# Patient Record
Sex: Female | Born: 1935
Health system: Southern US, Community
[De-identification: ages and names within clinical notes are randomized; demographics above are authoritative.]

## PROBLEM LIST (undated history)

## (undated) DIAGNOSIS — K5900 Constipation, unspecified: Secondary | ICD-10-CM

## (undated) DIAGNOSIS — Z86010 Personal history of colonic polyps: Secondary | ICD-10-CM

## (undated) DIAGNOSIS — C541 Malignant neoplasm of endometrium: Secondary | ICD-10-CM

## (undated) DIAGNOSIS — L820 Inflamed seborrheic keratosis: Secondary | ICD-10-CM

## (undated) DIAGNOSIS — Z800 Family history of malignant neoplasm of digestive organs: Secondary | ICD-10-CM

## (undated) DIAGNOSIS — Z923 Personal history of irradiation: Secondary | ICD-10-CM

## (undated) DIAGNOSIS — H9190 Unspecified hearing loss, unspecified ear: Secondary | ICD-10-CM

## (undated) DIAGNOSIS — J309 Allergic rhinitis, unspecified: Secondary | ICD-10-CM

## (undated) DIAGNOSIS — N952 Postmenopausal atrophic vaginitis: Secondary | ICD-10-CM

## (undated) DIAGNOSIS — N939 Abnormal uterine and vaginal bleeding, unspecified: Secondary | ICD-10-CM

## (undated) DIAGNOSIS — K5730 Diverticulosis of large intestine without perforation or abscess without bleeding: Secondary | ICD-10-CM

## (undated) HISTORY — DX: Diverticulosis of large intestine without perforation or abscess without bleeding: K57.30

## (undated) HISTORY — DX: Personal history of colonic polyps: Z86.010

## (undated) HISTORY — DX: Allergic rhinitis, unspecified: J30.9

## (undated) HISTORY — DX: Family history of malignant neoplasm of digestive organs: Z80.0

## (undated) HISTORY — DX: Postmenopausal atrophic vaginitis: N95.2

## (undated) HISTORY — DX: Unspecified hearing loss, unspecified ear: H91.90

## (undated) HISTORY — PX: RHINOPLASTY: SHX2354

## (undated) HISTORY — PX: COSMETIC SURGERY: SHX468

## (undated) HISTORY — PX: DILATION AND CURETTAGE OF UTERUS: SHX78

## (undated) HISTORY — PX: EYELID IMPLANT: SHX5294

## (undated) HISTORY — DX: Abnormal uterine and vaginal bleeding, unspecified: N93.9

## (undated) HISTORY — DX: Personal history of irradiation: Z92.3

## (undated) HISTORY — DX: Constipation, unspecified: K59.00

## (undated) HISTORY — PX: BREAST BIOPSY: SHX20

## (undated) HISTORY — PX: TONSILECTOMY/ADENOIDECTOMY WITH MYRINGOTOMY: SHX6125

## (undated) HISTORY — DX: Inflamed seborrheic keratosis: L82.0

---

## 1998-09-13 ENCOUNTER — Other Ambulatory Visit: Admission: RE | Admit: 1998-09-13 | Discharge: 1998-09-13 | Payer: Self-pay | Admitting: Obstetrics and Gynecology

## 1999-09-12 ENCOUNTER — Encounter: Admission: RE | Admit: 1999-09-12 | Discharge: 1999-09-12 | Payer: Self-pay | Admitting: Obstetrics and Gynecology

## 1999-09-12 ENCOUNTER — Encounter: Payer: Self-pay | Admitting: Obstetrics and Gynecology

## 1999-09-16 ENCOUNTER — Other Ambulatory Visit: Admission: RE | Admit: 1999-09-16 | Discharge: 1999-09-16 | Payer: Self-pay | Admitting: Obstetrics and Gynecology

## 2000-08-27 ENCOUNTER — Encounter: Admission: RE | Admit: 2000-08-27 | Discharge: 2000-08-27 | Payer: Self-pay | Admitting: Obstetrics and Gynecology

## 2000-08-27 ENCOUNTER — Encounter: Payer: Self-pay | Admitting: Obstetrics and Gynecology

## 2000-09-24 ENCOUNTER — Other Ambulatory Visit: Admission: RE | Admit: 2000-09-24 | Discharge: 2000-09-24 | Payer: Self-pay | Admitting: Obstetrics and Gynecology

## 2001-05-11 ENCOUNTER — Emergency Department (HOSPITAL_COMMUNITY): Admission: EM | Admit: 2001-05-11 | Discharge: 2001-05-11 | Payer: Self-pay | Admitting: Emergency Medicine

## 2001-05-14 ENCOUNTER — Encounter (HOSPITAL_COMMUNITY): Admission: RE | Admit: 2001-05-14 | Discharge: 2001-08-12 | Payer: Self-pay | Admitting: Emergency Medicine

## 2001-08-30 ENCOUNTER — Encounter: Admission: RE | Admit: 2001-08-30 | Discharge: 2001-08-30 | Payer: Self-pay | Admitting: Family Medicine

## 2001-08-30 ENCOUNTER — Encounter: Payer: Self-pay | Admitting: Family Medicine

## 2001-09-16 ENCOUNTER — Other Ambulatory Visit: Admission: RE | Admit: 2001-09-16 | Discharge: 2001-09-16 | Payer: Self-pay | Admitting: Family Medicine

## 2002-09-12 ENCOUNTER — Encounter: Payer: Self-pay | Admitting: Family Medicine

## 2002-09-12 ENCOUNTER — Encounter: Admission: RE | Admit: 2002-09-12 | Discharge: 2002-09-12 | Payer: Self-pay | Admitting: Family Medicine

## 2002-12-22 ENCOUNTER — Other Ambulatory Visit: Admission: RE | Admit: 2002-12-22 | Discharge: 2002-12-22 | Payer: Self-pay | Admitting: Family Medicine

## 2003-09-21 ENCOUNTER — Encounter: Admission: RE | Admit: 2003-09-21 | Discharge: 2003-09-21 | Payer: Self-pay | Admitting: Family Medicine

## 2004-01-01 ENCOUNTER — Other Ambulatory Visit: Admission: RE | Admit: 2004-01-01 | Discharge: 2004-01-01 | Payer: Self-pay | Admitting: Family Medicine

## 2004-09-25 ENCOUNTER — Ambulatory Visit: Payer: Self-pay | Admitting: Family Medicine

## 2004-11-13 ENCOUNTER — Encounter: Admission: RE | Admit: 2004-11-13 | Discharge: 2004-11-13 | Payer: Self-pay | Admitting: Family Medicine

## 2004-12-26 ENCOUNTER — Ambulatory Visit: Payer: Self-pay | Admitting: Family Medicine

## 2005-01-07 ENCOUNTER — Other Ambulatory Visit: Admission: RE | Admit: 2005-01-07 | Discharge: 2005-01-07 | Payer: Self-pay | Admitting: Family Medicine

## 2005-01-07 ENCOUNTER — Ambulatory Visit: Payer: Self-pay | Admitting: Family Medicine

## 2005-09-03 ENCOUNTER — Ambulatory Visit: Payer: Self-pay | Admitting: Family Medicine

## 2005-09-13 ENCOUNTER — Ambulatory Visit: Payer: Self-pay | Admitting: Family Medicine

## 2005-11-11 ENCOUNTER — Ambulatory Visit: Payer: Self-pay | Admitting: Gastroenterology

## 2005-11-18 ENCOUNTER — Encounter: Admission: RE | Admit: 2005-11-18 | Discharge: 2005-11-18 | Payer: Self-pay | Admitting: Family Medicine

## 2005-11-19 ENCOUNTER — Encounter (INDEPENDENT_AMBULATORY_CARE_PROVIDER_SITE_OTHER): Payer: Self-pay | Admitting: Specialist

## 2005-11-19 ENCOUNTER — Ambulatory Visit: Payer: Self-pay | Admitting: Gastroenterology

## 2005-11-19 DIAGNOSIS — Z860100 Personal history of colon polyps, unspecified: Secondary | ICD-10-CM | POA: Insufficient documentation

## 2005-11-19 HISTORY — DX: Personal history of colon polyps, unspecified: Z86.0100

## 2005-11-19 HISTORY — DX: Personal history of colonic polyps: Z86.010

## 2006-01-08 ENCOUNTER — Other Ambulatory Visit: Admission: RE | Admit: 2006-01-08 | Discharge: 2006-01-08 | Payer: Self-pay | Admitting: Family Medicine

## 2006-01-08 ENCOUNTER — Encounter: Payer: Self-pay | Admitting: Family Medicine

## 2006-01-08 ENCOUNTER — Ambulatory Visit: Payer: Self-pay | Admitting: Family Medicine

## 2006-01-13 ENCOUNTER — Ambulatory Visit: Payer: Self-pay | Admitting: Internal Medicine

## 2006-11-27 ENCOUNTER — Encounter: Admission: RE | Admit: 2006-11-27 | Discharge: 2006-11-27 | Payer: Self-pay | Admitting: Family Medicine

## 2007-01-14 ENCOUNTER — Encounter: Payer: Self-pay | Admitting: Family Medicine

## 2007-01-14 ENCOUNTER — Ambulatory Visit: Payer: Self-pay | Admitting: Family Medicine

## 2007-01-14 ENCOUNTER — Other Ambulatory Visit: Admission: RE | Admit: 2007-01-14 | Discharge: 2007-01-14 | Payer: Self-pay | Admitting: Family Medicine

## 2007-01-14 LAB — CONVERTED CEMR LAB
ALT: 32 units/L (ref 0–40)
Albumin: 3.2 g/dL — ABNORMAL LOW (ref 3.5–5.2)
Alkaline Phosphatase: 64 units/L (ref 39–117)
BUN: 6 mg/dL (ref 6–23)
Calcium: 9.3 mg/dL (ref 8.4–10.5)
Cholesterol: 166 mg/dL (ref 0–200)
HCT: 33.2 % — ABNORMAL LOW (ref 36.0–46.0)
Hgb A1c MFr Bld: 6 % (ref 4.6–6.0)
Lymphocytes Relative: 14.5 % (ref 12.0–46.0)
MCV: 94 fL (ref 78.0–100.0)
Monocytes Absolute: 0.6 10*3/uL (ref 0.2–0.7)
Monocytes Relative: 6.1 % (ref 3.0–11.0)
Neutro Abs: 7.6 10*3/uL (ref 1.4–7.7)
RDW: 12.7 % (ref 11.5–14.6)
Sodium: 140 meq/L (ref 135–145)
Total Bilirubin: 0.7 mg/dL (ref 0.3–1.2)
Total Protein: 7.5 g/dL (ref 6.0–8.3)
Triglycerides: 81 mg/dL (ref 0–149)
VLDL: 16 mg/dL (ref 0–40)

## 2007-03-09 ENCOUNTER — Ambulatory Visit: Payer: Self-pay | Admitting: Family Medicine

## 2007-03-09 LAB — CONVERTED CEMR LAB
Basophils Relative: 1.1 % — ABNORMAL HIGH (ref 0.0–1.0)
Eosinophils Relative: 4.2 % (ref 0.0–5.0)
HCT: 32.8 % — ABNORMAL LOW (ref 36.0–46.0)
MCHC: 35.3 g/dL (ref 30.0–36.0)
MCV: 93.4 fL (ref 78.0–100.0)
Monocytes Absolute: 0.5 10*3/uL (ref 0.2–0.7)
Platelets: 228 10*3/uL (ref 150–400)
RBC: 3.51 M/uL — ABNORMAL LOW (ref 3.87–5.11)

## 2007-03-16 ENCOUNTER — Ambulatory Visit: Payer: Self-pay | Admitting: Family Medicine

## 2007-03-16 LAB — CONVERTED CEMR LAB
Eosinophils Relative: 3.2 % (ref 0.0–5.0)
MCHC: 34.7 g/dL (ref 30.0–36.0)
Monocytes Relative: 7.4 % (ref 3.0–11.0)
Neutrophils Relative %: 64.1 % (ref 43.0–77.0)
RDW: 12.9 % (ref 11.5–14.6)

## 2007-03-23 ENCOUNTER — Ambulatory Visit: Payer: Self-pay | Admitting: Family Medicine

## 2007-07-07 DIAGNOSIS — J309 Allergic rhinitis, unspecified: Secondary | ICD-10-CM

## 2007-07-07 HISTORY — DX: Allergic rhinitis, unspecified: J30.9

## 2007-09-17 ENCOUNTER — Ambulatory Visit: Payer: Self-pay | Admitting: Family Medicine

## 2007-12-22 ENCOUNTER — Encounter: Admission: RE | Admit: 2007-12-22 | Discharge: 2007-12-22 | Payer: Self-pay | Admitting: Family Medicine

## 2008-01-17 ENCOUNTER — Other Ambulatory Visit: Admission: RE | Admit: 2008-01-17 | Discharge: 2008-01-17 | Payer: Self-pay | Admitting: Family Medicine

## 2008-01-17 ENCOUNTER — Encounter: Payer: Self-pay | Admitting: Family Medicine

## 2008-01-17 ENCOUNTER — Ambulatory Visit: Payer: Self-pay | Admitting: Family Medicine

## 2008-01-17 DIAGNOSIS — N952 Postmenopausal atrophic vaginitis: Secondary | ICD-10-CM

## 2008-01-17 HISTORY — DX: Postmenopausal atrophic vaginitis: N95.2

## 2008-01-17 LAB — CONVERTED CEMR LAB
AST: 26 units/L (ref 0–37)
Albumin: 3.8 g/dL (ref 3.5–5.2)
Basophils Relative: 0.8 % (ref 0.0–1.0)
Blood in Urine, dipstick: NEGATIVE
Calcium: 9.6 mg/dL (ref 8.4–10.5)
Eosinophils Absolute: 0.4 10*3/uL (ref 0.0–0.6)
GFR calc Af Amer: 80 mL/min
GFR calc non Af Amer: 66 mL/min
Glucose, Bld: 97 mg/dL (ref 70–99)
HDL: 39.5 mg/dL (ref >39.0)
Hemoglobin: 12.1 g/dL (ref 12.0–15.0)
Ketones, urine, test strip: NEGATIVE
LDL Cholesterol: 127 mg/dL — ABNORMAL HIGH (ref 0–99)
MCHC: 33.6 g/dL (ref 30.0–36.0)
Monocytes Absolute: 0.5 10*3/uL (ref 0.2–0.7)
Monocytes Relative: 8.5 % (ref 3.0–11.0)
Neutro Abs: 3.5 10*3/uL (ref 1.4–7.7)
Neutrophils Relative %: 60.9 % (ref 43.0–77.0)
Nitrite: NEGATIVE
Protein, U semiquant: NEGATIVE
Specific Gravity, Urine: 1.02
VLDL: 23 mg/dL (ref 0–40)
WBC: 5.7 10*3/uL (ref 4.5–10.5)

## 2008-03-20 ENCOUNTER — Ambulatory Visit: Payer: Self-pay | Admitting: Family Medicine

## 2008-03-21 DIAGNOSIS — L820 Inflamed seborrheic keratosis: Secondary | ICD-10-CM | POA: Insufficient documentation

## 2008-03-21 HISTORY — DX: Inflamed seborrheic keratosis: L82.0

## 2008-05-17 ENCOUNTER — Encounter: Payer: Self-pay | Admitting: Family Medicine

## 2008-05-17 ENCOUNTER — Ambulatory Visit: Payer: Self-pay | Admitting: Family Medicine

## 2008-06-08 ENCOUNTER — Telehealth: Payer: Self-pay | Admitting: Family Medicine

## 2008-07-12 DIAGNOSIS — K5730 Diverticulosis of large intestine without perforation or abscess without bleeding: Secondary | ICD-10-CM | POA: Insufficient documentation

## 2008-07-12 HISTORY — DX: Diverticulosis of large intestine without perforation or abscess without bleeding: K57.30

## 2008-07-13 ENCOUNTER — Ambulatory Visit: Payer: Self-pay | Admitting: Gastroenterology

## 2008-07-13 DIAGNOSIS — R1031 Right lower quadrant pain: Secondary | ICD-10-CM | POA: Insufficient documentation

## 2008-07-14 ENCOUNTER — Ambulatory Visit (HOSPITAL_COMMUNITY): Admission: RE | Admit: 2008-07-14 | Discharge: 2008-07-14 | Payer: Self-pay | Admitting: Gastroenterology

## 2008-07-19 ENCOUNTER — Ambulatory Visit: Payer: Self-pay | Admitting: Gastroenterology

## 2008-08-21 ENCOUNTER — Ambulatory Visit: Payer: Self-pay | Admitting: Family Medicine

## 2008-08-21 DIAGNOSIS — Z8679 Personal history of other diseases of the circulatory system: Secondary | ICD-10-CM | POA: Insufficient documentation

## 2008-10-03 ENCOUNTER — Telehealth: Payer: Self-pay | Admitting: Family Medicine

## 2008-12-22 ENCOUNTER — Encounter: Admission: RE | Admit: 2008-12-22 | Discharge: 2008-12-22 | Payer: Self-pay | Admitting: Family Medicine

## 2009-01-25 ENCOUNTER — Encounter: Payer: Self-pay | Admitting: Family Medicine

## 2009-01-25 ENCOUNTER — Other Ambulatory Visit: Admission: RE | Admit: 2009-01-25 | Discharge: 2009-01-25 | Payer: Self-pay | Admitting: Family Medicine

## 2009-01-25 ENCOUNTER — Ambulatory Visit: Payer: Self-pay | Admitting: Family Medicine

## 2009-01-25 LAB — CONVERTED CEMR LAB
Bilirubin Urine: NEGATIVE
Blood in Urine, dipstick: NEGATIVE
Glucose, Urine, Semiquant: NEGATIVE
Ketones, urine, test strip: NEGATIVE
Nitrite: NEGATIVE
Urobilinogen, UA: 0.2

## 2009-01-26 ENCOUNTER — Telehealth: Payer: Self-pay | Admitting: *Deleted

## 2009-02-05 LAB — CONVERTED CEMR LAB
AST: 26 units/L (ref 0–37)
Albumin: 3.8 g/dL (ref 3.5–5.2)
Alkaline Phosphatase: 58 units/L (ref 39–117)
Basophils Absolute: 0.1 10*3/uL (ref 0.0–0.1)
Bilirubin, Direct: 0.1 mg/dL (ref 0.0–0.3)
Calcium: 9.4 mg/dL (ref 8.4–10.5)
Eosinophils Absolute: 0.3 10*3/uL (ref 0.0–0.7)
Eosinophils Relative: 3.4 % (ref 0.0–5.0)
GFR calc non Af Amer: 75.07 mL/min (ref >60)
HCT: 35.5 % — ABNORMAL LOW (ref 36.0–46.0)
HDL: 41.1 mg/dL (ref >39.00)
LDL Cholesterol: 137 mg/dL — ABNORMAL HIGH (ref 0–99)
MCHC: 35.7 g/dL (ref 30.0–36.0)
MCV: 90.5 fL (ref 78.0–100.0)
Monocytes Relative: 6.8 % (ref 3.0–12.0)
RBC: 3.92 M/uL (ref 3.87–5.11)
Sodium: 142 meq/L (ref 135–145)
Total Bilirubin: 0.6 mg/dL (ref 0.3–1.2)
Total CHOL/HDL Ratio: 5
Total Protein: 7.3 g/dL (ref 6.0–8.3)
Triglycerides: 96 mg/dL (ref 0.0–149.0)
VLDL: 19.2 mg/dL (ref 0.0–40.0)
WBC: 7.8 10*3/uL (ref 4.5–10.5)

## 2009-06-18 DIAGNOSIS — K1120 Sialoadenitis, unspecified: Secondary | ICD-10-CM | POA: Insufficient documentation

## 2009-06-19 ENCOUNTER — Ambulatory Visit: Payer: Self-pay | Admitting: Family Medicine

## 2009-08-13 ENCOUNTER — Ambulatory Visit: Payer: Self-pay | Admitting: Family Medicine

## 2009-10-29 ENCOUNTER — Telehealth: Payer: Self-pay | Admitting: Family Medicine

## 2009-12-25 ENCOUNTER — Encounter: Admission: RE | Admit: 2009-12-25 | Discharge: 2009-12-25 | Payer: Self-pay | Admitting: Family Medicine

## 2009-12-31 ENCOUNTER — Ambulatory Visit: Payer: Self-pay | Admitting: Internal Medicine

## 2009-12-31 ENCOUNTER — Telehealth: Payer: Self-pay | Admitting: Gastroenterology

## 2009-12-31 DIAGNOSIS — R1032 Left lower quadrant pain: Secondary | ICD-10-CM | POA: Insufficient documentation

## 2009-12-31 DIAGNOSIS — K5900 Constipation, unspecified: Secondary | ICD-10-CM | POA: Insufficient documentation

## 2009-12-31 HISTORY — DX: Constipation, unspecified: K59.00

## 2010-02-05 ENCOUNTER — Telehealth: Payer: Self-pay | Admitting: *Deleted

## 2010-02-05 ENCOUNTER — Ambulatory Visit: Payer: Self-pay | Admitting: Family Medicine

## 2010-02-05 ENCOUNTER — Other Ambulatory Visit: Admission: RE | Admit: 2010-02-05 | Discharge: 2010-02-05 | Payer: Self-pay | Admitting: Family Medicine

## 2010-02-08 LAB — CONVERTED CEMR LAB
ALT: 23 units/L (ref 0–35)
AST: 24 units/L (ref 0–37)
Albumin: 3.8 g/dL (ref 3.5–5.2)
Alkaline Phosphatase: 48 units/L (ref 39–117)
BUN: 11 mg/dL (ref 6–23)
Basophils Absolute: 0 10*3/uL (ref 0.0–0.1)
CO2: 31 meq/L (ref 19–32)
Calcium: 9.2 mg/dL (ref 8.4–10.5)
Eosinophils Absolute: 0.2 10*3/uL (ref 0.0–0.7)
Glucose, Bld: 94 mg/dL (ref 70–99)
HCT: 34.7 % — ABNORMAL LOW (ref 36.0–46.0)
HDL: 44.3 mg/dL (ref >39.00)
Lymphocytes Relative: 22.4 % (ref 12.0–46.0)
MCV: 95.5 fL (ref 78.0–100.0)
Neutro Abs: 3.8 10*3/uL (ref 1.4–7.7)
Neutrophils Relative %: 64.8 % (ref 43.0–77.0)
Platelets: 201 10*3/uL (ref 150.0–400.0)
Potassium: 4 meq/L (ref 3.5–5.1)
RBC: 3.63 M/uL — ABNORMAL LOW (ref 3.87–5.11)
RDW: 12.1 % (ref 11.5–14.6)
Sodium: 145 meq/L (ref 135–145)
Total Bilirubin: 0.3 mg/dL (ref 0.3–1.2)
Total CHOL/HDL Ratio: 4
VLDL: 16.6 mg/dL (ref 0.0–40.0)

## 2010-03-22 ENCOUNTER — Telehealth: Payer: Self-pay | Admitting: Family Medicine

## 2010-05-17 ENCOUNTER — Ambulatory Visit: Payer: Self-pay | Admitting: Family Medicine

## 2010-08-07 ENCOUNTER — Ambulatory Visit: Payer: Self-pay | Admitting: Family Medicine

## 2010-09-10 ENCOUNTER — Encounter: Payer: Self-pay | Admitting: Family Medicine

## 2010-12-03 ENCOUNTER — Other Ambulatory Visit: Payer: Self-pay | Admitting: Family Medicine

## 2010-12-03 DIAGNOSIS — Z1239 Encounter for other screening for malignant neoplasm of breast: Secondary | ICD-10-CM

## 2010-12-12 ENCOUNTER — Telehealth: Payer: Self-pay | Admitting: *Deleted

## 2010-12-12 NOTE — Miscellaneous (Signed)
Summary: BONE DENSITY  Clinical Lists Changes  Orders: Added new Test order of T-Bone Densitometry (77080) - Signed Added new Test order of T-Lumbar Vertebral Assessment (77082) - Signed 

## 2010-12-12 NOTE — Progress Notes (Signed)
Summary: Shingles Vaccine  Phone Note Call from Patient Call back at [phone]   Caller: Patient Summary of Call: Would like to speak w/ nurse about the shingles vaccine. Initial call taken by: Trixie Dredge,  Mar 22, 2010 10:49 AM  Follow-up for Phone Call        informed patient that shingles vaccines are not avaiable at this time left message on machine  Follow-up by: Kern Reap CMA Duncan Dull),  Mar 22, 2010 3:32 PM

## 2010-12-12 NOTE — Assessment & Plan Note (Signed)
Summary: FLU SHOT/NJR  Nurse Visit  CC: Flu shot./kb   Allergies: 1)  ! Sulfa  Orders Added: 1)  Flu Vaccine 63yrs + MEDICARE PATIENTS [Q2039] 2)  Administration Flu vaccine - MCR [G0008]            Flu Vaccine Consent Questions     Do you have a history of severe allergic reactions to this vaccine? no    Any prior history of allergic reactions to egg and/or gelatin? no    Do you have a sensitivity to the preservative Thimersol? no    Do you have a past history of Guillan-Barre Syndrome? no    Do you currently have an acute febrile illness? no    Have you ever had a severe reaction to latex? no    Vaccine information given and explained to patient? yes    Are you currently pregnant? no    Lot Number: [id number]   Exp Date:05/10/2011   Site Given  Left Deltoid IMu

## 2010-12-12 NOTE — Telephone Encounter (Signed)
Left message to call back with elevated pulse rate, and more details about her reason for appointment request.

## 2010-12-12 NOTE — Progress Notes (Signed)
Summary: Ask a question  Phone Note Call from Patient Call back at cell [phone]   Call For: Dr Jarold Motto Reason for Call: Talk to Nurse Summary of Call: Wants to ask a question to nurse directly. Initial call taken by: Leanor Kail [facility],  December 31, 2009 8:33 AM  Follow-up for Phone Call        Pt C/O LLU pain over the weekend.  Pain was severe at times.  Feels better today but sheis going out of town tomorrow and would like to be checked.  Appt sch with Mike Gip, PA. Follow-up by: Ashok Cordia RN,  December 31, 2009 8:42 AM

## 2010-12-12 NOTE — Telephone Encounter (Signed)
Pulse rate 107-110  On Zyrtec D at one point, but none for 2 days. Should she come in for EKG?  Per Pt.

## 2010-12-12 NOTE — Assessment & Plan Note (Signed)
Summary: LLQ pain/dfs   History of Present Illness Visit Type: Follow-up Visit Primary GI MD: Sheryn Bison MD FACP FAGA Primary Provider:  Governor Specking, MD Chief Complaint: LLQ abd discomfort that started last Friday that increasingly got better throughout the weekend. Pt states she started a high fiber diet and drank more fluids. Pt states she several small BM's over the weekend and then yesterday had a significant BM. Pt states the pain is almost gone away and is feeling somewhat better. Pt denies any fever. History of Present Illness:   76 YO FEMALE KNOWN TO DR. PATTERSON WITH HX OF DIVERTICULOSIS AND ADENOMATOUS COLON POLYPS.LAST COLONOSCOPY WAS IN 1/07-ONE ADENOMATOUS POLYPD REMOVED. SHE COMES IN TODAY WITH C/O LLQ PAIN,ONSET LAST WEEK. SHE SAYS SHE WOKE UP WITH PAIN ON FRIDAY 2/18,AND HAD PERSISTENT PAIN THRU 2/19,THEN FELT BETTER YESTERDAY. SHE HAD BEEN SOMEWHAT CONSTIPATED,STARTED PUSHING FLUIDS AND FIBER AND AFTER HAVING SOME BM'S HAS FELT BETTER. SHE DENIES FEVER,CHILLS ETC. NO NAUSEA,VOMITING.NO MELENA OR HEME. SHE WAS CONCERNED AS SHE IS GOING OUT OF TOWN THIS WEEK. PAIN WAS ACHY,NO DYSURIA.   GI Review of Systems    Reports abdominal pain.     Location of  Abdominal pain: LLQ.    Denies acid reflux, belching, bloating, chest pain, dysphagia with liquids, dysphagia with solids, heartburn, loss of appetite, nausea, vomiting, vomiting blood, weight loss, and  weight gain.      Reports constipation and  diverticulosis.     Denies anal fissure, black tarry stools, change in bowel habit, diarrhea, fecal incontinence, heme positive stool, hemorrhoids, irritable bowel syndrome, jaundice, light color stool, liver problems, rectal bleeding, and  rectal pain.    Current Medications (verified): 1)  Multivitamins   Tabs (Multiple Vitamin) .... Take 1 Tablet By Mouth Once A Day 2)  Calcium Citrate-Vitamin D 1500-200 Mg-Unit Tabs (Calcium Citrate-Vitamin D) .... Take 1 Tablet By Mouth Once A  Day 3)  Nasonex 50 Mcg/act Susp (Mometasone Furoate) .... Insert 1 Puff Into Each Nostril Once Daily 4)  Astelin 137 Mcg/spray Soln (Azelastine Hcl) .... Insert 1 Spray in Each Nostril Once Daily 5)  Osteo Bi-Flex Regular Strength 250-200 Mg Tabs (Glucosamine-Chondroitin) .... Take 1 Tablet By Mouth Once A Day 6)  Premarin 0.625 Mg/gm Crea (Estrogens, Conjugated) .... Apply 0.5 G Twice Weekly  Allergies (verified): 1)  ! Sulfa  Past History:  Past Medical History: Reviewed history from 07/13/2008 and no changes required. Allergic rhinitis bilateral cataracts, and lens implants childbirth x 2 rhinoplasty D&C  Current Problems:  ABDOMINAL PAIN, RIGHT LOWER QUADRANT (ICD-789.03) NEOPLASM, COLON, FAMILY HX (ICD-V16.0) DIVERTICULOSIS, COLON (ICD-562.10) COLONIC POLYPS, ADENOMATOUS, HX OF (ICD-V12.72) SEBORRHEIC KERATOSIS, INFLAMED (ICD-702.11) SENILE VAGINITIS (ICD-627.3) FAMILY HISTORY OF CAD FEMALE 1ST DEGREE RELATIVE <50 (ICD-V17.3) ALLERGIC RHINITIS (ICD-477.9)  Past Surgical History: Reviewed history from 07/13/2008 and no changes required. Childbirth x 2 Rhinoplasty D/C R + L implants-eyes Colonoscopy-11/19/2005  Family History: Reviewed history from 07/13/2008 and no changes required. Family History of CAD Female 1st degree relative <50 Family History Other cancer-Colon: Mother  Social History: Former Smoker-stopped 45 years ago Alcohol use-no Widow and remarried Occupation: Retired Daily Caffeine Use-1/2 cup daily Illicit Drug Use - no Patient gets regular exercise.  Review of Systems  The patient denies allergy/sinus, anemia, anxiety-new, arthritis/joint pain, back pain, blood in urine, breast changes/lumps, change in vision, confusion, cough, coughing up blood, depression-new, fainting, fatigue, fever, headaches-new, hearing problems, heart murmur, heart rhythm changes, itching, menstrual pain, muscle pains/cramps, night sweats, nosebleeds, pregnancy symptoms,  shortness of breath, skin rash, sleeping problems, sore throat, swelling of feet/legs, swollen lymph glands, thirst - excessive , urination - excessive , urination changes/pain, urine leakage, vision changes, and voice change.         ROS OTHERWISE AS IN HPI  Vital Signs:  Patient profile:   76 year old female Height:      68 inches Weight:      157.13 pounds BMI:     23.98 Pulse rate:   100 / minute Pulse rhythm:   regular BP sitting:   128 / 70  (left arm) Cuff size:   regular  Vitals Entered By: Christie Nottingham CMA Duncan Dull) (December 31, 2009 10:19 AM)  Physical Exam  General:  Well developed, well nourished, no acute distress. Head:  Normocephalic and atraumatic. Eyes:  PERRLA, no icterus. Lungs:  Clear throughout to auscultation. Heart:  Regular rate and rhythm; no murmurs, rubs,  or bruits. Abdomen:  SOFT, NONTENDER, NO MASS OR HSM,BS+ Rectal:  NOT DONE Extremities:  No clubbing, cyanosis, edema or deformities noted. Neurologic:  Alert and  oriented x4;  grossly normal neurologically. Psych:  Alert and cooperative. Normal mood and affect.   Impression & Recommendations:  Problem # 1:  ABDOMINAL PAIN-LLQ (ICD-789.04) Assessment New 76 YO FEMALE WITH HX OF DIVERTICULOSIS AND ADENOMATOUS COLON POLYPS,WITH 2 DAY HX OF LLQ PAIN WHICH HAS IMPROVED. SUSPECT SHE DID HAVE SOME LOW GRADE DIVERTICULITIS.  CONTINUE LIBERAL FLUIDS,HIGH FIBER DIET;ADD BENEFIBER DAILY OR FIGS/PRUNES DAILY WILL CALL IN  CIPRO 500 MG TWICE DAILY X 7 DAYS-SHE WILL HAVE IT ON HAND IF SXS RECUR OVER THIS NEXT WEEK WHILE SHE IS OUT OF TOWN FOLLOW UP WITH DR. PATTERSON AS NEEDED.  Problem # 2:  NEOPLASM, COLON, FAMILY HX (ICD-V16.0) Assessment: Comment Only DUE FOR FOLLOW UP COLONOSCOPY1/2012  Problem # 3:  DIVERTICULOSIS, COLON (ICD-562.10) Assessment: Comment Only  Problem # 4:  COLONIC POLYPS, ADENOMATOUS, HX OF (ICD-V12.72) Assessment: Comment Only DUE FOR FOLLOW UP COLONOSCOPY 11/2010  Patient  Instructions: 1)  Pick up your prescription at your pharmacy. 2)  Start Benefiber once daily. 3)  Please schedule a follow-up appointment as needed with Dr. Jarold Motto.  4)  Copy sent to : Kelle Darting, MD 5)  The medication list was reviewed and reconciled.  All changed / newly prescribed medications were explained.  A complete medication list was provided to the patient / caregiver. Prescriptions: CIPRO 500 MG TABS (CIPROFLOXACIN HCL) one tablet by mouth two times a day  #14 x 0   Entered by:   Lowry Ram NCMA   Authorized by:   Sammuel Cooper PA-c   Signed by:   Lowry Ram NCMA on 12/31/2009   Method used:   Electronically to        [facility] [phone]* (retail)       [address] [place], [place]  [address]       Ph: [phone] or [phone]       Fax: [phone]   RxID:   [id number]

## 2010-12-12 NOTE — Assessment & Plan Note (Signed)
Summary: shingles shot with Rachel//ccm  Nurse Visit   Allergies: 1)  ! Sulfa  Immunizations Administered:  Zostavax # 1:    Vaccine Type: Zostavax    Site: right deltoid    Mfr: Merck    Dose: 0.65    Route: Graves    Given by: Kern Reap CMA (AAMA)    Exp. Date: 06/05/2011    Lot #: [id number]    Physician counseled: yes  Orders Added: 1)  Zoster (Shingles) Vaccine Live [90736] 2)  Admin 1st Vaccine [phone]

## 2010-12-12 NOTE — Progress Notes (Signed)
Summary: shingles vaccine  Phone Note Call from Patient   Caller: Patient Call For: Roderick Pee MD Summary of Call: Pt would like [facility] Contras to order the shingles vaccine, please. [phone] Initial call taken by: Lynann Beaver CMA,  February 05, 2010 3:45 PM  Follow-up for Phone Call        vaccine ordered and will call patient when vaccine comes to office. Follow-up by: Kern Reap CMA Duncan Dull),  February 08, 2010 5:17 PM

## 2010-12-12 NOTE — Assessment & Plan Note (Signed)
Summary: EMP/PT COMING IN FASTING/CJR   Vital Signs:  Patient profile:   76 year old female Height:      68 inches Weight:      157 pounds Temp:     98.0 degrees F oral BP sitting:   120 / 80  (left arm) Cuff size:   regular  Vitals Entered By: Kern Reap CMA Duncan Dull) (February 05, 2010 10:47 AM) CC: cpx Is Patient Diabetic? No Pain Assessment Patient in pain? no        Primary Care Provider:   Governor Specking, MD  CC:  cpx.  History of Present Illness:  Sheila Armstrong is a 76 year old, married female, nonsmoker, who comes in today for evaluation of allergic rhinitis postmenopausal vaginal dryness and a general physical exam.  For her allergic rhinitis.  She uses steroid nasal spray and Astelin nasal spray.  Uses Premarin vaginal cream once weekly for vaginal dryness.  She has a history of thoracic scoliosis.  She recently went to see her orthopedist.  They recommend she take an anti-inflammatory, however, she declined, because she's had a previous GI bleed from aspirin.  Advised only to take Tylenol in the future.  Her past medical history, social history, family history reviewed in detail.  No changes.  She continues to remain physically active.  She walks 30 minutes daily.  Her mood is good.  Hearing is normal with her hearing aids.  Risk of fall is minimal home safety reviewed.  Height weight, normal.  Visual acuity normal routine screening for glaucoma annually.  She was counseled concerning diet, exercise, and medication.  Appropriate labs will be ordered.  She gets routine eye care and dental care.  Colonoscopy done in GI.  She does BSE monthly and gets any mammography.  Tetanus 2002, seasonal flu 2010, Pneumovax 2010, contemplating shingles  Allergies: 1)  ! Sulfa  Past History:  Past medical, surgical, family and social histories (including risk factors) reviewed, and no changes noted (except as noted below).  Past Medical History: Reviewed history from 07/13/2008 and no  changes required. Allergic rhinitis bilateral cataracts, and lens implants childbirth x 2 rhinoplasty D&C  Current Problems:  ABDOMINAL PAIN, RIGHT LOWER QUADRANT (ICD-789.03) NEOPLASM, COLON, FAMILY HX (ICD-V16.0) DIVERTICULOSIS, COLON (ICD-562.10) COLONIC POLYPS, ADENOMATOUS, HX OF (ICD-V12.72) SEBORRHEIC KERATOSIS, INFLAMED (ICD-702.11) SENILE VAGINITIS (ICD-627.3) FAMILY HISTORY OF CAD FEMALE 1ST DEGREE RELATIVE <50 (ICD-V17.3) ALLERGIC RHINITIS (ICD-477.9)  Past Surgical History: Reviewed history from 07/13/2008 and no changes required. Childbirth x 2 Rhinoplasty D/C R + L implants-eyes Colonoscopy-11/19/2005  Family History: Reviewed history from 07/13/2008 and no changes required. Family History of CAD Female 1st degree relative <50 Family History Other cancer-Colon: Mother  Social History: Reviewed history from 12/31/2009 and no changes required. Former Smoker-stopped 45 years ago Alcohol use-no Widow and remarried Occupation: Retired Daily Caffeine Use-1/2 cup daily Illicit Drug Use - no Patient gets regular exercise.  Review of Systems      See HPI  Physical Exam  General:  Well-developed,well-nourished,in no acute distress; alert,appropriate and cooperative throughout examination Head:  Normocephalic and atraumatic without obvious abnormalities. No apparent alopecia or balding. Eyes:  No corneal or conjunctival inflammation noted. EOMI. Perrla. Funduscopic exam benign, without hemorrhages, exudates or papilledema. Vision grossly normal. Ears:  External ear exam shows no significant lesions or deformities.  Otoscopic examination reveals clear canals, tympanic membranes are intact bilaterally without bulging, retraction, inflammation or discharge. Hearing is grossly normal bilaterally. Nose:  External nasal examination shows no deformity or inflammation. Nasal mucosa are  pink and moist without lesions or exudates. Mouth:  Oral mucosa and oropharynx without  lesions or exudates.  Teeth in good repair. Neck:  No deformities, masses, or tenderness noted. Chest Wall:  No deformities, masses, or tenderness noted. Breasts:  No mass, nodules, thickening, tenderness, bulging, retraction, inflamation, nipple discharge or skin changes noted.   Lungs:  Normal respiratory effort, chest expands symmetrically. Lungs are clear to auscultation, no crackles or wheezes. Heart:  Normal rate and regular rhythm. S1 and S2 normal without gallop, murmur, click, rub or other extra sounds. Abdomen:  Bowel sounds positive,abdomen soft and non-tender without masses, organomegaly or hernias noted. Rectal:  No external abnormalities noted. Normal sphincter tone. No rectal masses or tenderness. Genitalia:  Pelvic Exam:        External: normal female genitalia without lesions or masses        Vagina: normal without lesions or masses        Cervix: normal without lesions or masses        Adnexa: normal bimanual exam without masses or fullness        Uterus: normal by palpation        Pap smear: performed Msk:  10 to 15 degrees of thoracic scoliosis Pulses:  R and L carotid,radial,femoral,dorsalis pedis and posterior tibial pulses are full and equal bilaterally Extremities:  No clubbing, cyanosis, edema, or deformity noted with normal full range of motion of all joints.   Neurologic:  No cranial nerve deficits noted. Station and gait are normal. Plantar reflexes are down-going bilaterally. DTRs are symmetrical throughout. Sensory, motor and coordinative functions appear intact. Skin:  Intact without suspicious lesions or rashes Cervical Nodes:  No lymphadenopathy noted Axillary Nodes:  No palpable lymphadenopathy Inguinal Nodes:  No significant adenopathy Psych:  Cognition and judgment appear intact. Alert and cooperative with normal attention span and concentration. No apparent delusions, illusions, hallucinations   Impression & Recommendations:  Problem # 1:  SENILE  VAGINITIS (ICD-627.3) Assessment Improved  Her updated medication list for this problem includes:    Premarin 0.625 Mg/gm Crea (Estrogens, conjugated) .Marland Kitchen... Apply 0.5 g twice weekly  Orders: Venipuncture (16109) TLB-Lipid Panel (80061-LIPID) TLB-BMP (Basic Metabolic Panel-BMET) (80048-METABOL) TLB-CBC Platelet - w/Differential (85025-CBCD) TLB-Hepatic/Liver Function Pnl (80076-HEPATIC) TLB-TSH (Thyroid Stimulating Hormone) (60454-UJW) Prescription Created Electronically [phone]) UA Dipstick w/o Micro (automated)  (81003) Subsequent annual wellness visit with prevention plan (N8295)  Problem # 2:  ALLERGIC RHINITIS (ICD-477.9) Assessment: Improved  Her updated medication list for this problem includes:    Nasonex 50 Mcg/act Susp (Mometasone furoate) ..... Insert 1 puff into each nostril once daily    Astelin 137 Mcg/spray Soln (Azelastine hcl) ..... Insert 1 spray in each nostril once daily  Orders: Venipuncture (62130) TLB-Lipid Panel (80061-LIPID) TLB-BMP (Basic Metabolic Panel-BMET) (80048-METABOL) TLB-CBC Platelet - w/Differential (85025-CBCD) TLB-Hepatic/Liver Function Pnl (80076-HEPATIC) TLB-TSH (Thyroid Stimulating Hormone) (86578-ION) Prescription Created Electronically [phone]) UA Dipstick w/o Micro (automated)  (81003) Subsequent annual wellness visit with prevention plan (W4132)  Problem # 3:  Preventive Health Care (ICD-V70.0) Assessment: Unchanged  Orders: Venipuncture (44010) TLB-Lipid Panel (80061-LIPID) TLB-BMP (Basic Metabolic Panel-BMET) (80048-METABOL) TLB-CBC Platelet - w/Differential (85025-CBCD) TLB-Hepatic/Liver Function Pnl (80076-HEPATIC) TLB-TSH (Thyroid Stimulating Hormone) (27253-GUY) Prescription Created Electronically [phone]) UA Dipstick w/o Micro (automated)  (81003) Subsequent annual wellness visit with prevention plan (Q2595) EKG w/ Interpretation (93000)  Complete Medication List: 1)  Multivitamins Tabs (Multiple vitamin) .... Take 1  tablet by mouth once a day 2)  Calcium Citrate-vitamin D 1500-200 Mg-unit Tabs (Calcium citrate-vitamin d) .Marland KitchenMarland KitchenMarland Kitchen  Take 1 tablet by mouth once a day 3)  Nasonex 50 Mcg/act Susp (Mometasone furoate) .... Insert 1 puff into each nostril once daily 4)  Astelin 137 Mcg/spray Soln (Azelastine hcl) .... Insert 1 spray in each nostril once daily 5)  Osteo Bi-flex Regular Strength 250-200 Mg Tabs (Glucosamine-chondroitin) .... Take 1 tablet by mouth once a day 6)  Premarin 0.625 Mg/gm Crea (Estrogens, conjugated) .... Apply 0.5 g twice weekly 7)  Benefiber Pack (Guar gum) .... One once daily  Patient Instructions: 1)  Please schedule a follow-up appointment in 1 year. 2)  It is important that you exercise regularly at least 20 minutes 5 times a week. If you develop chest pain, have severe difficulty breathing, or feel very tired , stop exercising immediately and seek medical attention. 3)  Schedule your mammogram. 4)  Schedule a colonoscopy/sigmoidoscopy to help detect colon cancer. 5)  Take calcium +Vitamin D daily. Prescriptions: PREMARIN 0.625 MG/GM CREA (ESTROGENS, CONJUGATED) apply 0.5 g twice weekly  #3 tubs x 3   Entered and Authorized by:   Roderick Pee MD   Signed by:   Roderick Pee MD on 02/05/2010   Method used:   Electronically to        [facility] [phone]* (retail)       [address] [place], [place]  [address]       Ph: [phone] or [phone]       Fax: [phone]   RxID:   [id number] ASTELIN 137 MCG/SPRAY SOLN (AZELASTINE HCL) Insert 1 spray in each nostril once daily  #2 units x 6   Entered and Authorized by:   Roderick Pee MD   Signed by:   Roderick Pee MD on 02/05/2010   Method used:   Electronically to        [facility] [phone]* (retail)       [address] [place], [place]  [address]       Ph: [phone] or [phone]       Fax: [phone]   RxID:   [id number] NASONEX 50 MCG/ACT SUSP (MOMETASONE FUROATE) Insert 1 puff into  each nostril once daily  #2 units x 6   Entered and Authorized by:   Roderick Pee MD   Signed by:   Roderick Pee MD on 02/05/2010   Method used:   Electronically to        [facility] [phone]* (retail)       [address] [place], [place]  [address]       Ph: [phone] or [phone]       Fax: [phone]   RxID:   [id number]     Appended Document: EMP/PT COMING IN FASTING/CJR  Laboratory Results   Urine Tests    Routine Urinalysis   Color: yellow Appearance: Clear Glucose: negative   (Normal Range: Negative) Bilirubin: negative   (Normal Range: Negative) Ketone: negative   (Normal Range: Negative) Spec. Gravity: 1.010   (Normal Range: 1.003-1.035) Blood: trace-lysed   (Normal Range: Negative) pH: 6.5   (Normal Range: 5.0-8.0) Protein: negative   (Normal Range: Negative) Urobilinogen: 0.2   (Normal Range: 0-1) Nitrite: negative   (Normal Range: Negative) Leukocyte Esterace: negative   (Normal Range: Negative)    Comments: Rita Ohara  February 05, 2010 1:36 PM

## 2010-12-13 ENCOUNTER — Encounter: Payer: Self-pay | Admitting: Family Medicine

## 2010-12-13 ENCOUNTER — Telehealth: Payer: Self-pay | Admitting: Family Medicine

## 2010-12-13 ENCOUNTER — Telehealth: Payer: Self-pay | Admitting: *Deleted

## 2010-12-13 ENCOUNTER — Ambulatory Visit (INDEPENDENT_AMBULATORY_CARE_PROVIDER_SITE_OTHER): Payer: Medicare Other | Admitting: Family Medicine

## 2010-12-13 VITALS — BP 150/90 | HR 120 | Temp 98.0°F | Ht 67.5 in | Wt 159.0 lb

## 2010-12-13 DIAGNOSIS — I498 Other specified cardiac arrhythmias: Secondary | ICD-10-CM

## 2010-12-13 DIAGNOSIS — R000 Tachycardia, unspecified: Secondary | ICD-10-CM

## 2010-12-13 DIAGNOSIS — J069 Acute upper respiratory infection, unspecified: Secondary | ICD-10-CM

## 2010-12-13 DIAGNOSIS — R001 Bradycardia, unspecified: Secondary | ICD-10-CM

## 2010-12-13 MED ORDER — HYDROCODONE-HOMATROPINE 5-1.5 MG/5ML PO SYRP: 5.0000 mL | ORAL_SOLUTION | Freq: Four times a day (QID) | ORAL | Status: AC | PRN

## 2010-12-13 MED ORDER — HYDROCODONE-HOMATROPINE 5-1.5 MG/5ML PO SYRP: 120.0000 mL | ORAL_SOLUTION | Freq: Four times a day (QID) | ORAL | Status: AC | PRN

## 2010-12-13 NOTE — Telephone Encounter (Signed)
Appt made for Monday with Dr. Cleopatra Cedar

## 2010-12-13 NOTE — Telephone Encounter (Signed)
Patient came in for visit today

## 2010-12-13 NOTE — Progress Notes (Signed)
  Subjective:    Patient ID: Sheila Armstrong, female    DOB: [birth year], 76 y.o.   MRN: [id number]  HPI  Sheila Armstrong is a 76 year old female, who comes in today for evaluation of a cough x 5 days, and rapid heart rate.  On Monday of this week.  She developed a viral type syndrome.  No fever, cough, and congestion.  Also noticed her heart rate was rather rapid.  No chest pain, shortness of breath, et Karie Soda.  She's not been taking any over-the-counter pseudoephedrine.  She was actually unaware of the rapid heart rate into she went to the drug store  Review of Systems    Negative Objective:   Physical Exam     She is a well-developed, well-nourished, female, in no acute distress.  Examination of the HEENT were negative.  Neck was supple.  No adenopathy.  Thyroid not enlarged.  Chest is clear to auscultation.  Cardiac exam negative   Assessment & Plan:  Viral syndrome  sinus tachycardia Drink lots of liquids, Tylenol for fever hydromet  one half to 1 teaspoon 3 times a day as needed for cough.  Return p.r.n.

## 2010-12-13 NOTE — Telephone Encounter (Signed)
Error in opening this phone note.

## 2010-12-13 NOTE — Telephone Encounter (Signed)
Called pt and offered appt for today at 3:30pm but pt adv that she had just sat down for another appt (hair appt) and she would not be finished till after 5pm (could not come in today due to same).... Pt did not express urgency about tachycardia and adv she has an appt on Monday with Dr Tawanna Cooler...?  Pt was adv about sat clinic and if anything out of ordinary occurs before Monday to go to ED for evaluation per nurse... Pt acknowledged same.

## 2010-12-13 NOTE — Patient Instructions (Signed)
Drink lots of fluids, Tylenol for general cough and cold symptoms, Hydromet one half to 1 teaspoon t.i.d., p.r.n. Cough.  Return p.r.n.

## 2010-12-16 ENCOUNTER — Ambulatory Visit: Payer: Self-pay | Admitting: Family Medicine

## 2010-12-26 ENCOUNTER — Ambulatory Visit
Admission: RE | Admit: 2010-12-26 | Discharge: 2010-12-26 | Disposition: A | Discharge status: Home / Self Care | Payer: BC Managed Care – PPO | Source: Ambulatory Visit | Attending: Family Medicine | Admitting: Family Medicine

## 2010-12-26 DIAGNOSIS — Z1239 Encounter for other screening for malignant neoplasm of breast: Secondary | ICD-10-CM

## 2011-01-27 ENCOUNTER — Ambulatory Visit (INDEPENDENT_AMBULATORY_CARE_PROVIDER_SITE_OTHER): Payer: Medicare Other | Admitting: Physician Assistant

## 2011-01-27 ENCOUNTER — Telehealth: Payer: Self-pay | Admitting: Gastroenterology

## 2011-01-27 ENCOUNTER — Encounter: Payer: Self-pay | Admitting: Physician Assistant

## 2011-01-27 DIAGNOSIS — K5732 Diverticulitis of large intestine without perforation or abscess without bleeding: Secondary | ICD-10-CM

## 2011-01-27 DIAGNOSIS — R1032 Left lower quadrant pain: Secondary | ICD-10-CM

## 2011-02-06 NOTE — Progress Notes (Signed)
Summary: triage  Phone Note Call from Patient Call back at Home Phone [phone]   Caller: Patient Call For: Dr Jarold Motto Reason for Call: Talk to Nurse Summary of Call: Patient wants to be seen today for lower abd pain. Initial call taken by: Tawni Levy,  January 27, 2011 8:41 AM  Follow-up for Phone Call        Patient c/o left sided lower abd pain since last week.She denies diarrhea otr a fever. She reported an episode of Diverticulitis last year. Patient given an appointment today at 3:30pm. Follow-up by: Graciella Freer RN,  January 27, 2011 11:12 AM  Additional Follow-up for Phone Call Additional follow up Details #1::        Chart ordered. Additional Follow-up by: Graciella Freer RN,  January 27, 2011 11:16 AM

## 2011-02-06 NOTE — Assessment & Plan Note (Signed)
Summary: L side abd pain since last week, no pain, no temp, [place] ...   History of Present Illness Visit Type: Follow-up Visit Primary GI MD: Sheryn Bison MD FACP FAGA Primary Provider:  Governor Specking, MD Requesting Provider: na Chief Complaint: LLQ abd pain, and constipation comes and goes  History of Present Illness:    Very nice 76 year old female known to Dr. Sheryn Bison with history of colon polyps diverticulosis and family history of colon cancer. Her last colonoscopy was in 2009. She had no recurrent polyps at that time.    she was last seen approximately one year ago with a mild episode of diverticulitis. She says she has done well in the interim until about 4 days ago when she began with lower abdominal aching and pressure in the left lower quadrant. Her symptoms have been persistent though not severe. She has not had any documented fever or chills. Her appetite has been fine and she has not noticed any increase in her discomfort with by mouth intake. No melena or hematochezia. She does have intermittent problems with constipation and had been constipated about a week and a half ago which has since resolved. She does stay on a high-fiber diet and uses Benefiber daily. She has no current dysuria ,urgency, or frequency.   GI Review of Systems    Reports abdominal pain.     Location of  Abdominal pain: LLQ.    Denies acid reflux, belching, bloating, chest pain, dysphagia with liquids, dysphagia with solids, heartburn, loss of appetite, nausea, vomiting, vomiting blood, weight loss, and  weight gain.      Reports constipation.     Denies anal fissure, black tarry stools, change in bowel habit, diarrhea, diverticulosis, fecal incontinence, heme positive stool, hemorrhoids, irritable bowel syndrome, jaundice, light color stool, liver problems, rectal bleeding, and  rectal pain.    Current Medications (verified): 1)  Multivitamins   Tabs (Multiple Vitamin) .... Take 1 Tablet By  Mouth Once A Day 2)  Calcium Citrate-Vitamin D 1500-200 Mg-Unit Tabs (Calcium Citrate-Vitamin D) .... Take 1 Tablet By Mouth Once A Day 3)  Nasonex 50 Mcg/act Susp (Mometasone Furoate) .... Insert 1 Puff Into Each Nostril Once Daily 4)  Astelin 137 Mcg/spray Soln (Azelastine Hcl) .... Insert 1 Spray in Each Nostril Once Daily 5)  Osteo Bi-Flex Regular Strength 250-200 Mg Tabs (Glucosamine-Chondroitin) .... Take 1 Tablet By Mouth Once A Day 6)  Premarin 0.625 Mg/gm Crea (Estrogens, Conjugated) .... Apply 0.5 G Twice Weekly 7)  Benefiber  Pack (Guar Gum) .... One Once Daily  Allergies (verified): 1)  ! Sulfa  Past History:  Past Medical History: Allergic rhinitis bilateral cataracts, and lens implants childbirth x 2 rhinoplasty D&C ABDOMINAL PAIN, RIGHT LOWER QUADRANT (ICD-789.03) NEOPLASM, COLON, FAMILY HX (ICD-V16.0) DIVERTICULOSIS, COLON (ICD-562.10) COLONIC POLYPS, ADENOMATOUS, HX OF (ICD-V12.72) SEBORRHEIC KERATOSIS, INFLAMED (ICD-702.11) SENILE VAGINITIS (ICD-627.3) FAMILY HISTORY OF CAD FEMALE 1ST DEGREE RELATIVE <50 (ICD-V17.3) ALLERGIC RHINITIS (ICD-477.9)  Past Surgical History: Childbirth x 2 Rhinoplasty D/C R + L implants-eyes Colonoscopy-11/19/2005,10/09-patterson  Family History: Reviewed history from 07/13/2008 and no changes required. Family History of CAD Female 1st degree relative <50 Family History Other cancer-Colon: Mother  Social History: Reviewed history from 12/31/2009 and no changes required. Former Smoker-stopped 45 years ago Alcohol use-no Widow and remarried Occupation: Retired Daily Caffeine Use-1/2 cup daily Illicit Drug Use - no Patient gets regular exercise.  Review of Systems  The patient denies allergy/sinus, anemia, anxiety-new, arthritis/joint pain, back pain, blood in urine, breast changes/lumps,  change in vision, confusion, cough, coughing up blood, depression-new, fainting, fatigue, fever, headaches-new, hearing problems, heart  murmur, heart rhythm changes, itching, menstrual pain, muscle pains/cramps, night sweats, nosebleeds, pregnancy symptoms, shortness of breath, skin rash, sleeping problems, sore throat, swelling of feet/legs, swollen lymph glands, thirst - excessive , urination - excessive , urination changes/pain, urine leakage, vision changes, and voice change.         see hpi  Vital Signs:  Patient profile:   76 year old female Height:      68 inches Weight:      157 pounds BMI:     23.96 BSA:     1.85 Pulse rate:   96 / minute Pulse rhythm:   regular BP sitting:   134 / 76  (left arm) Cuff size:   regular  Vitals Entered By: Ok Anis CMA (January 27, 2011 3:27 PM)  Physical Exam  General:  Well developed, well nourished, no acute distress. Head:  Normocephalic and atraumatic. Eyes:  PERRLA, no icterus. Lungs:  Clear throughout to auscultation. Heart:  Regular rate and rhythm; no murmurs, rubs,  or bruits. Abdomen:  soft, mildly tender LLq, no guarding, no rebound, no mass or hsm,bs+ Rectal:  not done Neurologic:  Alert and  oriented x4;  grossly normal neurologically. Psych:  Alert and cooperative. Normal mood and affect.   Impression & Recommendations:  Problem # 1:  DIVERTICULITIS-COLON (ZOX-096.04) Assessment New  76 year old white female with 4 day history of mild left lower quadrant.Sxs are consistent with mild diverticulitis.    Start Cipro 500 mg by mouth twice daily x10 days.  Start a line one by mouth daily x14 days , samples given   patient was advised to call in the interim if her symptoms worsen or have not resolved when she finishes her antibiotics.   followup with Dr. Jarold Motto on a when necessary basis  Problem # 2:  NEOPLASM, COLON, FAMILY HX (ICD-V16.0) Assessment: Comment Only  Followup colonoscopy October 2014  Problem # 3:  COLONIC POLYPS, ADENOMATOUS, HX OF (ICD-V12.72) Assessment: Comment Only  no polyps at last colonoscopy- plan followup 2014  Patient  Instructions: 1)  Take Align samples x 14 days. Take 1 daily. 2)  We sent prescription for Cipro 500 mg to [facility].  3)  Copy sent to : Kelle Darting, MD 4)  The medication list was reviewed and reconciled.  All changed / newly prescribed medications were explained.  A complete medication list was provided to the patient / caregiver. Prescriptions: CIPRO 500 MG TABS (CIPROFLOXACIN HCL) Take 1 tab twice daily x 10 days  #20 x 0   Entered by:   Lowry Ram NCMA   Authorized by:   Sammuel Cooper PA-c   Signed by:   Lowry Ram NCMA on 01/27/2011   Method used:   Electronically to        [facility] [phone]* (retail)       [address] [place], [place]  [address]       Ph: [phone] or [phone]       Fax: [phone]   RxID:   [id number]

## 2011-02-12 ENCOUNTER — Encounter: Payer: Self-pay | Admitting: Family Medicine

## 2011-02-13 ENCOUNTER — Other Ambulatory Visit (HOSPITAL_COMMUNITY)
Admission: RE | Admit: 2011-02-13 | Discharge: 2011-02-13 | Disposition: A | Discharge status: Home / Self Care | Payer: Medicare Other | Source: Ambulatory Visit | Attending: Family Medicine | Admitting: Family Medicine

## 2011-02-13 ENCOUNTER — Ambulatory Visit (INDEPENDENT_AMBULATORY_CARE_PROVIDER_SITE_OTHER): Payer: Medicare Other | Admitting: Family Medicine

## 2011-02-13 ENCOUNTER — Encounter: Payer: Self-pay | Admitting: Family Medicine

## 2011-02-13 VITALS — BP 120/76 | Temp 98.4°F | Ht 67.75 in | Wt 156.0 lb

## 2011-02-13 DIAGNOSIS — N952 Postmenopausal atrophic vaginitis: Secondary | ICD-10-CM

## 2011-02-13 DIAGNOSIS — Z136 Encounter for screening for cardiovascular disorders: Secondary | ICD-10-CM

## 2011-02-13 DIAGNOSIS — Z13220 Encounter for screening for lipoid disorders: Secondary | ICD-10-CM

## 2011-02-13 DIAGNOSIS — E049 Nontoxic goiter, unspecified: Secondary | ICD-10-CM

## 2011-02-13 DIAGNOSIS — Z124 Encounter for screening for malignant neoplasm of cervix: Secondary | ICD-10-CM | POA: Insufficient documentation

## 2011-02-13 DIAGNOSIS — E040 Nontoxic diffuse goiter: Secondary | ICD-10-CM

## 2011-02-13 DIAGNOSIS — J301 Allergic rhinitis due to pollen: Secondary | ICD-10-CM

## 2011-02-13 DIAGNOSIS — Z0000 Encounter for general adult medical examination without abnormal findings: Secondary | ICD-10-CM

## 2011-02-13 DIAGNOSIS — Z23 Encounter for immunization: Secondary | ICD-10-CM

## 2011-02-13 LAB — BASIC METABOLIC PANEL
BUN: 11 mg/dL (ref 6–23)
CO2: 30 mEq/L (ref 19–32)
Calcium: 9.8 mg/dL (ref 8.4–10.5)
Creatinine, Ser: 0.8 mg/dL (ref 0.4–1.2)
GFR: 74.64 mL/min (ref >60.00)
Glucose, Bld: 87 mg/dL (ref 70–99)
Sodium: 142 mEq/L (ref 135–145)

## 2011-02-13 LAB — POCT URINALYSIS DIPSTICK
Bilirubin, UA: NEGATIVE
Blood, UA: NEGATIVE
Glucose, UA: NEGATIVE
Nitrite, UA: NEGATIVE
Spec Grav, UA: 1.015
pH, UA: 6.5

## 2011-02-13 LAB — CBC WITH DIFFERENTIAL/PLATELET
Basophils Absolute: 0 10*3/uL (ref 0.0–0.1)
Basophils Relative: 0.5 % (ref 0.0–3.0)
Eosinophils Absolute: 0.1 10*3/uL (ref 0.0–0.7)
Eosinophils Relative: 2.3 % (ref 0.0–5.0)
HCT: 34.4 % — ABNORMAL LOW (ref 36.0–46.0)
Hemoglobin: 12 g/dL (ref 12.0–15.0)
Lymphocytes Relative: 25 % (ref 12.0–46.0)
Lymphs Abs: 1.2 10*3/uL (ref 0.7–4.0)
MCHC: 34.8 g/dL (ref 30.0–36.0)
MCV: 94.3 fl (ref 78.0–100.0)
Monocytes Absolute: 0.4 10*3/uL (ref 0.1–1.0)
Monocytes Relative: 7.4 % (ref 3.0–12.0)
Neutro Abs: 3.2 10*3/uL (ref 1.4–7.7)
Neutrophils Relative %: 64.8 % (ref 43.0–77.0)
Platelets: 296 10*3/uL (ref 150.0–400.0)
RBC: 3.65 Mil/uL — ABNORMAL LOW (ref 3.87–5.11)
RDW: 14.1 % (ref 11.5–14.6)
WBC: 5 10*3/uL (ref 4.5–10.5)

## 2011-02-13 LAB — HEPATIC FUNCTION PANEL
ALT: 20 U/L (ref 0–35)
AST: 24 U/L (ref 0–37)
Albumin: 3.9 g/dL (ref 3.5–5.2)
Alkaline Phosphatase: 54 U/L (ref 39–117)

## 2011-02-13 LAB — LIPID PANEL
Cholesterol: 198 mg/dL (ref 0–200)
HDL: 55.7 mg/dL (ref >39.00)
LDL Cholesterol: 130 mg/dL — ABNORMAL HIGH (ref 0–99)
Total CHOL/HDL Ratio: 4
Triglycerides: 61 mg/dL (ref 0.0–149.0)

## 2011-02-13 MED ORDER — ESTROGENS, CONJUGATED 0.625 MG/GM VA CREA: TOPICAL_CREAM | VAGINAL | Status: DC

## 2011-02-13 MED ORDER — AZELASTINE HCL 0.1 % NA SOLN: 1.0000 | Freq: Two times a day (BID) | NASAL | Status: DC

## 2011-02-13 MED ORDER — MOMETASONE FUROATE 50 MCG/ACT NA SUSP: 2.0000 | Freq: Every day | NASAL | Status: DC

## 2011-02-13 NOTE — Patient Instructions (Signed)
Continue current medications follow-up in one year, sooner if any problems

## 2011-02-13 NOTE — Progress Notes (Signed)
  Subjective:    Patient ID: Sheila Armstrong, female    DOB: 10/26/1935, 76 y.o.   MRN: [id number]  HPI Sheila Armstrong is a delightful, 76 year old, married female, nonsmoker, who comes in today for general physical examination because of a history of allergic rhinitis and senile vaginitis.  She takes a combination of Astelin nasal spray, steroid nasal spray, for her allergic rhinitis.  She also uses Premarin vaginal cream twice weekly because of vaginal dryness.  She gets routine eye care, dental care, BSE monthly, annual mammography, colonoscopy,,,,,,,,, except for some diverticuli,,,,,,,, tetanus 2002, Pneumovax, complete, shingles, complete.  Tetanus booster today   Review of Systems  Constitutional: Negative.   HENT: Negative.   Eyes: Negative.   Respiratory: Negative.   Cardiovascular: Negative.   Gastrointestinal: Negative.   Genitourinary: Negative.   Musculoskeletal: Negative.   Neurological: Negative.   Hematological: Negative.   Psychiatric/Behavioral: Negative.        Objective:   Physical Exam  Constitutional: She appears well-developed and well-nourished.  HENT:  Head: Normocephalic and atraumatic.  Right Ear: External ear normal.  Left Ear: External ear normal.  Nose: Nose normal.  Mouth/Throat: Oropharynx is clear and moist.  Eyes: EOM are normal. Pupils are equal, round, and reactive to light.  Neck: Normal range of motion. Neck supple. No thyromegaly present.  Cardiovascular: Normal rate, regular rhythm, normal heart sounds and intact distal pulses.  Exam reveals no gallop and no friction rub.   No murmur heard. Pulmonary/Chest: Effort normal and breath sounds normal.  Abdominal: Soft. Bowel sounds are normal. She exhibits no distension and no mass. There is no tenderness. There is no rebound.  Genitourinary: Vagina normal and uterus normal. Guaiac negative stool. No vaginal discharge found.       Bilateral breast exam normal  Musculoskeletal: Normal range of  motion.  Lymphadenopathy:    She has no cervical adenopathy.  Neurological: She is alert. She has normal reflexes. No cranial nerve deficit. She exhibits normal muscle tone. Coordination normal.  Skin: Skin is warm and dry.  Psychiatric: She has a normal mood and affect. Her behavior is normal. Judgment and thought content normal.          Assessment & Plan:  Allergic rhinitis,,,,,,,,, continue medication.  Senile vaginitis,,,,,,,,,, continue medication.  Follow-up one year or sooner if any problem

## 2011-06-11 ENCOUNTER — Telehealth: Payer: Self-pay | Admitting: Gastroenterology

## 2011-06-11 NOTE — Telephone Encounter (Signed)
Pt reports she's been taking Benefiber since March, and it's no longer available.she reports she can't get Metamucil to dissolve in water. Informed pt I had many discussions on Benefiber and I think a pt informed us [facility] has a similar product. Pt will check with [facility] and call back if she has problems.

## 2011-08-26 ENCOUNTER — Ambulatory Visit (INDEPENDENT_AMBULATORY_CARE_PROVIDER_SITE_OTHER): Payer: Medicare Other

## 2011-08-26 DIAGNOSIS — Z23 Encounter for immunization: Secondary | ICD-10-CM

## 2011-09-23 ENCOUNTER — Telehealth: Payer: Self-pay | Admitting: *Deleted

## 2011-09-23 NOTE — Telephone Encounter (Signed)
Appt scheduled for rash under eyes.

## 2011-09-24 ENCOUNTER — Ambulatory Visit (INDEPENDENT_AMBULATORY_CARE_PROVIDER_SITE_OTHER): Payer: Medicare Other | Admitting: Family Medicine

## 2011-09-24 ENCOUNTER — Encounter: Payer: Self-pay | Admitting: Family Medicine

## 2011-09-24 VITALS — BP 110/78 | Temp 98.3°F | Wt 158.0 lb

## 2011-09-24 DIAGNOSIS — L259 Unspecified contact dermatitis, unspecified cause: Secondary | ICD-10-CM | POA: Insufficient documentation

## 2011-09-24 MED ORDER — PREDNISONE 20 MG PO TABS: ORAL_TABLET | ORAL | Status: DC

## 2011-09-24 NOTE — Progress Notes (Signed)
  Subjective:    Patient ID: Sheila Armstrong, female    DOB: 28-Nov-1934, 76 y.o.   MRN: [id number]  HPIRebecca is a 76 year old female, who comes in today for evaluation of a rash on her face.  She noticed about 10 days ago.  Some itching and redness on the upper part of her left forehead.  She's been using a combination of antibiotic ointment and steroid cream.  The area is pruritic and not painful.  No vesicles.    Review of Systems    General and dermatologic review of systems otherwise negative Objective:   Physical Exam  Well-developed well-nourished, female, in no acute distress.  Examination today shows an erythematous rash consistent with a contact dermatitis      Assessment & Plan:  Contact dermatitis.  Plan prednisone burst and taper return p.r.n.

## 2011-09-24 NOTE — Patient Instructions (Signed)
Take the prednisone as directed.  Return p.r.n. Stop the ointments

## 2011-09-29 ENCOUNTER — Telehealth: Payer: Self-pay | Admitting: Family Medicine

## 2011-09-29 NOTE — Telephone Encounter (Signed)
Located call in another prescription for prednisone 20 mg, number 30 directions uses previously directed

## 2011-09-29 NOTE — Telephone Encounter (Signed)
Please advise 

## 2011-09-29 NOTE — Telephone Encounter (Signed)
Pt left prednisone pills in [place]. Pt has taken 8 pills already. [facility] [phone]

## 2011-09-30 MED ORDER — PREDNISONE 20 MG PO TABS: 20.0000 mg | ORAL_TABLET | Freq: Every day | ORAL | Status: DC

## 2012-01-09 ENCOUNTER — Other Ambulatory Visit: Payer: Self-pay | Admitting: Family Medicine

## 2012-01-09 DIAGNOSIS — Z1231 Encounter for screening mammogram for malignant neoplasm of breast: Secondary | ICD-10-CM

## 2012-01-30 ENCOUNTER — Ambulatory Visit
Admission: RE | Admit: 2012-01-30 | Discharge: 2012-01-30 | Disposition: A | Discharge status: Home / Self Care | Payer: Medicare Other | Source: Ambulatory Visit | Attending: Family Medicine | Admitting: Family Medicine

## 2012-01-30 DIAGNOSIS — Z1231 Encounter for screening mammogram for malignant neoplasm of breast: Secondary | ICD-10-CM

## 2012-03-09 ENCOUNTER — Encounter: Payer: Self-pay | Admitting: Family Medicine

## 2012-03-09 ENCOUNTER — Other Ambulatory Visit (HOSPITAL_COMMUNITY)
Admission: RE | Admit: 2012-03-09 | Discharge: 2012-03-09 | Disposition: A | Discharge status: Home / Self Care | Payer: Medicare Other | Source: Ambulatory Visit | Attending: Family Medicine | Admitting: Family Medicine

## 2012-03-09 ENCOUNTER — Ambulatory Visit (INDEPENDENT_AMBULATORY_CARE_PROVIDER_SITE_OTHER): Payer: Medicare Other | Admitting: Family Medicine

## 2012-03-09 VITALS — BP 116/80 | Temp 98.0°F | Ht 67.75 in | Wt 156.0 lb

## 2012-03-09 DIAGNOSIS — J309 Allergic rhinitis, unspecified: Secondary | ICD-10-CM

## 2012-03-09 DIAGNOSIS — Z01419 Encounter for gynecological examination (general) (routine) without abnormal findings: Secondary | ICD-10-CM

## 2012-03-09 DIAGNOSIS — Z124 Encounter for screening for malignant neoplasm of cervix: Secondary | ICD-10-CM | POA: Insufficient documentation

## 2012-03-09 DIAGNOSIS — J301 Allergic rhinitis due to pollen: Secondary | ICD-10-CM

## 2012-03-09 DIAGNOSIS — K5730 Diverticulosis of large intestine without perforation or abscess without bleeding: Secondary | ICD-10-CM

## 2012-03-09 DIAGNOSIS — E049 Nontoxic goiter, unspecified: Secondary | ICD-10-CM

## 2012-03-09 DIAGNOSIS — Z0000 Encounter for general adult medical examination without abnormal findings: Secondary | ICD-10-CM

## 2012-03-09 DIAGNOSIS — N952 Postmenopausal atrophic vaginitis: Secondary | ICD-10-CM

## 2012-03-09 DIAGNOSIS — Z8679 Personal history of other diseases of the circulatory system: Secondary | ICD-10-CM

## 2012-03-09 DIAGNOSIS — Z86010 Personal history of colonic polyps: Secondary | ICD-10-CM

## 2012-03-09 LAB — BASIC METABOLIC PANEL
BUN: 15 mg/dL (ref 6–23)
CO2: 28 mEq/L (ref 19–32)
Calcium: 9.6 mg/dL (ref 8.4–10.5)
Chloride: 106 mEq/L (ref 96–112)
Creatinine, Ser: 0.9 mg/dL (ref 0.4–1.2)
GFR: 64.97 mL/min (ref >60.00)
Glucose, Bld: 93 mg/dL (ref 70–99)
Potassium: 5.1 mEq/L (ref 3.5–5.1)
Sodium: 142 mEq/L (ref 135–145)

## 2012-03-09 LAB — CBC WITH DIFFERENTIAL/PLATELET
Basophils Absolute: 0 10*3/uL (ref 0.0–0.1)
Basophils Relative: 0.6 % (ref 0.0–3.0)
Eosinophils Absolute: 0.3 10*3/uL (ref 0.0–0.7)
Eosinophils Relative: 4.1 % (ref 0.0–5.0)
HCT: 36 % (ref 36.0–46.0)
Hemoglobin: 12.2 g/dL (ref 12.0–15.0)
Lymphocytes Relative: 28.3 % (ref 12.0–46.0)
Lymphs Abs: 1.7 10*3/uL (ref 0.7–4.0)
MCHC: 34 g/dL (ref 30.0–36.0)
MCV: 93.8 fl (ref 78.0–100.0)
Monocytes Absolute: 0.4 10*3/uL (ref 0.1–1.0)
Monocytes Relative: 7.2 % (ref 3.0–12.0)
Neutro Abs: 3.7 10*3/uL (ref 1.4–7.7)
Neutrophils Relative %: 59.8 % (ref 43.0–77.0)
Platelets: 224 10*3/uL (ref 150.0–400.0)
RBC: 3.84 Mil/uL — ABNORMAL LOW (ref 3.87–5.11)
RDW: 13.2 % (ref 11.5–14.6)
WBC: 6.2 10*3/uL (ref 4.5–10.5)

## 2012-03-09 LAB — TSH: TSH: 1.85 u[IU]/mL (ref 0.35–5.50)

## 2012-03-09 LAB — POCT URINALYSIS DIPSTICK
Glucose, UA: NEGATIVE
Nitrite, UA: NEGATIVE
Spec Grav, UA: 1.015
Urobilinogen, UA: 0.2

## 2012-03-09 MED ORDER — MOMETASONE FUROATE 50 MCG/ACT NA SUSP: 2.0000 | Freq: Every day | NASAL | Status: DC

## 2012-03-09 MED ORDER — ESTROGENS, CONJUGATED 0.625 MG/GM VA CREA
TOPICAL_CREAM | VAGINAL | Status: DC
Start: 2012-03-09 — End: 2013-03-09

## 2012-03-09 MED ORDER — AZELASTINE HCL 0.1 % NA SOLN: 1.0000 | Freq: Two times a day (BID) | NASAL | Status: DC

## 2012-03-09 NOTE — Progress Notes (Signed)
  Subjective:    Patient ID: Sheila Armstrong, female    DOB: 1935/10/20, 77 y.o.   MRN: [id number]  HPI Quanesha is a  59 old female who comes in today for a Medicare wellness examination  She has allergic rhinitis for which she uses Astelin nasal spray and a steroid nasal spray  She also uses Premarin vaginal cream twice weekly for vaginal dryness calcium vitamin D and walks on a regular basis  She gets routine eye care, bilateral hearing aids, regular dental care, BSE monthly, and you mammography, screening colonoscopy last year normal every 5 years because her mother had colon cancer, tetanus 2012, Pneumovax x2, seasonal flu shot October 2012, shingles 2011  Cognitive function normal she walks on a regular basis, home health safety reviewed no issues identified, no guns in the house, she does have a health care power of attorney and living will   Review of Systems  Constitutional: Negative.   HENT: Negative.   Eyes: Negative.   Respiratory: Negative.   Cardiovascular: Negative.   Gastrointestinal: Negative.   Genitourinary: Negative.   Musculoskeletal: Negative.   Neurological: Negative.   Hematological: Negative.   Psychiatric/Behavioral: Negative.        Objective:   Physical Exam  Constitutional: She appears well-developed and well-nourished.  HENT:  Head: Normocephalic and atraumatic.  Right Ear: External ear normal.  Left Ear: External ear normal.  Nose: Nose normal.  Mouth/Throat: Oropharynx is clear and moist.  Eyes: EOM are normal. Pupils are equal, round, and reactive to light.  Neck: Normal range of motion. Neck supple. No thyromegaly present.  Cardiovascular: Normal rate, regular rhythm, normal heart sounds and intact distal pulses.  Exam reveals no gallop and no friction rub.   No murmur heard. Pulmonary/Chest: Effort normal and breath sounds normal.  Abdominal: Soft. Bowel sounds are normal. She exhibits no distension and no mass. There is no tenderness.  There is no rebound.  Genitourinary: Vagina normal and uterus normal. Guaiac negative stool. No vaginal discharge found.  Musculoskeletal: Normal range of motion.  Lymphadenopathy:    She has no cervical adenopathy.  Neurological: She is alert. She has normal reflexes. No cranial nerve deficit. She exhibits normal muscle tone. Coordination normal.  Skin: Skin is warm and dry.  Psychiatric: She has a normal mood and affect. Her behavior is normal. Judgment and thought content normal.          Assessment & Plan:  Healthy female  Allergic rhinitis continue Astelin and steroid nasal spray  Postmenopausal vaginal dryness continue program vaginal cream twice a week twice weekly calcium vitamin D and walking  Return in one year sooner if any problems

## 2012-03-09 NOTE — Patient Instructions (Signed)
Continue your current medication in good health habits  Return in one year sooner if any problems 

## 2012-03-25 ENCOUNTER — Telehealth: Payer: Self-pay | Admitting: Family Medicine

## 2012-03-25 NOTE — Telephone Encounter (Signed)
Spoke with patient.

## 2012-03-25 NOTE — Telephone Encounter (Signed)
Fleet Contras please call,,,,,,,,,, the common about the circulatory disorder was an area this has been corrected,,,,,,,, okay to take a baby aspirin daily

## 2012-03-25 NOTE — Telephone Encounter (Signed)
Patient called stating that on her ov it states that hx of unspecified circulatory disease and she would like an explanation of this. Also patient would like to know if she can for a trial period, take baby aspirin everyday and then have her hemoglobin checked to see if it will come down. Please advise.

## 2012-05-03 ENCOUNTER — Telehealth: Payer: Self-pay | Admitting: Gastroenterology

## 2012-05-03 NOTE — Telephone Encounter (Signed)
Pt informed Dr Jarold Motto instructed [place] to get a CT scan asap. Pt wonders if she could be seen instead, so she will see Mike Gip, PA tomorrow at 11am. Pt reports she was ok until a week ago and she drank regular milk instead of coconut almond milk and she thinks she ate too many salads. Discussed the FODMAP diet with pt and I will leave her a copy; pt stated understanding.

## 2012-05-03 NOTE — Telephone Encounter (Signed)
Pt with hx of diverticulosis/diverticulitis, adenomatous colon polyps. Pt last seen by Mike Gip, PA 01/27/11 with dx of diverticulitis and was given Cipro and Align. Today, pt reports L side abdominal pain since last Thursday. She has been trying to solve the problem by increasing water and watching her diet. She denies a temp or diarrhea. Please advise. Thanks.

## 2012-05-03 NOTE — Telephone Encounter (Signed)
NEEDS CT SCAN ASAP

## 2012-05-04 ENCOUNTER — Ambulatory Visit (INDEPENDENT_AMBULATORY_CARE_PROVIDER_SITE_OTHER): Payer: Medicare Other | Admitting: Physician Assistant

## 2012-05-04 ENCOUNTER — Encounter: Payer: Self-pay | Admitting: Physician Assistant

## 2012-05-04 VITALS — BP 120/66 | HR 78 | Ht 68.0 in | Wt 157.0 lb

## 2012-05-04 DIAGNOSIS — K5732 Diverticulitis of large intestine without perforation or abscess without bleeding: Secondary | ICD-10-CM

## 2012-05-04 DIAGNOSIS — K5792 Diverticulitis of intestine, part unspecified, without perforation or abscess without bleeding: Secondary | ICD-10-CM

## 2012-05-04 MED ORDER — CIPROFLOXACIN HCL 500 MG PO TABS: ORAL_TABLET | ORAL | Status: DC

## 2012-05-04 MED ORDER — METRONIDAZOLE 500 MG PO TABS: ORAL_TABLET | ORAL | Status: DC

## 2012-05-04 MED ORDER — ALIGN 4 MG PO CAPS: 1.0000 | ORAL_CAPSULE | Freq: Every day | ORAL | Status: AC

## 2012-05-04 NOTE — Patient Instructions (Addendum)
Your prescription(s) has(have) been sent to your pharmacy for you to pick up. Flagyl, Cipro Take Align OTC (probiotic) while on the antibiotics and if that helps you may continue the medication. You have been given samples of Align today. Please give [place] a call back after you have finished the medication if you are not better. You are due for a follow-up Colonoscopy next year.

## 2012-05-04 NOTE — Progress Notes (Signed)
I agree with assessment and plan.

## 2012-05-04 NOTE — Progress Notes (Signed)
Subjective:    Patient ID: Sheila Armstrong, female    DOB: 1934-12-04, 77 y.o.   MRN: [id number]  HPI Sheila Armstrong is very nice 77 year old female known to Dr. Jarold Motto with history of diverticular disease, previous adenomatous colon polyps and family history of colon cancer in her mother. She also has IBS. Last colonoscopy was done in September of 2009, which showed diverticulosis descending  to sigmoid colon, no polyps were found.  At this time she presents with complaints of persistent left lower quadrant pain over the past 5 days. She says it is not severe but is similar to prior episodes of diverticulitis. She has not had any nausea or vomiting and no change in her pain with by mouth intake. No fever or chills. Her bowel movements have been normal for her without melena or hematochezia. She does feel some pressure on her bladder at times She has altered her diet over the past few days eating less fiber and softer foods and does not feel that the pain has  progressed.    Review of Systems  Constitutional: Negative.   HENT: Negative.   Eyes: Negative.   Respiratory: Negative.   Cardiovascular: Negative.   Gastrointestinal: Positive for abdominal pain and abdominal distention.  Genitourinary: Negative.   Musculoskeletal: Negative.   Neurological: Negative.   Hematological: Negative.   Psychiatric/Behavioral: Negative.    Outpatient Prescriptions Prior to Visit  Medication Sig Dispense Refill  . azelastine (ASTELIN) 137 MCG/SPRAY nasal spray Place 1 spray into the nose 2 (two) times daily. Use in each nostril as directed  30 mL  11  . Calcium Citrate-Vitamin D 1500-200 MG-UNIT TABS Take by mouth daily.        Marland Kitchen conjugated estrogens (PREMARIN) vaginal cream Small amounts twice weekly  45 g  11  . mometasone (NASONEX) 50 MCG/ACT nasal spray Place 2 sprays into the nose daily.  17 g  11  . Multiple Vitamin (MULTIVITAMIN) tablet Take 1 tablet by mouth daily.             Allergies    Allergen Reactions  . Sulfonamide Derivatives     REACTION: hives   Patient Active Problem List  Diagnosis  . ALLERGIC RHINITIS  . DIVERTICULOSIS, COLON  . CONSTIPATION  . SENILE VAGINITIS  . SEBORRHEIC KERATOSIS, INFLAMED  . ABDOMINAL AORTIC ANEURYSM, HX OF  . COLONIC POLYPS, ADENOMATOUS, HX OF  . Contact dermatitis    Objective:   Physical Exam well-developed older white female in no acute distress, pleasant blood pressure 120/66 pulse 78 height 5 foot 8 weight 157. HEENT; nontraumatic normocephalic EOMI PERRLA sclera anicteric, Neck; supple no JVD, Cardiovascular; regular rate and rhythm with S1-S2 no murmur or gallop, Pulmonary; clear bilaterally, Abdomen; soft she is mildly tender in the left lower quadrant left mid quadrant there is no guarding no rebound no palpable mass or hepatosplenomegaly bowel sounds are active, Rectal; exam not done, Extremities no clubbing cyanosis or edema skin warm and dry, Psych; mood and affect normal and appropriate.        Assessment & Plan:  #43 77 year old female with a five-day history of left lower quadrant pain consistent with sigmoid diverticulitis. #2 history of adenomatous colon polyps, negative colonoscopy September 2009 #3 positive family history of colon cancer #4 IBS  Plan; start Cipro 500 mg by mouth twice daily x10 days Start Flagyl 500 mg by mouth twice daily x10 days Start a line one by mouth daily over the next 2 weeks. Patient also asks  about probiotic use for her IBS among we discussed this. I suggested if she is to use a probiotic that she may want to alternate products, to get benefit of different microorganisms. I also gave her samples of Resyst Patient is aware that she should call if her pain worsens at any point and also to call if her pain has not completely resolved when she finishes the antibiotics. Gave patient a copy of a [place] diet Plan followup colonoscopy in one year with Dr. Jarold Motto.

## 2012-06-28 ENCOUNTER — Other Ambulatory Visit (INDEPENDENT_AMBULATORY_CARE_PROVIDER_SITE_OTHER): Payer: Medicare Other

## 2012-06-28 DIAGNOSIS — E119 Type 2 diabetes mellitus without complications: Secondary | ICD-10-CM

## 2012-07-07 ENCOUNTER — Telehealth: Payer: Self-pay | Admitting: *Deleted

## 2012-07-07 DIAGNOSIS — R142 Eructation: Secondary | ICD-10-CM

## 2012-07-07 DIAGNOSIS — R197 Diarrhea, unspecified: Secondary | ICD-10-CM

## 2012-07-07 DIAGNOSIS — R140 Abdominal distension (gaseous): Secondary | ICD-10-CM

## 2012-07-07 NOTE — Telephone Encounter (Signed)
Stool c.diff

## 2012-07-07 NOTE — Telephone Encounter (Signed)
Pt with hx of IBS, Diverticulitis, Adenomatous Polyps, FH of Colon Ca; last OV 05/04/12 with Amy Esterwood, PA for diverticulitis tx with Cipro and Flagyl. Pt reports for about a week she's had increased gas, bloating and burping; this am she has had diarrhea like loose stools x 3. She denies a temp and she has seen no blood in her stool. Please advise. Thanks.

## 2012-07-07 NOTE — Telephone Encounter (Signed)
Informed pt she needs to get the containers for stool tests; pt stated understanding.

## 2012-07-09 ENCOUNTER — Other Ambulatory Visit: Payer: Medicare Other

## 2012-07-09 DIAGNOSIS — R142 Eructation: Secondary | ICD-10-CM

## 2012-07-09 DIAGNOSIS — R197 Diarrhea, unspecified: Secondary | ICD-10-CM

## 2012-07-09 DIAGNOSIS — R140 Abdominal distension (gaseous): Secondary | ICD-10-CM

## 2012-07-13 LAB — CLOSTRIDIUM DIFFICILE BY PCR: Toxigenic C. Difficile by PCR: NOT DETECTED

## 2012-08-26 ENCOUNTER — Ambulatory Visit (INDEPENDENT_AMBULATORY_CARE_PROVIDER_SITE_OTHER): Payer: Medicare Other

## 2012-08-26 DIAGNOSIS — Z23 Encounter for immunization: Secondary | ICD-10-CM

## 2012-11-11 ENCOUNTER — Ambulatory Visit: Payer: Medicare Other | Admitting: Family Medicine

## 2013-01-17 ENCOUNTER — Other Ambulatory Visit: Payer: Self-pay

## 2013-01-17 DIAGNOSIS — Z1231 Encounter for screening mammogram for malignant neoplasm of breast: Secondary | ICD-10-CM

## 2013-02-15 ENCOUNTER — Ambulatory Visit
Admission: RE | Admit: 2013-02-15 | Discharge: 2013-02-15 | Disposition: A | Discharge status: Home / Self Care | Payer: Medicare Other | Source: Ambulatory Visit

## 2013-02-15 DIAGNOSIS — Z1231 Encounter for screening mammogram for malignant neoplasm of breast: Secondary | ICD-10-CM

## 2013-03-09 ENCOUNTER — Other Ambulatory Visit (HOSPITAL_COMMUNITY)
Admission: RE | Admit: 2013-03-09 | Discharge: 2013-03-09 | Disposition: A | Discharge status: Home / Self Care | Payer: Medicare Other | Source: Ambulatory Visit | Attending: Family Medicine | Admitting: Family Medicine

## 2013-03-09 ENCOUNTER — Encounter: Payer: Self-pay | Admitting: Family Medicine

## 2013-03-09 ENCOUNTER — Ambulatory Visit (INDEPENDENT_AMBULATORY_CARE_PROVIDER_SITE_OTHER): Payer: Medicare Other | Admitting: Family Medicine

## 2013-03-09 VITALS — BP 110/74 | Temp 98.3°F | Ht 69.0 in | Wt 160.0 lb

## 2013-03-09 DIAGNOSIS — H9190 Unspecified hearing loss, unspecified ear: Secondary | ICD-10-CM

## 2013-03-09 DIAGNOSIS — Z86010 Personal history of colonic polyps: Secondary | ICD-10-CM

## 2013-03-09 DIAGNOSIS — Z01419 Encounter for gynecological examination (general) (routine) without abnormal findings: Secondary | ICD-10-CM

## 2013-03-09 DIAGNOSIS — J309 Allergic rhinitis, unspecified: Secondary | ICD-10-CM

## 2013-03-09 DIAGNOSIS — H9193 Unspecified hearing loss, bilateral: Secondary | ICD-10-CM | POA: Insufficient documentation

## 2013-03-09 DIAGNOSIS — J301 Allergic rhinitis due to pollen: Secondary | ICD-10-CM

## 2013-03-09 DIAGNOSIS — Z124 Encounter for screening for malignant neoplasm of cervix: Secondary | ICD-10-CM | POA: Insufficient documentation

## 2013-03-09 DIAGNOSIS — N952 Postmenopausal atrophic vaginitis: Secondary | ICD-10-CM

## 2013-03-09 DIAGNOSIS — K5730 Diverticulosis of large intestine without perforation or abscess without bleeding: Secondary | ICD-10-CM

## 2013-03-09 DIAGNOSIS — Z0000 Encounter for general adult medical examination without abnormal findings: Secondary | ICD-10-CM

## 2013-03-09 DIAGNOSIS — Z8679 Personal history of other diseases of the circulatory system: Secondary | ICD-10-CM

## 2013-03-09 LAB — CBC WITH DIFFERENTIAL/PLATELET
Basophils Absolute: 0 10*3/uL (ref 0.0–0.1)
Eosinophils Relative: 3 % (ref 0.0–5.0)
HCT: 37.5 % (ref 36.0–46.0)
Lymphocytes Relative: 26.4 % (ref 12.0–46.0)
Lymphs Abs: 1.9 10*3/uL (ref 0.7–4.0)
Monocytes Relative: 7.3 % (ref 3.0–12.0)
Neutrophils Relative %: 63 % (ref 43.0–77.0)
Platelets: 244 10*3/uL (ref 150.0–400.0)
WBC: 7.2 10*3/uL (ref 4.5–10.5)

## 2013-03-09 LAB — BASIC METABOLIC PANEL
Chloride: 102 mEq/L (ref 96–112)
GFR: 66.49 mL/min (ref >60.00)
Potassium: 4.9 mEq/L (ref 3.5–5.1)

## 2013-03-09 LAB — POCT URINALYSIS DIPSTICK
Bilirubin, UA: NEGATIVE
Glucose, UA: NEGATIVE
Ketones, UA: NEGATIVE
Nitrite, UA: NEGATIVE

## 2013-03-09 LAB — TSH: TSH: 1.86 u[IU]/mL (ref 0.35–5.50)

## 2013-03-09 MED ORDER — PREDNISONE 20 MG PO TABS: ORAL_TABLET | ORAL | Status: DC

## 2013-03-09 MED ORDER — AZELASTINE HCL 0.1 % NA SOLN: 1.0000 | Freq: Two times a day (BID) | NASAL | Status: DC

## 2013-03-09 MED ORDER — ESTROGENS, CONJUGATED 0.625 MG/GM VA CREA: TOPICAL_CREAM | VAGINAL | Status: DC

## 2013-03-09 NOTE — Patient Instructions (Signed)
Continue the Astelin nasal spray twice daily  Plain Zyrtec at bedtime is a good antihistamine  One shot of the steroid nasal spray at bedtime,,,,,,,,,,,,, this is now over-the-counter you can get without a prescription  Prednisone 20 mg,,,,,,,,,, use as directed  Small amounts of vaginal cream twice weekly  Continue daily walking program  Followup in 1 year sooner if any problems

## 2013-03-09 NOTE — Progress Notes (Signed)
  Subjective:    Patient ID: Sheila Armstrong, female    DOB: 04/04/1935, 78 y.o.   MRN: [id number]  HPI Sheila Armstrong is a 78 year old married female nonsmoker who comes in today for a Medicare wellness examination because of a history of underlying allergic rhinitis, bilateral hearing loss, post menopausal vaginal dryness  She takes a steroid nasal spray and Astelin nasal spray for allergic rhinitis. She recently went to her allergist because of head congestion. She had no fever etc. However was given an antibiotic. Advised her to stop the antibiotic.  She uses the Premarin vaginal cream twice weekly for postmenopausal vaginal dryness  She has bilateral hearing loss and wears bilateral hearing aids.  She gets routine eye care, dental care, BSE monthly, and you mammography, colonoscopy over 5 years because of a positive family history of colon cancer.  Cognitive function normal she walks on a regular basis home health safety reviewed no issues identified, no guns in the house, she does have a health care power of attorney and living well   Review of Systems  Constitutional: Negative.   HENT: Negative.   Eyes: Negative.   Respiratory: Negative.   Cardiovascular: Negative.   Gastrointestinal: Negative.   Genitourinary: Negative.   Musculoskeletal: Negative.   Neurological: Negative.   Psychiatric/Behavioral: Negative.        Objective:   Physical Exam  Constitutional: She appears well-developed and well-nourished. No distress.  HENT:  Head: Normocephalic and atraumatic.  Right Ear: External ear normal.  Left Ear: External ear normal.  Nose: Nose normal.  Mouth/Throat: Oropharynx is clear and moist.  Bilateral hearing aids  Eyes: EOM are normal. Pupils are equal, round, and reactive to light.  Neck: Normal range of motion. Neck supple. No thyromegaly present.  Cardiovascular: Normal rate, regular rhythm, normal heart sounds and intact distal pulses.  Exam reveals no gallop and no  friction rub.   No murmur heard. Pulmonary/Chest: Effort normal and breath sounds normal.  Abdominal: Soft. Bowel sounds are normal. She exhibits no distension and no mass. There is no tenderness. There is no rebound.  Genitourinary: Vagina normal and uterus normal. Guaiac negative stool. No vaginal discharge found.  Bilateral breast exam normal  Musculoskeletal: Normal range of motion. She exhibits no edema and no tenderness.  Lymphadenopathy:    She has no cervical adenopathy.  Neurological: She is alert. She has normal reflexes. No cranial nerve deficit. She exhibits normal muscle tone. Coordination normal.  Skin: Skin is warm. She is not diaphoretic.  Psychiatric: She has a normal mood and affect. Her behavior is normal. Judgment and thought content normal.          Assessment & Plan:  Healthy female  Allergic rhinitis continue the Astelin nasal spray along with a steroid nasal spray oral prednisone when necessary once or twice yearly  Postmenopausal vaginal dryness continue Premarin vaginal cream twice weekly  Bilateral hearing loss continue to wear her hearing aids

## 2013-07-05 ENCOUNTER — Telehealth: Payer: Self-pay | Admitting: Family Medicine

## 2013-07-05 ENCOUNTER — Telehealth: Payer: Self-pay | Admitting: Gastroenterology

## 2013-07-05 NOTE — Telephone Encounter (Signed)
Pt would like to have a colonoscopy. Dr Jarold Motto is refusing to give pt an appt for one, stating she is too old.  Pt's mother has colon cancer ageat 40. Pt has had one every 5 years. Pls advise.

## 2013-07-05 NOTE — Telephone Encounter (Signed)
Pt's last recall review stated no repeat COLON is needed, but pt states her mom died of Colon Cancer at age 76. Advised her to come in and discuss the situation with Dr Jarold Motto; pt will come on 07/19/13.

## 2013-07-06 NOTE — Telephone Encounter (Signed)
Spoke with patient and she will keep her appointment 07/19/13 with Dr Jarold Motto to discuss having a colonoscopy

## 2013-07-08 ENCOUNTER — Encounter: Payer: Self-pay | Admitting: *Deleted

## 2013-07-19 ENCOUNTER — Encounter: Payer: Self-pay | Admitting: Gastroenterology

## 2013-07-19 ENCOUNTER — Ambulatory Visit (INDEPENDENT_AMBULATORY_CARE_PROVIDER_SITE_OTHER): Payer: Medicare Other | Admitting: Gastroenterology

## 2013-07-19 VITALS — BP 120/80 | HR 96 | Ht 68.0 in | Wt 162.2 lb

## 2013-07-19 DIAGNOSIS — Z86010 Personal history of colonic polyps: Secondary | ICD-10-CM

## 2013-07-19 DIAGNOSIS — R140 Abdominal distension (gaseous): Secondary | ICD-10-CM

## 2013-07-19 DIAGNOSIS — R141 Gas pain: Secondary | ICD-10-CM

## 2013-07-19 DIAGNOSIS — Z800 Family history of malignant neoplasm of digestive organs: Secondary | ICD-10-CM

## 2013-07-19 MED ORDER — NA SULFATE-K SULFATE-MG SULF 17.5-3.13-1.6 GM/177ML PO SOLN: ORAL | Status: DC

## 2013-07-19 NOTE — Patient Instructions (Addendum)
You have been scheduled for a colonoscopy with propofol. Please follow written instructions given to you at your visit today.  Please pick up your prep kit at the pharmacy within the next 1-3 days. If you use inhalers (even only as needed), please bring them with you on the day of your procedure. Your physician has requested that you go to www.startemmi.com and enter the access code given to you at your visit today. This web site gives a general overview about your procedure. However, you should still follow specific instructions given to you by our office regarding your preparation for the procedure.  Please purchase Benefiber over the counter and take one tablespoon twice daily.

## 2013-07-19 NOTE — Progress Notes (Signed)
History of Present Illness:  This is a 78 year old Caucasian female with known diverticulosis coli.  Her mother died at age 7 from colon cancer.  Patient has abdominal gas, bloating, but denies bowel or regularity, melena hematochezia, upper GI or hepatobiliary complaints.  She is in excellent medical shape without systemic complaints.  She denies a specific food intolerances.  Last colonoscopy 5 years ago was unremarkable.  The patient has had chronic intermittent right lower quadrant pain of unexplained etiology for many years.  I have reviewed this patient's present history, medical and surgical past history, allergies and medications.     ROS:   All systems were reviewed and are negative unless otherwise stated in the HPI.    Physical Exam: Blood pressure 120/80, pulse 96 and regular weight 162. General well developed well nourished patient in no acute distress, appearing their stated age Eyes PERRLA, no icterus, fundoscopic exam per opthamologist Skin no lesions noted Neck supple, no adenopathy, no thyroid enlargement, no tenderness Chest clear to percussion and auscultation Heart no significant murmurs, gallops or rubs noted Abdomen no hepatosplenomegaly masses or tenderness, BS normal.  Extremities no acute joint lesions, edema, phlebitis or evidence of cellulitis. Neurologic patient oriented x 3, cranial nerves intact, no focal neurologic deficits noted. Psychological mental status normal and normal affect.  Assessment and plan: Diverticulosis coli in a patient with a family history of colon cancer.  I've scheduled her colonoscopy at her convenience.  She is to follow a high-fiber diet with Benefiber 1 tablespoon twice a day in her food.  Review of her chart does show a prominent polyp removed in 2004 and 2007.  Stool exam 2013 was guaiac-negative.  Review of labs shows normal CBC a metabolic profile.

## 2013-07-21 ENCOUNTER — Encounter: Payer: Self-pay | Admitting: Gastroenterology

## 2013-07-22 ENCOUNTER — Ambulatory Visit (AMBULATORY_SURGERY_CENTER): Payer: Medicare Other | Admitting: Gastroenterology

## 2013-07-22 ENCOUNTER — Encounter: Payer: Self-pay | Admitting: Gastroenterology

## 2013-07-22 VITALS — BP 132/88 | HR 78 | Temp 98.1°F | Resp 15 | Ht 68.0 in | Wt 162.0 lb

## 2013-07-22 DIAGNOSIS — K5730 Diverticulosis of large intestine without perforation or abscess without bleeding: Secondary | ICD-10-CM

## 2013-07-22 DIAGNOSIS — Z86010 Personal history of colonic polyps: Secondary | ICD-10-CM

## 2013-07-22 DIAGNOSIS — Z800 Family history of malignant neoplasm of digestive organs: Secondary | ICD-10-CM

## 2013-07-22 MED ORDER — SODIUM CHLORIDE 0.9 % IV SOLN: 500.0000 mL | INTRAVENOUS | Status: DC

## 2013-07-22 NOTE — Progress Notes (Addendum)
Patient did not have preoperative order for IV antibiotic SSI prophylaxis. (G8918)  Patient did not experience any of the following events: a burn prior to discharge; a fall within the facility; wrong site/side/patient/procedure/implant event; or a hospital transfer or hospital admission upon discharge from the facility. (G8907)  

## 2013-07-22 NOTE — Progress Notes (Signed)
A/ox3 pleased with MAC, report to Suzanne RN 

## 2013-07-22 NOTE — Patient Instructions (Addendum)
YOU HAD AN ENDOSCOPIC PROCEDURE TODAY AT THE [facility]: Refer to the procedure report that was given to you for any specific questions about what was found during the examination.  If the procedure report does not answer your questions, please call your gastroenterologist to clarify.  If you requested that your care partner not be given the details of your procedure findings, then the procedure report has been included in a sealed envelope for you to review at your convenience later.  YOU SHOULD EXPECT: Some feelings of bloating in the abdomen. Passage of more gas than usual.  Walking can help get rid of the air that was put into your GI tract during the procedure and reduce the bloating. If you had a lower endoscopy (such as a colonoscopy or flexible sigmoidoscopy) you may notice spotting of blood in your stool or on the toilet paper. If you underwent a bowel prep for your procedure, then you may not have a normal bowel movement for a few days.  DIET: Your first meal following the procedure should be a light meal and then it is ok to progress to your normal diet.  A half-sandwich or bowl of soup is an example of a good first meal.  Heavy or fried foods are harder to digest and may make you feel nauseous or bloated.  Likewise meals heavy in dairy and vegetables can cause extra gas to form and this can also increase the bloating.  Drink plenty of fluids but you should avoid alcoholic beverages for 24 hours.  Try to increase the amount of fiber in your diet due to your Diverticulosis  ACTIVITY: Your care partner should take you home directly after the procedure.  You should plan to take it easy, moving slowly for the rest of the day.  You can resume normal activity the day after the procedure however you should NOT DRIVE or use heavy machinery for 24 hours (because of the sedation medicines used during the test).    SYMPTOMS TO REPORT IMMEDIATELY: A gastroenterologist can be reached at any  hour.  During normal business hours, 8:30 AM to 5:00 PM Monday through Friday, call [phone].  After hours and on weekends, please call the GI answering service at [phone] who will take a message and have the physician on call contact you.   Following lower endoscopy (colonoscopy or flexible sigmoidoscopy):  Excessive amounts of blood in the stool  Significant tenderness or worsening of abdominal pains  Swelling of the abdomen that is new, acute  Fever of 100F or higher  FOLLOW UP: If any biopsies were taken you will be contacted by phone or by letter within the next 1-3 weeks.  Call your gastroenterologist if you have not heard about the biopsies in 3 weeks.  Our staff will call the home number listed on your records the next business day following your procedure to check on you and address any questions or concerns that you may have at that time regarding the information given to you following your procedure. This is a courtesy call and so if there is no answer at the home number and we have not heard from you through the emergency physician on call, we will assume that you have returned to your regular daily activities without incident.  SIGNATURES/CONFIDENTIALITY: You and/or your care partner have signed paperwork which will be entered into your electronic medical record.  These signatures attest to the fact that that the information above on your After Visit  Summary has been reviewed and is understood.  Full responsibility of the confidentiality of this discharge information lies with you and/or your care-partner. 

## 2013-07-22 NOTE — Op Note (Signed)
[facility] [address] [place], [address]   COLONOSCOPY PROCEDURE REPORT  PATIENT: Sheila Armstrong, Sheila Armstrong  MR#: [id number] BIRTHDATE: 1935/03/03 , 77  yrs. old GENDER: Female ENDOSCOPIST: Mardella Layman, MD, [facility] REFERRED BY: PROCEDURE DATE:  07/22/2013 PROCEDURE:   Colonoscopy, surveillance First Screening Colonoscopy - Avg.  risk and is 50 yrs.  old or older - No.      History of Adenoma - Now for follow-up colonoscopy & has been > or = to 3 yrs.  Yes hx of adenoma.  Has been 3 or more years since last colonoscopy. ASA CLASS:   Class II INDICATIONS:Patient's personal history of adenomatous colon polyps.  MEDICATIONS: propofol (Diprivan) 200mg  IV  DESCRIPTION OF PROCEDURE:   After the risks benefits and alternatives of the procedure were thoroughly explained, informed consent was obtained.  A digital rectal exam revealed no abnormalities of the rectum.   The LB [id number]  endoscope was introduced through the anus and advanced to the cecum, which was identified by both the appendix and ileocecal valve. No adverse events experienced.   The quality of the prep was excellent, using MoviPrep  The instrument was then slowly withdrawn as the colon was fully examined.      COLON FINDINGS: There was moderate diverticulosis noted in the descending colon and sigmoid colon with associated muscular hypertrophy and colonic spasm.   The colon was otherwise normal. There was no diverticulosis, inflammation, polyps or cancers unless previously stated.  Retroflexed views revealed no abnormalities. The time to cecum=3 minutes 54 seconds.  Withdrawal time=6 minutes 27 seconds.  The scope was withdrawn and the procedure completed. COMPLICATIONS: There were no complications.  ENDOSCOPIC IMPRESSION: 1.   There was moderate diverticulosis noted in the descending colon and sigmoid colon 2.   The colon was otherwise normal ...no polyps  noted...  RECOMMENDATIONS: 1.  Continue current medications 2.  Given your significant family history of colon cancer, you should have a repeat colonoscopy in 5 years   eSigned:  Mardella Layman, MD, [facility] 07/22/2013 3:11 PM   cc:   PATIENT NAME:  Sheila Armstrong, Sheila Armstrong MR#: [id number]

## 2013-07-25 ENCOUNTER — Telehealth: Payer: Self-pay

## 2013-07-25 NOTE — Telephone Encounter (Signed)
  Follow up Call-  Call back number 07/22/2013  Post procedure Call Back phone  # [phone]  Permission to leave phone message Yes     Patient questions:  Do you have a fever, pain , or abdominal swelling? no Pain Score  0 *  Have you tolerated food without any problems? yes  Have you been able to return to your normal activities? yes  Do you have any questions about your discharge instructions: Diet   no Medications  no Follow up visit  no  Do you have questions or concerns about your Care? no  Actions: * If pain score is 4 or above: No action needed, pain <4.   Per the pt, "everything was beatiful and I feel great". Maw

## 2014-01-09 ENCOUNTER — Other Ambulatory Visit: Payer: Self-pay

## 2014-01-09 DIAGNOSIS — Z1231 Encounter for screening mammogram for malignant neoplasm of breast: Secondary | ICD-10-CM

## 2014-02-16 ENCOUNTER — Ambulatory Visit
Admission: RE | Admit: 2014-02-16 | Discharge: 2014-02-16 | Disposition: A | Discharge status: Home / Self Care | Payer: Medicare Other | Source: Ambulatory Visit

## 2014-02-16 DIAGNOSIS — Z1231 Encounter for screening mammogram for malignant neoplasm of breast: Secondary | ICD-10-CM

## 2014-03-07 ENCOUNTER — Other Ambulatory Visit (INDEPENDENT_AMBULATORY_CARE_PROVIDER_SITE_OTHER): Payer: Medicare Other

## 2014-03-07 DIAGNOSIS — K5730 Diverticulosis of large intestine without perforation or abscess without bleeding: Secondary | ICD-10-CM

## 2014-03-07 DIAGNOSIS — Z0000 Encounter for general adult medical examination without abnormal findings: Secondary | ICD-10-CM

## 2014-03-07 DIAGNOSIS — Z136 Encounter for screening for cardiovascular disorders: Secondary | ICD-10-CM

## 2014-03-07 LAB — POCT URINALYSIS DIPSTICK
BILIRUBIN UA: NEGATIVE
Blood, UA: NEGATIVE
Glucose, UA: NEGATIVE
KETONES UA: NEGATIVE
NITRITE UA: NEGATIVE
PH UA: 5.5
Protein, UA: NEGATIVE
Spec Grav, UA: 1.015
Urobilinogen, UA: 0.2

## 2014-03-07 LAB — BASIC METABOLIC PANEL WITH GFR
BUN: 15 mg/dL (ref 6–23)
CO2: 27 meq/L (ref 19–32)
Calcium: 9.6 mg/dL (ref 8.4–10.5)
Chloride: 106 meq/L (ref 96–112)
Creatinine, Ser: 0.8 mg/dL (ref 0.4–1.2)
GFR: 70.95 mL/min
Glucose, Bld: 86 mg/dL (ref 70–99)
Potassium: 4.7 meq/L (ref 3.5–5.1)
Sodium: 140 meq/L (ref 135–145)

## 2014-03-07 LAB — HEPATIC FUNCTION PANEL
ALT: 18 U/L (ref 0–35)
AST: 23 U/L (ref 0–37)
Albumin: 3.9 g/dL (ref 3.5–5.2)
Alkaline Phosphatase: 48 U/L (ref 39–117)
Bilirubin, Direct: 0 mg/dL (ref 0.0–0.3)
Total Bilirubin: 0.4 mg/dL (ref 0.3–1.2)
Total Protein: 7.3 g/dL (ref 6.0–8.3)

## 2014-03-07 LAB — CBC WITH DIFFERENTIAL/PLATELET
BASOS ABS: 0 10*3/uL (ref 0.0–0.1)
Basophils Relative: 0.7 % (ref 0.0–3.0)
EOS ABS: 0.2 10*3/uL (ref 0.0–0.7)
Eosinophils Relative: 4.5 % (ref 0.0–5.0)
HCT: 35.4 % — ABNORMAL LOW (ref 36.0–46.0)
Hemoglobin: 12 g/dL (ref 12.0–15.0)
LYMPHS PCT: 28.2 % (ref 12.0–46.0)
Lymphs Abs: 1.5 10*3/uL (ref 0.7–4.0)
MCHC: 33.8 g/dL (ref 30.0–36.0)
MCV: 93.5 fl (ref 78.0–100.0)
MONO ABS: 0.5 10*3/uL (ref 0.1–1.0)
Monocytes Relative: 10 % (ref 3.0–12.0)
NEUTROS PCT: 56.6 % (ref 43.0–77.0)
Neutro Abs: 3.1 10*3/uL (ref 1.4–7.7)
PLATELETS: 235 10*3/uL (ref 150.0–400.0)
RBC: 3.79 Mil/uL — ABNORMAL LOW (ref 3.87–5.11)
RDW: 13.5 % (ref 11.5–14.6)
WBC: 5.4 10*3/uL (ref 4.5–10.5)

## 2014-03-07 LAB — LIPID PANEL
Cholesterol: 173 mg/dL (ref 0–200)
HDL: 44.6 mg/dL (ref >39.00)
LDL Cholesterol: 111 mg/dL — ABNORMAL HIGH (ref 0–99)
TRIGLYCERIDES: 87 mg/dL (ref 0.0–149.0)
Total CHOL/HDL Ratio: 4
VLDL: 17.4 mg/dL (ref 0.0–40.0)

## 2014-03-07 LAB — TSH: TSH: 1.99 u[IU]/mL (ref 0.35–5.50)

## 2014-03-14 ENCOUNTER — Ambulatory Visit (INDEPENDENT_AMBULATORY_CARE_PROVIDER_SITE_OTHER): Payer: Medicare Other | Admitting: Family Medicine

## 2014-03-14 ENCOUNTER — Encounter: Payer: Self-pay | Admitting: Family Medicine

## 2014-03-14 VITALS — BP 120/84 | Temp 98.1°F | Ht 67.5 in | Wt 160.0 lb

## 2014-03-14 DIAGNOSIS — Z860100 Personal history of colon polyps, unspecified: Secondary | ICD-10-CM

## 2014-03-14 DIAGNOSIS — Z8679 Personal history of other diseases of the circulatory system: Secondary | ICD-10-CM

## 2014-03-14 DIAGNOSIS — H9193 Unspecified hearing loss, bilateral: Secondary | ICD-10-CM

## 2014-03-14 DIAGNOSIS — J301 Allergic rhinitis due to pollen: Secondary | ICD-10-CM

## 2014-03-14 DIAGNOSIS — Z23 Encounter for immunization: Secondary | ICD-10-CM

## 2014-03-14 DIAGNOSIS — N952 Postmenopausal atrophic vaginitis: Secondary | ICD-10-CM

## 2014-03-14 DIAGNOSIS — K5730 Diverticulosis of large intestine without perforation or abscess without bleeding: Secondary | ICD-10-CM

## 2014-03-14 DIAGNOSIS — H9190 Unspecified hearing loss, unspecified ear: Secondary | ICD-10-CM

## 2014-03-14 DIAGNOSIS — J309 Allergic rhinitis, unspecified: Secondary | ICD-10-CM

## 2014-03-14 DIAGNOSIS — Z01419 Encounter for gynecological examination (general) (routine) without abnormal findings: Secondary | ICD-10-CM

## 2014-03-14 MED ORDER — ESTROGENS, CONJUGATED 0.625 MG/GM VA CREA: TOPICAL_CREAM | VAGINAL | Status: DC

## 2014-03-14 MED ORDER — AZELASTINE HCL 0.1 % NA SOLN: 1.0000 | Freq: Two times a day (BID) | NASAL | Status: DC

## 2014-03-14 NOTE — Addendum Note (Signed)
Addended by: Westley Hummer B on: 03/14/2014 04:27 PM   Modules accepted: Orders

## 2014-03-14 NOTE — Progress Notes (Signed)
Pre visit review using our clinic review tool, if applicable. No additional management support is needed unless otherwise documented below in the visit note. 

## 2014-03-14 NOTE — Patient Instructions (Signed)
Continued use small amounts of the hormonal cream twice weekly  Astelin when necessary for your allergy symptoms  Followup colonoscopies as outlined by GI

## 2014-03-14 NOTE — Progress Notes (Signed)
   Subjective:    Patient ID: Sheila Armstrong, female    DOB: 08-Mar-1935, 79 y.o.   MRN: [id number]  HPI Sheila Armstrong is a 79 year old female who comes in today for a Medicare wellness examination  She's always been in excellent health he said no chronic health problems except for some allergic rhinitis and post menopausal vaginal dryness. She is hormonal cream once or twice weekly. Never had trouble with her Paps or pelvics last was 2014 normal therefore recommended be 3 years  She gets routine eye care, dental care, BSE monthly, and you mammography, recent colonoscopy 2014 because of a history of diverticulosis and colon polyps. Study was normal.  Cognitive function normal she walks on a regular basis home health safety reviewed no issues identified, no guns in the house, she does have a health care power of attorney and living well  Vaccinations updated by Apolonio Schneiders   Review of Systems  Constitutional: Negative.   HENT: Negative.   Eyes: Negative.   Respiratory: Negative.   Cardiovascular: Negative.   Gastrointestinal: Negative.   Genitourinary: Negative.   Musculoskeletal: Negative.   Neurological: Negative.   Psychiatric/Behavioral: Negative.        Objective:   Physical Exam  Constitutional: She appears well-developed and well-nourished.  HENT:  Head: Normocephalic and atraumatic.  Right Ear: External ear normal.  Left Ear: External ear normal.  Nose: Nose normal.  Mouth/Throat: Oropharynx is clear and moist.  Eyes: EOM are normal. Pupils are equal, round, and reactive to light.  Neck: Normal range of motion. Neck supple. No thyromegaly present.  Cardiovascular: Normal rate, regular rhythm, normal heart sounds and intact distal pulses.  Exam reveals no gallop and no friction rub.   No murmur heard. Pulmonary/Chest: Effort normal and breath sounds normal.  Abdominal: Soft. Bowel sounds are normal. She exhibits no distension and no mass. There is no tenderness. There is no  rebound.  Genitourinary:  Bilateral breast exam normal  Musculoskeletal: Normal range of motion.  Lymphadenopathy:    She has no cervical adenopathy.  Neurological: She is alert. She has normal reflexes. No cranial nerve deficit. She exhibits normal muscle tone. Coordination normal.  Skin: Skin is warm and dry.  Total body skin exam normal  Psychiatric: She has a normal mood and affect. Her behavior is normal. Judgment and thought content normal.          Assessment & Plan:  Healthy female  Postmenopausal vaginal dryness continue small amounts of hormonal cream once or twice weekly allergic rhinitis continue nasal spray  History of diverticulitis diverticulosis and colon polyps followup in GI as outlined

## 2014-07-05 ENCOUNTER — Encounter: Payer: Self-pay | Admitting: Gastroenterology

## 2014-11-14 ENCOUNTER — Ambulatory Visit (INDEPENDENT_AMBULATORY_CARE_PROVIDER_SITE_OTHER): Payer: Medicare Other | Admitting: Family Medicine

## 2014-11-14 ENCOUNTER — Encounter: Payer: Self-pay | Admitting: Family Medicine

## 2014-11-14 VITALS — BP 124/80 | HR 86 | Temp 97.6°F | Wt 159.0 lb

## 2014-11-14 DIAGNOSIS — J069 Acute upper respiratory infection, unspecified: Secondary | ICD-10-CM

## 2014-11-14 DIAGNOSIS — B9789 Other viral agents as the cause of diseases classified elsewhere: Principal | ICD-10-CM

## 2014-11-14 MED ORDER — HYDROCODONE-HOMATROPINE 5-1.5 MG/5ML PO SYRP: ORAL_SOLUTION | ORAL | Status: DC

## 2014-11-14 NOTE — Patient Instructions (Signed)
Drink lots of liquids  Tussionex........... one 12:45 half teaspoon at bedtime when necessary for nighttime cough or you may use the Hydromet

## 2014-11-14 NOTE — Progress Notes (Signed)
Pre visit review using our clinic review tool, if applicable. No additional management support is needed unless otherwise documented below in the visit note. 

## 2014-11-14 NOTE — Progress Notes (Signed)
   Subjective:    Patient ID: SAMARIA ANES, female    DOB: Sep 06, 1935, 80 y.o.   MRN: [id number]  HPI  Crysta is a 80 year old married female nonsmoker who comes in today following a visit to a clinic in [facility]  She went there on December 27 with a congestion runny nose and cough. Chest x-ray was normal for reasons I don't understand she was given Augmentin. She'll he took a couple doses and stopped it because she was afraid of the potential C. difficile colitis. She's been taken the cough syrup and is improving but she still has residual cough  Review of Systems    review of systems otherwise negative Objective:   Physical Exam  Well-developed well-nourished female no acute distress vital signs stable she's afebrile HEENT were negative neck was supple no adenopathy lungs are clear      Assessment & Plan:  Viral syndrome plan treat symptomatically............. antibiotics not indicated

## 2015-01-30 ENCOUNTER — Other Ambulatory Visit: Payer: Self-pay

## 2015-01-30 DIAGNOSIS — Z1239 Encounter for other screening for malignant neoplasm of breast: Secondary | ICD-10-CM

## 2015-02-20 ENCOUNTER — Ambulatory Visit
Admission: RE | Admit: 2015-02-20 | Discharge: 2015-02-20 | Disposition: A | Discharge status: Home / Self Care | Payer: Medicare Other | Source: Ambulatory Visit

## 2015-02-20 DIAGNOSIS — Z1239 Encounter for other screening for malignant neoplasm of breast: Secondary | ICD-10-CM

## 2015-03-27 ENCOUNTER — Encounter: Payer: Self-pay | Admitting: Family Medicine

## 2015-03-27 ENCOUNTER — Ambulatory Visit (INDEPENDENT_AMBULATORY_CARE_PROVIDER_SITE_OTHER): Payer: Medicare Other | Admitting: Family Medicine

## 2015-03-27 VITALS — BP 110/80 | Temp 98.3°F | Ht 68.75 in | Wt 155.5 lb

## 2015-03-27 DIAGNOSIS — N952 Postmenopausal atrophic vaginitis: Secondary | ICD-10-CM

## 2015-03-27 DIAGNOSIS — J301 Allergic rhinitis due to pollen: Secondary | ICD-10-CM

## 2015-03-27 DIAGNOSIS — H9193 Unspecified hearing loss, bilateral: Secondary | ICD-10-CM | POA: Diagnosis not present

## 2015-03-27 DIAGNOSIS — Z0000 Encounter for general adult medical examination without abnormal findings: Secondary | ICD-10-CM

## 2015-03-27 LAB — CBC WITH DIFFERENTIAL/PLATELET
BASOS PCT: 0.6 % (ref 0.0–3.0)
Basophils Absolute: 0 10*3/uL (ref 0.0–0.1)
EOS ABS: 0.2 10*3/uL (ref 0.0–0.7)
Eosinophils Relative: 2.6 % (ref 0.0–5.0)
HCT: 36.2 % (ref 36.0–46.0)
HEMOGLOBIN: 12.1 g/dL (ref 12.0–15.0)
Lymphocytes Relative: 21.4 % (ref 12.0–46.0)
Lymphs Abs: 1.6 10*3/uL (ref 0.7–4.0)
MCHC: 33.5 g/dL (ref 30.0–36.0)
MCV: 91.6 fl (ref 78.0–100.0)
MONOS PCT: 10.6 % (ref 3.0–12.0)
Monocytes Absolute: 0.8 10*3/uL (ref 0.1–1.0)
Neutro Abs: 4.9 10*3/uL (ref 1.4–7.7)
Neutrophils Relative %: 64.8 % (ref 43.0–77.0)
PLATELETS: 237 10*3/uL (ref 150.0–400.0)
RBC: 3.96 Mil/uL (ref 3.87–5.11)
RDW: 13.4 % (ref 11.5–15.5)
WBC: 7.5 10*3/uL (ref 4.0–10.5)

## 2015-03-27 LAB — BASIC METABOLIC PANEL
BUN: 14 mg/dL (ref 6–23)
CO2: 27 mEq/L (ref 19–32)
Calcium: 9.6 mg/dL (ref 8.4–10.5)
Chloride: 105 mEq/L (ref 96–112)
Creatinine, Ser: 0.85 mg/dL (ref 0.40–1.20)
GFR: 68.83 mL/min (ref >60.00)
Glucose, Bld: 98 mg/dL (ref 70–99)
Potassium: 4.3 mEq/L (ref 3.5–5.1)
SODIUM: 138 meq/L (ref 135–145)

## 2015-03-27 LAB — POCT URINALYSIS DIPSTICK
Bilirubin, UA: NEGATIVE
GLUCOSE UA: NEGATIVE
Ketones, UA: NEGATIVE
Nitrite, UA: NEGATIVE
PH UA: 5.5
PROTEIN UA: NEGATIVE
SPEC GRAV UA: 1.02
Urobilinogen, UA: 0.2

## 2015-03-27 LAB — TSH: TSH: 1.67 u[IU]/mL (ref 0.35–4.50)

## 2015-03-27 MED ORDER — ESTROGENS, CONJUGATED 0.625 MG/GM VA CREA: TOPICAL_CREAM | VAGINAL | Status: DC

## 2015-03-27 MED ORDER — AZELASTINE HCL 0.1 % NA SOLN: 1.0000 | Freq: Two times a day (BID) | NASAL | Status: DC

## 2015-03-27 NOTE — Progress Notes (Signed)
Pre visit review using our clinic review tool, if applicable. No additional management support is needed unless otherwise documented below in the visit note. 

## 2015-03-27 NOTE — Progress Notes (Signed)
   Subjective:    Patient ID: Sheila Armstrong, female    DOB: 1935-01-01, 80 y.o.   MRN: [id number]  HPI Sheila Armstrong is a 80 year old married female nonsmoker extremely healthy who comes in today for routine physical examination  She has a history of postmenopausal vaginal dryness and uses Premarin cream 3-4 times weekly. She uses Astelin nasal spray for allergic rhinitis  She gets routine eye care, dental care, BSE monthly, annual mammography, colonoscopy 2014 normal  Vaccinations up-to-date  Cognitive function normal she walks daily home health safety reviewed no issues identified, no guns in the house, she does have a healthcare power of attorney and living well   Review of Systems  Constitutional: Negative.   HENT: Negative.   Eyes: Negative.   Respiratory: Negative.   Cardiovascular: Negative.   Gastrointestinal: Negative.   Endocrine: Negative.   Genitourinary: Negative.   Musculoskeletal: Negative.   Skin: Negative.   Allergic/Immunologic: Negative.   Neurological: Negative.   Hematological: Negative.   Psychiatric/Behavioral: Negative.        Objective:   Physical Exam  Constitutional: She appears well-developed and well-nourished.  HENT:  Head: Normocephalic and atraumatic.  Right Ear: External ear normal.  Left Ear: External ear normal.  Nose: Nose normal.  Mouth/Throat: Oropharynx is clear and moist.  Eyes: EOM are normal. Pupils are equal, round, and reactive to light.  Neck: Normal range of motion. Neck supple. No JVD present. No tracheal deviation present. No thyromegaly present.  Cardiovascular: Normal rate, regular rhythm, normal heart sounds and intact distal pulses.  Exam reveals no gallop and no friction rub.   No murmur heard. Pulmonary/Chest: Effort normal and breath sounds normal. No stridor. No respiratory distress. She has no wheezes. She has no rales. She exhibits no tenderness.  Abdominal: Soft. Bowel sounds are normal. She exhibits no distension  and no mass. There is no tenderness. There is no rebound and no guarding.  Genitourinary:  Bilateral breast exam normal  Pelvic and Pap do next year.......Marland Kitchen every 3 years since she is asymptomatic and has no history of any cervical problems  Musculoskeletal: Normal range of motion.  Lymphadenopathy:    She has no cervical adenopathy.  Neurological: She is alert. She has normal reflexes. No cranial nerve deficit. She exhibits normal muscle tone. Coordination normal.  Skin: Skin is warm and dry. No rash noted. No erythema. No pallor.  Psychiatric: She has a normal mood and affect. Her behavior is normal. Judgment and thought content normal.          Assessment & Plan:  Healthy female  Bilateral hearing loss.... Just got some new hearing aids  Postmenopausal vaginal dryness continue hormonal cream

## 2015-03-27 NOTE — Patient Instructions (Signed)
Continue good diet and exercise  Follow-up in one year sooner if any problems  Diplomatic Services operational officer...........Marland Kitchen our new adult nurse practitioner from [facility]

## 2015-09-03 ENCOUNTER — Ambulatory Visit (INDEPENDENT_AMBULATORY_CARE_PROVIDER_SITE_OTHER): Payer: Medicare Other | Admitting: *Deleted

## 2015-09-03 DIAGNOSIS — Z23 Encounter for immunization: Secondary | ICD-10-CM | POA: Diagnosis not present

## 2015-10-31 ENCOUNTER — Telehealth: Payer: Self-pay | Admitting: Family Medicine

## 2015-10-31 NOTE — Telephone Encounter (Signed)
Sheila Armstrong called saying she's been experiencing urinary leakage for six months. She's wondering if she needs to schedule an appointment or if there's something she can do on her own right now. She'd like a phone call regarding this.  Pt's ph# [phone] Thank you.

## 2015-11-01 NOTE — Telephone Encounter (Signed)
Left a message for the pt to call back and schedule an appt. Thank you.

## 2015-11-01 NOTE — Telephone Encounter (Signed)
Please call and schedule an appointment for the patient.  If she has a GYN she could also/or make an appointment with them.

## 2015-11-22 ENCOUNTER — Telehealth: Payer: Self-pay | Admitting: Family Medicine

## 2015-11-22 NOTE — Telephone Encounter (Signed)
Noted  

## 2015-11-22 NOTE — Telephone Encounter (Signed)
Used SDA FOR 12/04/15

## 2015-12-04 ENCOUNTER — Encounter: Payer: Self-pay | Admitting: Family Medicine

## 2015-12-04 ENCOUNTER — Ambulatory Visit (INDEPENDENT_AMBULATORY_CARE_PROVIDER_SITE_OTHER): Payer: Medicare Other | Admitting: Family Medicine

## 2015-12-04 VITALS — BP 120/78 | Temp 98.0°F | Wt 157.0 lb

## 2015-12-04 DIAGNOSIS — R32 Unspecified urinary incontinence: Secondary | ICD-10-CM | POA: Insufficient documentation

## 2015-12-04 DIAGNOSIS — N39498 Other specified urinary incontinence: Secondary | ICD-10-CM | POA: Diagnosis not present

## 2015-12-04 LAB — BASIC METABOLIC PANEL
BUN: 16 mg/dL (ref 6–23)
CHLORIDE: 98 meq/L (ref 96–112)
CO2: 27 meq/L (ref 19–32)
Calcium: 9.1 mg/dL (ref 8.4–10.5)
Creatinine, Ser: 0.89 mg/dL (ref 0.40–1.20)
GFR: 65.16 mL/min (ref >60.00)
GLUCOSE: 92 mg/dL (ref 70–99)
POTASSIUM: 4.2 meq/L (ref 3.5–5.1)
Sodium: 134 mEq/L — ABNORMAL LOW (ref 135–145)

## 2015-12-04 LAB — POCT URINALYSIS DIPSTICK
BILIRUBIN UA: NEGATIVE
GLUCOSE UA: NEGATIVE
Ketones, UA: NEGATIVE
NITRITE UA: NEGATIVE
Protein, UA: NEGATIVE
Spec Grav, UA: <=1.005
Urobilinogen, UA: 0.2
pH, UA: 6

## 2015-12-04 LAB — CBC WITH DIFFERENTIAL/PLATELET
BASOS PCT: 0.4 % (ref 0.0–3.0)
Basophils Absolute: 0 10*3/uL (ref 0.0–0.1)
EOS PCT: 2.3 % (ref 0.0–5.0)
Eosinophils Absolute: 0.2 10*3/uL (ref 0.0–0.7)
HCT: 36.1 % (ref 36.0–46.0)
HEMOGLOBIN: 11.9 g/dL — AB (ref 12.0–15.0)
LYMPHS ABS: 1.6 10*3/uL (ref 0.7–4.0)
Lymphocytes Relative: 19.7 % (ref 12.0–46.0)
MCHC: 33 g/dL (ref 30.0–36.0)
MCV: 92.7 fl (ref 78.0–100.0)
MONO ABS: 0.7 10*3/uL (ref 0.1–1.0)
Monocytes Relative: 8.2 % (ref 3.0–12.0)
NEUTROS ABS: 5.6 10*3/uL (ref 1.4–7.7)
NEUTROS PCT: 69.4 % (ref 43.0–77.0)
Platelets: 248 10*3/uL (ref 150.0–400.0)
RBC: 3.89 Mil/uL (ref 3.87–5.11)
RDW: 13.9 % (ref 11.5–15.5)
WBC: 8.1 10*3/uL (ref 4.0–10.5)

## 2015-12-04 NOTE — Progress Notes (Signed)
Pre visit review using our clinic review tool, if applicable. No additional management support is needed unless otherwise documented below in the visit note. 

## 2015-12-04 NOTE — Patient Instructions (Signed)
Labs today.............. I will call you the report tomorrow and will go from there.

## 2015-12-04 NOTE — Progress Notes (Signed)
   Subjective:    Patient ID: Sheila Armstrong, female    DOB: [birth year], 81 y.o.   MRN: [id number]  HPI Sheila Armstrong is a 81 year old female nonsmoker who comes in today for evaluation of urinary leakage  She states her symptoms started about 4 months ago. She says she notices when she urinates there is debris in the toilet. She has no fever chills back pain nausea vomiting or diarrhea. LMP was at age 40. She's never had trouble with uterus or ovaries. Last Pap a year ago was normal.  2 weeks ago she try to get in here but we were booked. She went to see her allergist. She was given a shot of steroids and some antibiotics for sinusitis. That treatment did not alter her urinary tract symptoms. She says she can go 4-5 hours and not urinate and only has nocturia maybe once at night. It's not really a leakage problem it's a debris problem that she notices.   Review of Systems Review of systems otherwise negative    Objective:   Physical Exam  Well-developed well-nourished female no acute distress vital signs stable she's afebrile examination the abdomen and is flat bowel sounds are normal liver screening kidneys not enlarged I can appreciate no masses or tenderness.  Pelvic examination external genitalia appropriately for age. Speculum exam shows normal vaginal but dry vault. Cervix was visualized bimanual exam shows no tenderness or masses. Urethra appears normal      Assessment & Plan:  Symptoms of urinary leakage but more of debris after she urinates etiology unknown..........Marland Kitchen begin workup

## 2016-06-18 ENCOUNTER — Other Ambulatory Visit: Payer: Self-pay | Admitting: Family Medicine

## 2016-06-18 DIAGNOSIS — Z1231 Encounter for screening mammogram for malignant neoplasm of breast: Secondary | ICD-10-CM

## 2016-06-30 ENCOUNTER — Ambulatory Visit
Admission: RE | Admit: 2016-06-30 | Discharge: 2016-06-30 | Disposition: A | Discharge status: Home / Self Care | Payer: Medicare Other | Source: Ambulatory Visit | Attending: Family Medicine | Admitting: Family Medicine

## 2016-06-30 DIAGNOSIS — Z1231 Encounter for screening mammogram for malignant neoplasm of breast: Secondary | ICD-10-CM

## 2016-07-22 ENCOUNTER — Ambulatory Visit (INDEPENDENT_AMBULATORY_CARE_PROVIDER_SITE_OTHER): Payer: Medicare Other | Admitting: Family Medicine

## 2016-07-22 ENCOUNTER — Encounter: Payer: Self-pay | Admitting: Family Medicine

## 2016-07-22 VITALS — BP 112/70 | HR 107 | Temp 97.8°F | Wt 159.5 lb

## 2016-07-22 DIAGNOSIS — N952 Postmenopausal atrophic vaginitis: Secondary | ICD-10-CM | POA: Diagnosis not present

## 2016-07-22 DIAGNOSIS — J3089 Other allergic rhinitis: Secondary | ICD-10-CM

## 2016-07-22 DIAGNOSIS — Z23 Encounter for immunization: Secondary | ICD-10-CM

## 2016-07-22 LAB — POCT URINALYSIS DIPSTICK
BILIRUBIN UA: NEGATIVE
GLUCOSE UA: NEGATIVE
KETONES UA: NEGATIVE
Nitrite, UA: NEGATIVE
Protein, UA: NEGATIVE
Spec Grav, UA: 1.01
Urobilinogen, UA: 0.2
pH, UA: 5.5

## 2016-07-22 LAB — CBC WITH DIFFERENTIAL/PLATELET
BASOS ABS: 0 10*3/uL (ref 0.0–0.1)
Basophils Relative: 0.4 % (ref 0.0–3.0)
Eosinophils Absolute: 0.2 10*3/uL (ref 0.0–0.7)
Eosinophils Relative: 3.8 % (ref 0.0–5.0)
HEMATOCRIT: 36.4 % (ref 36.0–46.0)
HEMOGLOBIN: 12.4 g/dL (ref 12.0–15.0)
LYMPHS PCT: 25 % (ref 12.0–46.0)
Lymphs Abs: 1.6 10*3/uL (ref 0.7–4.0)
MCHC: 34.2 g/dL (ref 30.0–36.0)
MCV: 90.9 fl (ref 78.0–100.0)
MONOS PCT: 6.6 % (ref 3.0–12.0)
Monocytes Absolute: 0.4 10*3/uL (ref 0.1–1.0)
NEUTROS ABS: 4.1 10*3/uL (ref 1.4–7.7)
Neutrophils Relative %: 64.2 % (ref 43.0–77.0)
PLATELETS: 251 10*3/uL (ref 150.0–400.0)
RBC: 4 Mil/uL (ref 3.87–5.11)
RDW: 13.9 % (ref 11.5–15.5)
WBC: 6.4 10*3/uL (ref 4.0–10.5)

## 2016-07-22 LAB — BASIC METABOLIC PANEL
BUN: 12 mg/dL (ref 6–23)
CHLORIDE: 103 meq/L (ref 96–112)
CO2: 29 meq/L (ref 19–32)
CREATININE: 0.83 mg/dL (ref 0.40–1.20)
Calcium: 9.4 mg/dL (ref 8.4–10.5)
GFR: 70.51 mL/min (ref >60.00)
GLUCOSE: 96 mg/dL (ref 70–99)
Potassium: 4.1 mEq/L (ref 3.5–5.1)
Sodium: 137 mEq/L (ref 135–145)

## 2016-07-22 LAB — TSH: TSH: 2.09 u[IU]/mL (ref 0.35–4.50)

## 2016-07-22 MED ORDER — ESTROGENS, CONJUGATED 0.625 MG/GM VA CREA: TOPICAL_CREAM | VAGINAL | 11 refills | Status: DC

## 2016-07-22 NOTE — Progress Notes (Signed)
Pre visit review using our clinic review tool, if applicable. No additional management support is needed unless otherwise documented below in the visit note. 

## 2016-07-22 NOTE — Patient Instructions (Signed)
Continue current medications  Set up a physical examination with Sheila Armstrong because you do fear exam and he can provide long-term care  Labs today...Marland KitchenMarland KitchenMarland Kitchen I will call you if there is anything abnormal

## 2016-07-22 NOTE — Progress Notes (Signed)
Sheila Armstrong is a 81 year old female nonsmoker who comes in today for follow-up of multiple issues  She has difficulty hearing today because her left hearing aid is broken. Screen sent back for repair  She had a parotid duct stone that was problematic until the orifice was lasered by her dentist. The stone popped out. That problem has resolved  She is permanent cream small amounts 2-3 times weekly because of vaginal dryness  She has going to PT recently because of bursitis left and right hip with Dr. Archie Endo been treating that.  She stopped he has some nasal spray for allergic rhinitis and uses Flonase.  She's due for a annual physical examination  Flu shot given today  Vital signs stable she's afebrile  Impression #1 allergic rhinitis........... continue Flonase discontinue Astelin  #2 postmenopausal vaginal dryness.......Marland Kitchen refill Premarin cream  #3 hearing loss......... continued follow-up by [facility]  Osteoarthritis right and left hip........... continue follow-up

## 2016-07-24 ENCOUNTER — Ambulatory Visit (INDEPENDENT_AMBULATORY_CARE_PROVIDER_SITE_OTHER): Payer: Medicare Other

## 2016-07-24 DIAGNOSIS — Z23 Encounter for immunization: Secondary | ICD-10-CM

## 2016-08-07 ENCOUNTER — Telehealth: Payer: Self-pay | Admitting: Family Medicine

## 2016-08-07 NOTE — Telephone Encounter (Signed)
Will advise Dr.Todd of pt's request upon his return Monday morning.

## 2016-08-07 NOTE — Telephone Encounter (Signed)
Pt would like a dexa scan. Pt has an appt with Cory on 10/17 and wants it done before then. Pt just saw Dr Sherren Mocha 9/12.  Will he be willing to put the order in?

## 2016-08-11 ENCOUNTER — Other Ambulatory Visit: Payer: Self-pay | Admitting: Emergency Medicine

## 2016-08-11 DIAGNOSIS — M8580 Other specified disorders of bone density and structure, unspecified site: Secondary | ICD-10-CM

## 2016-08-11 NOTE — Telephone Encounter (Signed)
Per Dr.Todd okay to place order. Order placed called and spoke with pt informing her that someone would contact her to schedule an appointment. Pt verbalized understanding.

## 2016-08-26 ENCOUNTER — Ambulatory Visit (INDEPENDENT_AMBULATORY_CARE_PROVIDER_SITE_OTHER): Payer: Medicare Other | Admitting: Adult Health

## 2016-08-26 ENCOUNTER — Encounter: Payer: Self-pay | Admitting: Adult Health

## 2016-08-26 VITALS — BP 124/64 | Temp 98.6°F | Ht 68.75 in | Wt 157.6 lb

## 2016-08-26 DIAGNOSIS — J209 Acute bronchitis, unspecified: Secondary | ICD-10-CM

## 2016-08-26 DIAGNOSIS — Z7689 Persons encountering health services in other specified circumstances: Secondary | ICD-10-CM | POA: Diagnosis not present

## 2016-08-26 MED ORDER — PREDNISONE 10 MG PO TABS: ORAL_TABLET | ORAL | 0 refills | Status: DC

## 2016-08-26 MED ORDER — HYDROCODONE-HOMATROPINE 5-1.5 MG/5ML PO SYRP: 5.0000 mL | ORAL_SOLUTION | Freq: Three times a day (TID) | ORAL | 0 refills | Status: DC | PRN

## 2016-08-26 NOTE — Patient Instructions (Addendum)
It was great meeting you today!  Please follow up with me in September for your next physical exam   Your exam is consistent with bronchitis. I have sent in a prescription for Prednisone, use as directed. You can also use the cough syrup at night.   Mucinex is the best thing for daytime.   Please let me know if you are not feeling any better afterwards   Acute Bronchitis Bronchitis is inflammation of the airways that extend from the windpipe into the lungs (bronchi). The inflammation often causes mucus to develop. This leads to a cough, which is the most common symptom of bronchitis.  In acute bronchitis, the condition usually develops suddenly and goes away over time, usually in a couple weeks. Smoking, allergies, and asthma can make bronchitis worse. Repeated episodes of bronchitis may cause further lung problems.  CAUSES Acute bronchitis is most often caused by the same virus that causes a cold. The virus can spread from person to person (contagious) through coughing, sneezing, and touching contaminated objects. SIGNS AND SYMPTOMS   Cough.   Fever.   Coughing up mucus.   Body aches.   Chest congestion.   Chills.   Shortness of breath.   Sore throat.  DIAGNOSIS  Acute bronchitis is usually diagnosed through a physical exam. Your health care provider will also ask you questions about your medical history. Tests, such as chest X-rays, are sometimes done to rule out other conditions.  TREATMENT  Acute bronchitis usually goes away in a couple weeks. Oftentimes, no medical treatment is necessary. Medicines are sometimes given for relief of fever or cough. Antibiotic medicines are usually not needed but may be prescribed in certain situations. In some cases, an inhaler may be recommended to help reduce shortness of breath and control the cough. A cool mist vaporizer may also be used to help thin bronchial secretions and make it easier to clear the chest.  HOME CARE  INSTRUCTIONS  Get plenty of rest.   Drink enough fluids to keep your urine clear or pale yellow (unless you have a medical condition that requires fluid restriction). Increasing fluids may help thin your respiratory secretions (sputum) and reduce chest congestion, and it will prevent dehydration.   Take medicines only as directed by your health care provider.  If you were prescribed an antibiotic medicine, finish it all even if you start to feel better.  Avoid smoking and secondhand smoke. Exposure to cigarette smoke or irritating chemicals will make bronchitis worse. If you are a smoker, consider using nicotine gum or skin patches to help control withdrawal symptoms. Quitting smoking will help your lungs heal faster.   Reduce the chances of another bout of acute bronchitis by washing your hands frequently, avoiding people with cold symptoms, and trying not to touch your hands to your mouth, nose, or eyes.   Keep all follow-up visits as directed by your health care provider.  SEEK MEDICAL CARE IF: Your symptoms do not improve after 1 week of treatment.  SEEK IMMEDIATE MEDICAL CARE IF:  You develop an increased fever or chills.   You have chest pain.   You have severe shortness of breath.  You have bloody sputum.   You develop dehydration.  You faint or repeatedly feel like you are going to pass out.  You develop repeated vomiting.  You develop a severe headache. MAKE SURE YOU:   Understand these instructions.  Will watch your condition.  Will get help right away if you are not doing  well or get worse.   This information is not intended to replace advice given to you by your health care provider. Make sure you discuss any questions you have with your health care provider.   Document Released: 12/04/2004 Document Revised: 11/17/2014 Document Reviewed: 04/19/2013 Elsevier Interactive Patient Education [employer].

## 2016-08-26 NOTE — Progress Notes (Signed)
Patient presents to clinic today to establish care. She is a pleasant 81 year old female who  has a past medical history of ALLERGIC RHINITIS (07/07/2007); COLONIC POLYPS, ADENOMATOUS, HX OF (11/19/2005); CONSTIPATION (12/31/2009); DIVERTICULOSIS, COLON (07/12/2008); Family history of colon cancer; Hearing loss; SEBORRHEIC KERATOSIS, INFLAMED (03/21/2008); and SENILE VAGINITIS (01/17/2008).  She had her last physical in September 2017 with MD Sherren Mocha  Acute Concerns: She is complaining of a dry cough since Thursday. She denies any wheezing. This cough is keeping her up at night. She does feel like she has PND. Denies fevers. She has been using Robitussin, which does not seem to be working.   Chronic Issues: - Seasonal Allergies - She reports that she is seen by the allergy clinic   Health Maintenance: Dental --Routine  Vision -- Routine  Immunizations -- UTD  Colonoscopy -- 2014 - No longer needs  Mammogram -- 06/2016  PAP -- No longer needs  Bone Density --  Has one next week  Diet: Eats a heart healthy diet Exercise: Regular exercise throughout the week   She is followed by: - Allergy and Asthma  - Dr. Romie Levee    Past Medical History:  Diagnosis Date  . ALLERGIC RHINITIS 07/07/2007  . COLONIC POLYPS, ADENOMATOUS, HX OF 11/19/2005  . CONSTIPATION 12/31/2009  . DIVERTICULOSIS, COLON 07/12/2008  . Family history of colon cancer   . SEBORRHEIC KERATOSIS, INFLAMED 03/21/2008  . SENILE VAGINITIS 01/17/2008    Past Surgical History:  Procedure Laterality Date  . DILATION AND CURETTAGE OF UTERUS    . EYELID IMPLANT     right and left  . RHINOPLASTY      Current Outpatient Prescriptions on File Prior to Visit  Medication Sig Dispense Refill  . conjugated estrogens (PREMARIN) vaginal cream Small amounts twice weekly 45 g 11  . Probiotic Product (ALIGN) 4 MG CAPS Take 1 tablet by mouth daily.     No current facility-administered medications on file prior to visit.     Allergies    Allergen Reactions  . Sulfonamide Derivatives     REACTION: hives    Family History  Problem Relation Age of Onset  . Anuerysm Sister   . Colon cancer Mother 79    Social History   Social History  . Marital status: Married    Spouse name: N/A  . Number of children: N/A  . Years of education: N/A   Occupational History  . Not on file.   Social History Main Topics  . Smoking status: Never Smoker  . Smokeless tobacco: Never Used  . Alcohol use No  . Drug use: No  . Sexual activity: Not on file   Other Topics Concern  . Not on file   Social History Narrative  . No narrative on file    Review of Systems  Constitutional: Negative.   HENT: Negative.   Eyes: Negative.   Respiratory: Positive for cough. Negative for sputum production, shortness of breath and wheezing.   Cardiovascular: Negative.   Gastrointestinal: Negative.   Genitourinary: Negative.   Musculoskeletal: Negative.   Skin: Negative.   Neurological: Negative.   Psychiatric/Behavioral: Negative.   All other systems reviewed and are negative.   BP 124/64   Temp 98.6 F (37 C) (Oral)   Ht 5' 8.75" (1.746 m)   Wt 157 lb 9.6 oz (71.5 kg)   BMI 23.44 kg/m   Physical Exam  Constitutional: She is oriented to person, place, and time and well-developed, well-nourished, and in  no distress. No distress.  HENT:  Head: Normocephalic and atraumatic.  Right Ear: External ear normal.  Left Ear: External ear normal.  Nose: Rhinorrhea present. No mucosal edema. Right sinus exhibits no maxillary sinus tenderness and no frontal sinus tenderness. Left sinus exhibits no maxillary sinus tenderness and no frontal sinus tenderness.  Mouth/Throat: Oropharynx is clear and moist. No oropharyngeal exudate.  Cardiovascular: Normal rate, regular rhythm and normal heart sounds.  Exam reveals no gallop and no friction rub.   No murmur heard. Pulmonary/Chest: Effort normal. No respiratory distress. She has wheezes (at bases  ). She has no rales. She exhibits no tenderness.  Abdominal: Soft. Bowel sounds are normal. She exhibits no distension and no mass. There is no tenderness. There is no rebound and no guarding.  Neurological: She is alert and oriented to person, place, and time. Gait normal. GCS score is 15.  Skin: Skin is warm and dry. No rash noted. She is not diaphoretic. No erythema. No pallor.  Psychiatric: Mood, memory, affect and judgment normal.  Nursing note and vitals reviewed.   Recent Results (from the past 2160 hour(s))  CBC with Differential/Platelet     Status: None   Collection Time: 07/22/16 10:18 AM  Result Value Ref Range   WBC 6.4 4.0 - 10.5 K/uL   RBC 4.00 3.87 - 5.11 Mil/uL   Hemoglobin 12.4 12.0 - 15.0 g/dL   HCT 36.4 36.0 - 46.0 %   MCV 90.9 78.0 - 100.0 fl   MCHC 34.2 30.0 - 36.0 g/dL   RDW 13.9 11.5 - 15.5 %   Platelets 251.0 150.0 - 400.0 K/uL   Neutrophils Relative % 64.2 43.0 - 77.0 %   Lymphocytes Relative 25.0 12.0 - 46.0 %   Monocytes Relative 6.6 3.0 - 12.0 %   Eosinophils Relative 3.8 0.0 - 5.0 %   Basophils Relative 0.4 0.0 - 3.0 %   Neutro Abs 4.1 1.4 - 7.7 K/uL   Lymphs Abs 1.6 0.7 - 4.0 K/uL   Monocytes Absolute 0.4 0.1 - 1.0 K/uL   Eosinophils Absolute 0.2 0.0 - 0.7 K/uL   Basophils Absolute 0.0 0.0 - 0.1 K/uL  Basic metabolic panel     Status: None   Collection Time: 07/22/16 10:18 AM  Result Value Ref Range   Sodium 137 135 - 145 mEq/L   Potassium 4.1 3.5 - 5.1 mEq/L   Chloride 103 96 - 112 mEq/L   CO2 29 19 - 32 mEq/L   Glucose, Bld 96 70 - 99 mg/dL   BUN 12 6 - 23 mg/dL   Creatinine, Ser 0.83 0.40 - 1.20 mg/dL   Calcium 9.4 8.4 - 10.5 mg/dL   GFR 70.51 >60.00 mL/min  TSH     Status: None   Collection Time: 07/22/16 10:18 AM  Result Value Ref Range   TSH 2.09 0.35 - 4.50 uIU/mL  POCT urinalysis dipstick     Status: Abnormal   Collection Time: 07/22/16  1:12 PM  Result Value Ref Range   Color, UA yellow    Clarity, UA clear    Glucose, UA n     Bilirubin, UA n    Ketones, UA n    Spec Grav, UA 1.010    Blood, UA trace-lysed    pH, UA 5.5    Protein, UA n    Urobilinogen, UA 0.2    Nitrite, UA n    Leukocytes, UA moderate (2+) (A) Negative    Assessment/Plan: 1. Encounter to establish care -  Follow up in one year for CPE - Continue to eat healthy and exercise - Follow up for any acute issues as needed  2. Acute bronchitis, unspecified organism - HYDROcodone-homatropine (HYCODAN) 5-1.5 MG/5ML syrup; Take 5 mLs by mouth every 8 (eight) hours as needed for cough.  Dispense: 120 mL; Refill: 0 - predniSONE (DELTASONE) 10 MG tablet; 40mg  x 3 days, 20 mg x 3 days, 10 mg x 3 days  Dispense: 21 tablet; Refill: 0 - Mucinex OTC during the day - Follow up if no improvement  Sheila Peng, NP

## 2016-09-01 ENCOUNTER — Ambulatory Visit (INDEPENDENT_AMBULATORY_CARE_PROVIDER_SITE_OTHER)
Admission: RE | Admit: 2016-09-01 | Discharge: 2016-09-01 | Disposition: A | Discharge status: Home / Self Care | Payer: Medicare Other | Source: Ambulatory Visit | Attending: Adult Health | Admitting: Adult Health

## 2016-09-01 DIAGNOSIS — M8580 Other specified disorders of bone density and structure, unspecified site: Secondary | ICD-10-CM | POA: Diagnosis not present

## 2016-09-09 ENCOUNTER — Telehealth: Payer: Self-pay | Admitting: *Deleted

## 2016-09-09 NOTE — Telephone Encounter (Signed)
[place] pt would like for you to call her on her cell phone due to it being paired with her hearing aids.  Cell phone # [phone].

## 2016-09-09 NOTE — Telephone Encounter (Signed)
-----   Message from Dorena Cookey, MD sent at 09/08/2016  9:01 AM EDT ----- Please call her bone density still some minor bone loss. Recommendations her daily walking 1500 mg of calcium and 800 units of vitamin D daily follow-up bone density in 2 years

## 2016-09-09 NOTE — Telephone Encounter (Signed)
Called and spoke with pt informing her of Dr. Sherren Mocha results and recommendations. Pt verbalized understanding nothing further needed at this time.

## 2016-09-09 NOTE — Telephone Encounter (Signed)
Pt following up on results.  Please remember to call on cell phone due to hearing aids.

## 2016-09-09 NOTE — Telephone Encounter (Signed)
Called and spoke with pt. Pt states she is having a horrible time with chest congestion and will call us back is not feeling like talking. I tried to inform pt that I was calling with her lab results but pt hung up.

## 2016-09-24 ENCOUNTER — Telehealth: Payer: Self-pay | Admitting: Adult Health

## 2016-09-24 NOTE — Telephone Encounter (Signed)
Noted  

## 2016-09-24 NOTE — Telephone Encounter (Signed)
Patient scheduled with Dr. Sherren Mocha 11/20

## 2016-09-24 NOTE — Telephone Encounter (Signed)
[facility] - Client [place] [facility]  Patient Name: Sheila Armstrong  DOB: 02-Sep-1935    Initial Comment Caller states has bladder debris, feels unclean, no pain   Nurse Assessment  Nurse: Wayne Sever, RN, Tillie Rung Date/Time (Eastern Time): 09/24/2016 10:36:08 AM  Confirm and document reason for call. If symptomatic, describe symptoms. You must click the next button to save text entered. ---Caller states she is having cloudy urine with debris. Denies any pain. Denies any fever.  Has the patient traveled out of the country within the last 30 days? ---Not Applicable  Does the patient have any new or worsening symptoms? ---Yes  Will a triage be completed? ---Yes  Related visit to physician within the last 2 weeks? ---No  Does the PT have any chronic conditions? (i.e. diabetes, asthma, etc.) ---No  Is this a behavioral health or substance abuse call? ---No     Guidelines    Guideline Title Affirmed Question Affirmed Notes  Urinary Symptoms All other urine symptoms    Final Disposition User   See PCP within [place], RN, Tillie Rung    Comments  Scheduled with Dr Sherren Mocha on 11/20 at 1045   Referrals  REFERRED TO PCP OFFICE   Disagree/Comply: Comply

## 2016-09-29 ENCOUNTER — Ambulatory Visit (INDEPENDENT_AMBULATORY_CARE_PROVIDER_SITE_OTHER): Payer: Medicare Other | Admitting: Family Medicine

## 2016-09-29 ENCOUNTER — Encounter: Payer: Self-pay | Admitting: Family Medicine

## 2016-09-29 VITALS — BP 130/70 | HR 122 | Temp 98.9°F | Ht 68.75 in | Wt 151.9 lb

## 2016-09-29 DIAGNOSIS — R8290 Unspecified abnormal findings in urine: Secondary | ICD-10-CM

## 2016-09-29 DIAGNOSIS — N3001 Acute cystitis with hematuria: Secondary | ICD-10-CM | POA: Diagnosis not present

## 2016-09-29 LAB — POC URINALSYSI DIPSTICK (AUTOMATED)
Glucose, UA: NEGATIVE
NITRITE UA: NEGATIVE
PH UA: 5.5
Spec Grav, UA: 1.015
UROBILINOGEN UA: 0.2

## 2016-09-29 MED ORDER — CEPHALEXIN 500 MG PO CAPS: ORAL_CAPSULE | ORAL | 1 refills | Status: DC

## 2016-09-29 NOTE — Progress Notes (Signed)
Sheila Armstrong is a 81 year old female who comes in today for evaluation of cloudy urine  She said this started about 2 or 3 months ago. She said no fever chills back pain. She has nocturia 1. Last urine tract infection was about 40 years ago. Review of systems otherwise negative  Vital signs stable she's afebrile examination the abdomen was negative. Urinalysis shows 2+ blood 2+ protein negative nitrate positive white cells. Culture pending  Medication history she has had hives with sulfa drugs  Impression urine tract infection.......Marland Kitchen Keflex 1 g twice a day........ culture pending

## 2016-09-29 NOTE — Patient Instructions (Signed)
Keflex 500 mg......... 2 tabs twice daily  Drink lots of water  We will call you the report of urine culture if we need to change medication

## 2016-09-30 LAB — URINE CULTURE: ORGANISM ID, BACTERIA: NO GROWTH

## 2016-11-04 ENCOUNTER — Telehealth: Payer: Self-pay | Admitting: Adult Health

## 2016-11-04 DIAGNOSIS — N390 Urinary tract infection, site not specified: Secondary | ICD-10-CM

## 2016-11-04 NOTE — Telephone Encounter (Signed)
Referral placed.

## 2016-11-04 NOTE — Telephone Encounter (Signed)
referral ok

## 2016-11-04 NOTE — Telephone Encounter (Signed)
Pt would like to have a referral to a urologist for the continuous UTI that she is having.

## 2016-11-04 NOTE — Telephone Encounter (Signed)
Referral okay? 

## 2016-11-25 ENCOUNTER — Encounter: Payer: Self-pay | Admitting: Adult Health

## 2016-11-25 ENCOUNTER — Ambulatory Visit (INDEPENDENT_AMBULATORY_CARE_PROVIDER_SITE_OTHER): Payer: Medicare Other | Admitting: Adult Health

## 2016-11-25 ENCOUNTER — Ambulatory Visit: Payer: Medicare Other | Admitting: Adult Health

## 2016-11-25 VITALS — BP 124/62 | HR 101 | Temp 98.0°F | Ht 68.75 in | Wt 158.0 lb

## 2016-11-25 DIAGNOSIS — J069 Acute upper respiratory infection, unspecified: Secondary | ICD-10-CM

## 2016-11-25 MED ORDER — MAGIC MOUTHWASH W/LIDOCAINE: 5.0000 mL | Freq: Three times a day (TID) | ORAL | 0 refills | Status: DC | PRN

## 2016-11-25 NOTE — Progress Notes (Signed)
Subjective:    Patient ID: Sheila Armstrong, female    DOB: [birth year], 82 y.o.   MRN: [id number]  URI   This is a new problem. The current episode started in the past 7 days (3 days ). There has been no fever. Associated symptoms include congestion, coughing (productive ), headaches, rhinorrhea, sinus pain and a sore throat. Pertinent negatives include no nausea. Treatments tried: Flonase       Review of Systems  HENT: Positive for congestion, postnasal drip, rhinorrhea, sinus pain, sinus pressure and sore throat.   Respiratory: Positive for cough (productive ).   Cardiovascular: Negative.   Gastrointestinal: Negative.  Negative for nausea.  Neurological: Positive for headaches.   Past Medical History:  Diagnosis Date  . ALLERGIC RHINITIS 07/07/2007  . COLONIC POLYPS, ADENOMATOUS, HX OF 11/19/2005  . CONSTIPATION 12/31/2009  . DIVERTICULOSIS, COLON 07/12/2008  . Family history of colon cancer   . Hearing loss   . SEBORRHEIC KERATOSIS, INFLAMED 03/21/2008  . SENILE VAGINITIS 01/17/2008    Social History   Social History  . Marital status: Married    Spouse name: N/A  . Number of children: N/A  . Years of education: N/A   Occupational History  . Not on file.   Social History Main Topics  . Smoking status: Never Smoker  . Smokeless tobacco: Never Used  . Alcohol use No  . Drug use: No  . Sexual activity: Not on file   Other Topics Concern  . 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 DILATION AND CURETTAGE OF UTERUS    . EYELID IMPLANT     right and left  . RHINOPLASTY      Family History  Problem Relation Age of Onset  . Colon cancer Mother 81  . Anuerysm Sister     Allergies  Allergen Reactions  . Sulfonamide Derivatives     REACTION: hives    Current Outpatient  Prescriptions on File Prior to Visit  Medication Sig Dispense Refill  . conjugated estrogens (PREMARIN) vaginal cream Small amounts twice weekly 45 g 11  . Probiotic Product (ALIGN) 4 MG CAPS Take 1 tablet by mouth daily.     No current facility-administered medications on file prior to visit.     BP 124/62   Pulse (!) 101   Temp 98 F (36.7 C) (Oral)   Ht 5' 8.75" (1.746 m)   Wt 158 lb (71.7 kg)   SpO2 97%   BMI 23.50 kg/m       Objective:   Physical Exam  Constitutional: She is oriented to person, place, and time. She appears well-developed and well-nourished. No distress.  HENT:  Head: Normocephalic and atraumatic.  Right Ear: Hearing, tympanic membrane, external ear and ear canal normal.  Left Ear: Hearing, tympanic membrane, external ear and ear canal normal.  Nose: Mucosal edema and rhinorrhea present. Right sinus exhibits no maxillary sinus tenderness and no frontal sinus tenderness. Left sinus exhibits no maxillary sinus tenderness and no frontal sinus tenderness.  Mouth/Throat: Uvula is midline. Oropharyngeal exudate present.  Eyes: Conjunctivae and EOM are normal. Pupils are equal, round, and reactive to light. Right eye exhibits no discharge. Left eye exhibits no discharge. No scleral icterus.  Neck: Normal range of motion. Neck supple. No thyromegaly present.  Cardiovascular: Normal rate, regular rhythm, normal heart sounds and intact distal pulses.  Exam reveals no gallop and no friction rub.   No murmur heard. Pulmonary/Chest: Breath sounds normal. No respiratory distress. She has no wheezes. She has no rales. She exhibits no tenderness.  Neurological: She is oriented to person, place, and time.  Skin: Skin is warm and dry. No rash noted. She is not diaphoretic. No erythema. No pallor.  Psychiatric: She has a normal mood and affect. Her behavior is normal. Thought content normal.  Nursing note and vitals reviewed.     Assessment & Plan:  1. Acute upper  respiratory infection - Appears viral in nature. Continue with current therapy. Can add Mucinex to help with cough  - magic mouthwash w/lidocaine SOLN; Take 5 mLs by mouth 3 (three) times daily as needed.  Dispense: 180 mL; Refill: 0 - Follow up if no improvement in the next 2-3 days   Dorothyann Peng, NP

## 2017-01-15 ENCOUNTER — Ambulatory Visit (INDEPENDENT_AMBULATORY_CARE_PROVIDER_SITE_OTHER): Payer: Medicare Other | Admitting: Family Medicine

## 2017-01-15 ENCOUNTER — Encounter: Payer: Self-pay | Admitting: Family Medicine

## 2017-01-15 VITALS — BP 118/84 | HR 92 | Temp 98.0°F | Wt 153.8 lb

## 2017-01-15 DIAGNOSIS — N950 Postmenopausal bleeding: Secondary | ICD-10-CM

## 2017-01-15 NOTE — Progress Notes (Signed)
Pre visit review using our clinic review tool, if applicable. No additional management support is needed unless otherwise documented below in the visit note. 

## 2017-01-15 NOTE — Patient Instructions (Addendum)
It was a pleasure seeing you today!  A referral has been placed for an evaluation from gynecology. As you have had lab work yesterday, no lab work will be collected today. Please contact this office if you have not heard from the referral coordinator in 7 to 10 days.  Also, please follow up with urology as directed and if bleeding increases, please follow up for further evaluation.   Postmenopausal Bleeding Postmenopausal bleeding is any bleeding after menopause. Menopause is when a woman's period stops. Any type of bleeding after menopause is concerning. It should be checked by your doctor. Any treatment will depend on the cause. Follow these instructions at home: Watch your condition for any changes.  Avoid the use of tampons and douches as told by your doctor.  Change your pads often.  Get regular pelvic exams and Pap tests.  Keep all appointments for tests as told by your doctor. Contact a doctor if:  Your bleeding lasts for more than 1 week.  You have belly (abdominal) pain.  You have bleeding after sex (intercourse). Get help right away if:  You have a fever, chills, a headache, dizziness, muscle aches, and bleeding.  You have strong pain with bleeding.  You have clumps of blood (blood clots) coming from your vagina.  You have bleeding and need more than 1 pad an hour.  You feel like you are going to pass out (faint). This information is not intended to replace advice given to you by your health care provider. Make sure you discuss any questions you have with your health care provider. Document Released: 08/05/2008 Document Revised: 04/03/2016 Document Reviewed: 05/26/2013 Elsevier Interactive Patient Education  2017 [employer] NOW OFFER   [facility] FAST TRACK!!!  SAME DAY Appointments for ACUTE CARE  Such as: Sprains, Injuries, cuts, abrasions, rashes, muscle pain, joint pain, back pain Colds, flu, sore throats, headache, allergies, cough,  fever  Ear pain, sinus and eye infections Abdominal pain, nausea, vomiting, diarrhea, upset stomach Animal/insect bites  3 Easy Ways to Schedule: Walk-In Scheduling Call in scheduling Mychart Sign-up: [email or web address]

## 2017-01-15 NOTE — Progress Notes (Signed)
Subjective:    Patient ID: Sheila Armstrong, female    DOB: 11-23-1934, 82 y.o.   MRN: [id number]  HPI  Ms. Gotcher is a 82 year old female who presents with postmenopausal vaginal bleeding that started one week ago.    She reports a history of suspected vaginal bleeding in the fall 2017 with a UTI however this cleared with antibiotics. She was seen yesterday at [facility] where she was evaluated and advised to have a CT scan to evaluate source of blood in urine. She also had blood work however she is not aware of the test or results at this time.  Since her visit yesterday, she was instructed to try a tampon to evaluate if source of bleeding is from vagina. She states today that she has noted approximately 1 tsp of blood with tampon use. She did state that she also noted this same amount of blood on a pad. Bleeding has not worsened per patient. She uses premarin cream due to atrophy and notes that she has discomfort with intercourse. She denies that bleeding started after intercourse this episode but has noted discomfort previously. She has no history of abnormal Paps. She is a nonsmoker. She denies fever, chills, sweats, chest pain, palpitations, SOB, weakness, tingling, fatigue, numbness, HA, or nosebleeds. She denies bleeding from other sources. No additional treatments at home.   Review of Systems  Constitutional: Negative for chills, fatigue and fever.  Respiratory: Negative for cough, shortness of breath and wheezing.   Cardiovascular: Negative for chest pain and palpitations.  Gastrointestinal: Negative for abdominal pain, diarrhea, nausea and vomiting.  Genitourinary: Positive for vaginal bleeding. Negative for dysuria, hematuria, vaginal discharge and vaginal pain.  Musculoskeletal: Negative for myalgias.  Neurological: Negative for dizziness, weakness, light-headedness and headaches.  Hematological: Does not bruise/bleed easily.   Past Medical History:  Diagnosis Date  .  ALLERGIC RHINITIS 07/07/2007  . COLONIC POLYPS, ADENOMATOUS, HX OF 11/19/2005  . CONSTIPATION 12/31/2009  . DIVERTICULOSIS, COLON 07/12/2008  . Family history of colon cancer   . Hearing loss   . SEBORRHEIC KERATOSIS, INFLAMED 03/21/2008  . SENILE VAGINITIS 01/17/2008     Social History   Social History  . Marital status: Married    Spouse name: N/A  . Number of children: N/A  . Years of education: N/A   Occupational History  . Not on file.   Social History Main Topics  . Smoking status: Never Smoker  . Smokeless tobacco: Never Used  . Alcohol use No  . Drug use: No  . Sexual activity: Not on file   Other Topics Concern  . 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 DILATION AND CURETTAGE OF UTERUS    . EYELID IMPLANT     right and left  . RHINOPLASTY      Family History  Problem Relation Age of Onset  . Colon cancer Mother 41  . Anuerysm Sister     Allergies  Allergen Reactions  . Sulfonamide Derivatives     REACTION: hives    Current Outpatient Prescriptions on File Prior to Visit  Medication Sig Dispense Refill  . conjugated estrogens (PREMARIN) vaginal cream Small amounts twice weekly 45 g 11  . Probiotic  Product (ALIGN) 4 MG CAPS Take 1 tablet by mouth daily.     No current facility-administered medications on file prior to visit.     BP 118/84 (BP Location: Left Arm, Patient Position: Sitting, Cuff Size: Normal)   Pulse 92   Temp 98 F (36.7 C) (Oral)   Wt 153 lb 12.8 oz (69.8 kg)   SpO2 98%   BMI 22.88 kg/m        Objective:   Physical Exam  Constitutional: She is oriented to person, place, and time. She appears well-developed and well-nourished.  Eyes: Pupils are equal, round, and reactive to light. No scleral icterus.  Neck: Neck supple.  Cardiovascular: Normal  rate and regular rhythm.   Pulmonary/Chest: Effort normal and breath sounds normal. She has no wheezes. She has no rales.  Abdominal: Soft. Bowel sounds are normal. There is no tenderness.  Genitourinary:  Genitourinary Comments: Vaginal wall smooth,shiny, and pale with minimal bleeding from patchy erythema.  Patient reported discomfort with exam; unable to visualize cervix.  Lymphadenopathy:    She has no cervical adenopathy.  Neurological: She is alert and oriented to person, place, and time. Coordination normal.  Skin: Skin is warm and dry. No rash noted.       Assessment & Plan:  1. Postmenopausal vaginal bleeding Exam is reassuring and supportive of vaginal atrophy as source; history vaginal atrophy; use of premarin; report of pain with intercourse; suspect that source of bleeding is due to atrophy; will refer to gynecology for further evaluation due to age and postmenopausal status. Advised avoidance of tampons now as she has established source of bleeding, advised to change to pads and provided written instructions for follow up:  See AVS. - Ambulatory referral to Gynecology  She will also follow up with urology as recommended; She is scheduled for a CT scan to evaluate kidneys.  Delano Metz, FNP-C

## 2017-01-21 ENCOUNTER — Encounter: Payer: Self-pay | Admitting: Obstetrics and Gynecology

## 2017-01-21 ENCOUNTER — Ambulatory Visit: Payer: Medicare Other | Admitting: Obstetrics and Gynecology

## 2017-01-21 ENCOUNTER — Telehealth: Payer: Self-pay | Admitting: Adult Health

## 2017-01-21 VITALS — BP 140/90 | HR 100 | Resp 16 | Wt 155.0 lb

## 2017-01-21 DIAGNOSIS — N882 Stricture and stenosis of cervix uteri: Secondary | ICD-10-CM | POA: Diagnosis not present

## 2017-01-21 DIAGNOSIS — Z124 Encounter for screening for malignant neoplasm of cervix: Secondary | ICD-10-CM | POA: Diagnosis not present

## 2017-01-21 DIAGNOSIS — N950 Postmenopausal bleeding: Secondary | ICD-10-CM

## 2017-01-21 MED ORDER — MISOPROSTOL 200 MCG PO TABS: ORAL_TABLET | ORAL | 0 refills | Status: DC

## 2017-01-21 NOTE — Progress Notes (Addendum)
82 y.o. [id number] MarriedCaucasianF here for a consultation from Delano Metz, FNP-C for PMP bleeding. The patient c/o intermittent bleeding since last September.  Initially it was thought that the blood was from her bladder, recently figured out it was from her vagina by wearing a tampon. She has been bleeding off and on, this last episode of bleeding started a week ago. She can saturate a pad in 4 hours, has bleed through her clothes. No pain. She does use premarin cream 0.5 gm 2 x a week, recently not using it. No systemic estrogen. Sexually active, intermittent dyspareunia, helped with lubricant. Primary saw some irritation in her vagina. The bleeding didn't start after sexual activity.     No LMP recorded. Patient is postmenopausal.          Sexually active: No.  The current method of family planning is post menopausal status.    Exercising: Yes.    walking, exercise, PT Smoker:  no  Health Maintenance: Pap:  03-09-13 WNL  History of abnormal Pap:  no MMG:  06-30-16 WNL Colonoscopy:  07-22-13 WNL per patient BMD:   09-03-16 Low bone mass  TDaP:  02-13-11 Gardasil: N/A   reports that she has never smoked. She has never used smokeless tobacco. She reports that she does not drink alcohol or use drugs.  Past Medical History:  Diagnosis Date  . ALLERGIC RHINITIS 07/07/2007  . COLONIC POLYPS, ADENOMATOUS, HX OF 11/19/2005  . CONSTIPATION 12/31/2009  . DIVERTICULOSIS, COLON 07/12/2008  . Family history of colon cancer   . Hearing loss   . SEBORRHEIC KERATOSIS, INFLAMED 03/21/2008  . SENILE VAGINITIS 01/17/2008    Past Surgical History:  Procedure Laterality Date  . DILATION AND CURETTAGE OF UTERUS    . EYELID IMPLANT     right and left  . RHINOPLASTY      Current Outpatient Prescriptions  Medication Sig Dispense Refill  . cetirizine (ZYRTEC) 10 MG tablet Take 10 mg by mouth daily.    Marland Kitchen conjugated estrogens (PREMARIN) vaginal cream Small amounts twice weekly 45 g 11  . EPINEPHrine 0.3  mg/0.3 mL IJ SOAJ injection INJ UTD PRF SYSTEMIC REACTIONS  0  . fluticasone (FLONASE) 50 MCG/ACT nasal spray 2 sprays by Each Nare route daily for 30 days.    . Multiple Vitamin (MULTIVITAMIN) capsule Take 1 capsule by mouth daily.    . NON FORMULARY Allergy Injections- weekly    . Probiotic Product (ALIGN) 4 MG CAPS Take 1 tablet by mouth daily.     No current facility-administered medications for this visit.     Family History  Problem Relation Age of Onset  . Colon cancer Mother 53  . Anuerysm Sister     Review of Systems  Constitutional: Negative.   HENT: Negative.   Eyes: Negative.   Respiratory: Negative.   Cardiovascular: Negative.   Gastrointestinal: Negative.   Endocrine: Negative.   Genitourinary: Negative.        PMB  Musculoskeletal: Negative.   Skin: Negative.   Allergic/Immunologic: Negative.   Neurological: Negative.   Psychiatric/Behavioral: Negative.     Exam:   BP 140/90 (BP Location: Right Arm, Patient Position: Sitting, Cuff Size: Normal)   Pulse 100   Resp 16   Wt 155 lb (70.3 kg)   BMI 23.06 kg/m   Weight change: @WEIGHTCHANGE @ Height:      Ht Readings from Last 3 Encounters:  11/25/16 5' 8.75" (1.746 m)  09/29/16 5' 8.75" (1.746 m)  08/26/16 5' 8.75" (1.746 m)  General appearance: alert, cooperative and appears stated age Head: Normocephalic, without obvious abnormality, atraumatic Neck: no adenopathy, supple, symmetrical, trachea midline and thyroid normal to inspection and palpation Lungs: clear to auscultation bilaterally Cardiovascular: regular rate and rhythm Heart: tachycardic rate just over 100, regular rhythm (very nervous) Abdomen: soft, non-tender; bowel sounds normal; no masses,  no organomegaly Extremities: extremities normal, atraumatic, no cyanosis or edema Skin: Skin color, texture, turgor normal. No rashes or lesions Lymph nodes: Cervical, supraclavicular, and axillary nodes normal. No abnormal inguinal nodes  palpated Neurologic: Grossly normal   Pelvic: External genitalia:  no lesions              Urethra:  normal appearing urethra with no masses, no caruncle, no tenderness or lesions              Bartholins and Skenes: normal                 Vagina: atrophic appearing vagina, no lesions, no areas of erosion, no blood or discharge seen              Cervix: stenotic and adhesed posteriorly to the vaginal apex.                Bimanual Exam:  Uterus:  normal size, contour, position, consistency, mobility, non-tender              Adnexa: no mass, fullness, tenderness               Rectovaginal: Confirms               Anus:  normal sphincter tone, no lesions  Chaperone was present for exam.  A:  Postmenopausal bleeding  Stenotic cervix  P:   Pap with reflex hpv  Return for an ultrasound, possible sonohysterogram, possible endometrial biopsy (under ultrasound guidance)  Will pre-treat with cytotec  CC: Delano Metz, FNP-C  Addendum: requesting records from Urology

## 2017-01-21 NOTE — Telephone Encounter (Signed)
I spoke with patient.  I advised her that I had reviewed her chart - it looks like patient's last Pap Smear was in 2014 - but she has had pelvic exams since that period.  Patient was very adamant and stern with me, stating that I was wrong and that she knows she has had multiple Pap Smears since then. She states that she wants to speak with Dr. Sherren Mocha only because she knows he will be correct on this issue.  I advised patient that Dr. Sherren Mocha was out of the office this week, and patient verbalized understanding.

## 2017-01-21 NOTE — Telephone Encounter (Signed)
Pt has called again and she is aware that it could be tomorrow due to [facility] seeing patients.

## 2017-01-21 NOTE — Telephone Encounter (Signed)
Pt would like to know when her last pap was done she had goen to the gynecologist and they stated that her last one was done in 2014 and she state that was incorrect she has had several done with Dr. Sherren Mocha since that time frame.  And would appreciate a call back.

## 2017-01-21 NOTE — Patient Instructions (Signed)
Sonohysterogram A sonohysterogram is a procedure to examine the inside of the uterus. This exam uses sound waves that are sent to a computer to make images of the lining of the uterus (endometrium). To get the best images, a germ-free, salt-water solution (sterile saline) is put into the uterus through the vagina. You may have this procedure if you have certain reproductive problems, such as abnormal bleeding, infertility, or miscarriage. This procedure can show what may be causing these problems. Possible causes include scarring or abnormal growths such as fibroids inside your uterus. It can also show if your uterus is an abnormal shape or if the lining of the uterus is too thin. Tell a health care provider about:  All medicines you are taking, including vitamins, herbs, eye drops, creams, and over-the-counter medicines.  Any allergies you have.  Any blood disorders you have.  Any surgeries you have had.  Any medical conditions you have.  Whether you are pregnant or may be pregnant.  The date of the first day of your last period.  Any signs of infection, such as fever, pain in your lower abdomen, or abnormal discharge from your vagina. What are the risks? Generally, this is a safe procedure. However, problems may occur, including:  Abdominal pain or cramping.  Light bleeding (spotting).  Increased vaginal discharge.  Infection. What happens before the procedure?  Your health care provider may have you take an over-the-counter pain medicine.  You may be given medicine to stop any abnormal bleeding.  You may be given antibiotic medicine to help prevent infection.  You may be asked to take a pregnancy test. This is usually in the form of a urine test.  You may have a pelvic exam.  You will be asked to empty your bladder. What happens during the procedure?  You will lie down on the exam table with your feet in stirrups or with your knees bent and your feet flat on the  table.  A slender, handheld device (transducer) will be lubricated and placed into your vagina.  The transducer will be positioned to send sound waves to your uterus. The sound waves are sent to a computer and are turned into images, which your health care provider sees during the procedure.  The transducer will be removed from your vagina.  An instrument will be inserted to widen the opening of your vagina (speculum).  A swab with germ-killing solution (antiseptic) will be used to clean the opening to your uterus (cervix).  A long, thin tube (catheter) will be placed through your cervix into your uterus.  The speculum will be removed.  The transducer will be placed back into your vagina to take more images.  Your uterus will be filled with a germ-free, salt-water solution (sterile saline) through the catheter. You may feel some cramping.  A fluid that contains air bubbles may be sent through the catheter to make it easier to see the fallopian tubes.  The transducer and catheter will be removed. The procedure may vary among health care providers and hospitals. What happens after the procedure?  It is up to you to get the results of your procedure. Ask your health care provider, or the department that is doing the procedure, when your results will be ready. Summary  A sonohysterogram is a procedure that creates images of the inside of the uterus.  The risks of this procedure are very low. Most women experience cramping and spotting after the procedure.  You may need to have a   pelvic exam and take a pregnancy test before this procedure. This procedure will not be done if you are pregnant or have an infection. This information is not intended to replace advice given to you by your health care provider. Make sure you discuss any questions you have with your health care provider. Document Released: 03/13/2014 Document Revised: 09/22/2016 Document Reviewed: 09/22/2016 Elsevier  Interactive Patient Education  2017 Elsevier Inc.  

## 2017-01-22 ENCOUNTER — Other Ambulatory Visit: Payer: Self-pay | Admitting: Obstetrics and Gynecology

## 2017-01-22 ENCOUNTER — Encounter: Payer: Self-pay | Admitting: Obstetrics and Gynecology

## 2017-01-22 ENCOUNTER — Ambulatory Visit (INDEPENDENT_AMBULATORY_CARE_PROVIDER_SITE_OTHER): Payer: Medicare Other | Admitting: Obstetrics and Gynecology

## 2017-01-22 ENCOUNTER — Ambulatory Visit (INDEPENDENT_AMBULATORY_CARE_PROVIDER_SITE_OTHER): Payer: Medicare Other

## 2017-01-22 VITALS — BP 130/78 | HR 76 | Temp 97.9°F | Resp 16 | Wt 155.0 lb

## 2017-01-22 DIAGNOSIS — N882 Stricture and stenosis of cervix uteri: Secondary | ICD-10-CM | POA: Diagnosis not present

## 2017-01-22 DIAGNOSIS — N950 Postmenopausal bleeding: Secondary | ICD-10-CM | POA: Diagnosis not present

## 2017-01-22 DIAGNOSIS — N719 Inflammatory disease of uterus, unspecified: Secondary | ICD-10-CM | POA: Diagnosis not present

## 2017-01-22 NOTE — Telephone Encounter (Signed)
Left message for patient to return call to me at office to further discuss concerns.

## 2017-01-22 NOTE — Progress Notes (Signed)
GYNECOLOGY  VISIT   HPI: 82 y.o.   Married  Caucasian  female   [phone] with No LMP recorded. Patient is postmenopausal.   here for follow up PMB/Abdominal U/S. She has been having intermittent vaginal bleeding since September, 2017. She denies pain, no fevers.   GYNECOLOGIC HISTORY: No LMP recorded. Patient is postmenopausal. Contraception:postmenopause  Menopausal hormone therapy: none         OB History    Gravida Para Term Preterm AB Living   3 2 2   1 2    SAB TAB Ectopic Multiple Live Births   1       2         Patient Active Problem List   Diagnosis Date Noted  . Urinary leakage 12/04/2015  . Routine general medical examination at a health care facility 03/27/2015  . Viral URI with cough 11/14/2014  . Contact dermatitis 09/24/2011  . DIVERTICULOSIS, COLON 07/12/2008  . SEBORRHEIC KERATOSIS, INFLAMED 03/21/2008  . SENILE VAGINITIS 01/17/2008  . Allergic rhinitis 07/07/2007  . COLONIC POLYPS, ADENOMATOUS, HX OF 11/19/2005    Past Medical History:  Diagnosis Date  . Abnormal uterine bleeding   . ALLERGIC RHINITIS 07/07/2007  . COLONIC POLYPS, ADENOMATOUS, HX OF 11/19/2005  . CONSTIPATION 12/31/2009  . DIVERTICULOSIS, COLON 07/12/2008  . Family history of colon cancer   . Hearing loss   . SEBORRHEIC KERATOSIS, INFLAMED 03/21/2008  . SENILE VAGINITIS 01/17/2008    Past Surgical History:  Procedure Laterality Date  . COSMETIC SURGERY    . DILATION AND CURETTAGE OF UTERUS    . EYELID IMPLANT     right and left  . RHINOPLASTY      Current Outpatient Prescriptions  Medication Sig Dispense Refill  . cetirizine (ZYRTEC) 10 MG tablet Take 10 mg by mouth daily.    Marland Kitchen conjugated estrogens (PREMARIN) vaginal cream Small amounts twice weekly 45 g 11  . EPINEPHrine 0.3 mg/0.3 mL IJ SOAJ injection INJ UTD PRF SYSTEMIC REACTIONS  0  . fluticasone (FLONASE) 50 MCG/ACT nasal spray 2 sprays by Each Nare route daily for 30 days.    . misoprostol (CYTOTEC) 200 MCG tablet Place 2  tablets vaginally tonight 2 tablet 0  . Multiple Vitamin (MULTIVITAMIN) capsule Take 1 capsule by mouth daily.    . NON FORMULARY Allergy Injections- weekly    . Probiotic Product (ALIGN) 4 MG CAPS Take 1 tablet by mouth daily.     No current facility-administered medications for this visit.      ALLERGIES: Sulfonamide derivatives  Family History  Problem Relation Age of Onset  . Colon cancer Mother 89  . Heart disease Father   . Anuerysm Sister     Social History   Social History  . Marital status: Married    Spouse name: N/A  . Number of children: N/A  . Years of education: N/A   Occupational History  . Not on file.   Social History Main Topics  . Smoking status: Never Smoker  . Smokeless tobacco: Never Used  . Alcohol use No  . Drug use: No  . Sexual activity: No   Other Topics Concern  . Not on file   Social History Narrative   Married for 9 years    Two children ( both live locally)    Retired - [employer]       She likes to travel     Review of Systems  Constitutional: Negative.   HENT: Negative.  Eyes: Negative.   Respiratory: Negative.   Cardiovascular: Negative.   Gastrointestinal: Negative.   Genitourinary: Negative.   Musculoskeletal: Negative.   Skin: Negative.   Neurological: Negative.   Endo/Heme/Allergies: Negative.   Psychiatric/Behavioral: Negative.     PHYSICAL EXAMINATION:    BP 130/78 (BP Location: Right Arm, Patient Position: Sitting, Cuff Size: Normal)   Pulse 76   Resp 16   Wt 155 lb (70.3 kg)   BMI 23.06 kg/m     General appearance: alert, cooperative and appears stated age  Pelvic: External genitalia:  no lesions              Urethra:  normal appearing urethra with no masses, tenderness or lesions              Bartholins and Skenes: normal                 Vagina: normal appearing vagina with normal color and discharge, no lesions              Cervix: Stenotic  The risks of endometrial biopsy were  reviewed and a consent was obtained.  A speculum was placed in the vagina and the cervix was cleansed with hibacleans. A  tenaculum was placed on the cervix and the cervix was dilated with the mini-dilators. Thick brown/cream colored fluid started to fill the vagina. The uterine evacuator was placed into the endometrial cavity. The uterus sounded to 8 cm. The syringe instantly filled with a blood/pus appearing mixture (no odor). The cavity was emptied with several passes with the uterine evacuator. The cavity only partially felt gritty by the end of the procedure, unable to get the cavity to feel gritty throughout. The uterine evacuator was removed. The tenaculum and speculum were removed. There were no complications.   Chaperone was present for exam.  Ultrasound images reviewed with the patient  ASSESSMENT Postmenopausal bleeding Uterine cavity filled with blood and suspect sterile abscess    PLAN Endometrial curettings and gram stain/culture of uterine contents sent to the lab The patient is to call with fevers, chills, abdominal pain or any other concerns   An After Visit Summary was printed and given to the patient.  20 minutes face to face time of which over 50% was spent in counseling.

## 2017-01-23 LAB — IPS PAP TEST WITH REFLEX TO HPV

## 2017-01-27 ENCOUNTER — Telehealth: Payer: Self-pay | Admitting: Obstetrics and Gynecology

## 2017-01-27 ENCOUNTER — Ambulatory Visit (INDEPENDENT_AMBULATORY_CARE_PROVIDER_SITE_OTHER): Payer: Medicare Other | Admitting: Obstetrics and Gynecology

## 2017-01-27 ENCOUNTER — Telehealth: Payer: Self-pay

## 2017-01-27 ENCOUNTER — Telehealth: Payer: Self-pay | Admitting: *Deleted

## 2017-01-27 ENCOUNTER — Encounter: Payer: Self-pay | Admitting: Obstetrics and Gynecology

## 2017-01-27 VITALS — BP 132/80 | HR 100 | Resp 16 | Wt 155.0 lb

## 2017-01-27 DIAGNOSIS — C541 Malignant neoplasm of endometrium: Secondary | ICD-10-CM | POA: Diagnosis not present

## 2017-01-27 DIAGNOSIS — N950 Postmenopausal bleeding: Secondary | ICD-10-CM

## 2017-01-27 LAB — BODY FLUID CULTURE
Gram Stain: NONE SEEN
ORGANISM ID, BACTERIA: NO GROWTH

## 2017-01-27 NOTE — Progress Notes (Signed)
GYNECOLOGY  VISIT   HPI: 82 y.o.   Married  Caucasian  female   [phone] with No LMP recorded. Patient is postmenopausal.   here for EMB results. The patient underwent endometrial curettage last week in the office for PMP bleeding, she had an endometrial cavity filled with blood and a sterile abscess. Pathology returned as: Endometrium, curettage - ENDOMETRIOID ADENOCARCINOMA (FIGO GRADE 1) WITH BACKGROUND COMPLEX ATYPICAL HYPERPLASIA AND ABUNDANT NECROTIC TISSUE.  Culture of the fluid: Gram Stain Abundant   Gram Stain WBC present-both PMN and Mononuclear   Gram Stain No Organisms Seen   Organism ID, Bacteria NO GROWTH       GYNECOLOGIC HISTORY: No LMP recorded. Patient is postmenopausal. Contraception:postmenopause  Menopausal hormone therapy: none         OB History    Gravida Para Term Preterm AB Living   3 2 2   1 2    SAB TAB Ectopic Multiple Live Births   1       2         Patient Active Problem List   Diagnosis Date Noted  . Urinary leakage 12/04/2015  . Routine general medical examination at a health care facility 03/27/2015  . Viral URI with cough 11/14/2014  . Contact dermatitis 09/24/2011  . DIVERTICULOSIS, COLON 07/12/2008  . SEBORRHEIC KERATOSIS, INFLAMED 03/21/2008  . SENILE VAGINITIS 01/17/2008  . Allergic rhinitis 07/07/2007  . COLONIC POLYPS, ADENOMATOUS, HX OF 11/19/2005    Past Medical History:  Diagnosis Date  . Abnormal uterine bleeding   . ALLERGIC RHINITIS 07/07/2007  . COLONIC POLYPS, ADENOMATOUS, HX OF 11/19/2005  . CONSTIPATION 12/31/2009  . DIVERTICULOSIS, COLON 07/12/2008  . Family history of colon cancer   . Hearing loss   . SEBORRHEIC KERATOSIS, INFLAMED 03/21/2008  . SENILE VAGINITIS 01/17/2008    Past Surgical History:  Procedure Laterality Date  . COSMETIC SURGERY    . DILATION AND CURETTAGE OF UTERUS    . EYELID IMPLANT     right and left  . RHINOPLASTY      Current Outpatient Prescriptions  Medication Sig Dispense Refill   . cetirizine (ZYRTEC) 10 MG tablet Take 10 mg by mouth daily.    Marland Kitchen conjugated estrogens (PREMARIN) vaginal cream Small amounts twice weekly 45 g 11  . EPINEPHrine 0.3 mg/0.3 mL IJ SOAJ injection INJ UTD PRF SYSTEMIC REACTIONS  0  . fluticasone (FLONASE) 50 MCG/ACT nasal spray 2 sprays by Each Nare route daily for 30 days.    . Multiple Vitamin (MULTIVITAMIN) capsule Take 1 capsule by mouth daily.    . NON FORMULARY Allergy Injections- weekly    . Probiotic Product (ALIGN) 4 MG CAPS Take 1 tablet by mouth daily.     No current facility-administered medications for this visit.      ALLERGIES: Sulfonamide derivatives  Family History  Problem Relation Age of Onset  . Colon cancer Mother 7  . Heart disease Father   . Anuerysm Sister     Social History   Social History  . Marital status: Married    Spouse name: N/A  . Number of children: N/A  . Years of education: N/A   Occupational History  . Not on file.   Social History Main Topics  . Smoking status: Never Smoker  . Smokeless tobacco: Never Used  . Alcohol use No  . Drug use: No  . Sexual activity: No   Other Topics Concern  . Not on file   Social History Narrative  Married for 9 years    Two children ( both live locally)    Retired - [employer]       She likes to travel     Review of Systems  Constitutional: Negative.   HENT: Negative.   Eyes: Negative.   Respiratory: Negative.   Cardiovascular: Negative.   Gastrointestinal: Negative.   Genitourinary: Negative.   Musculoskeletal: Negative.   Skin: Negative.   Neurological: Negative.   Endo/Heme/Allergies: Negative.   Psychiatric/Behavioral: The patient is nervous/anxious.     PHYSICAL EXAMINATION:    BP 132/80 (BP Location: Right Arm, Patient Position: Sitting, Cuff Size: Normal)   Pulse 100   Resp 16   Wt 155 lb (70.3 kg)   BMI 23.06 kg/m     General appearance: alert, cooperative and appears stated  age  ASSESSMENT Postmenopausal bleeding Endometrioid Adenocarcinoma, FIGO grade 1    PLAN Reviewed diagnosis with the patient Referred to GYN Oncology Discussed surgery, staging and possible further treatment Will have her stop her premarin cream   An After Visit Summary was printed and given to the patient.  10 minutes face to face time of which over 50% was spent in counseling.   CC: Delano Metz, FNP-C

## 2017-01-27 NOTE — Telephone Encounter (Signed)
Spoke with patient and advised per Dr. Talbert Nan to stop taking the Premarin cream. Patient voiced understanding -eh

## 2017-01-27 NOTE — Telephone Encounter (Signed)
Spoke with patient. Consult appointment scheduled for today with Dr.Jertson at 2:45 pm. Patient is agreeable to date and time.  Call to [facility] patient is scheduled for an appointment with Dr.Rossi for first available GYN ONC appointment on 02/16/2017 at 9:45 am.  Routing to provider for final review. Patient agreeable to disposition. Will close encounter.

## 2017-01-27 NOTE — Telephone Encounter (Signed)
Call to [facility]. Left message for return call to office for scheduling new patient appointment.  Spoke with patient. Aware RN has contacted [place] and left a message for return call regarding scheduling. Advised will follow up tomorrow to get an earlier appointment for the patient at [facility]. Patient is agreeable.

## 2017-01-27 NOTE — Telephone Encounter (Signed)
-----   Message from Sheila Dom, MD sent at 01/26/2017  5:25 PM EDT ----- Please set this patient up to see GYN oncology. Pathology with endometrial cancer. Please set her up with me to review her pathology on 01/27/17. I don't want to give her this information over the phone. CC: Lamont Snowball (in case Verline Lema isn't here on Tuesday)

## 2017-01-27 NOTE — Telephone Encounter (Signed)
Left message to call Stillman Buenger at [phone]. 

## 2017-01-27 NOTE — Telephone Encounter (Signed)
Patient returned call and left a message to call her back at lunch.

## 2017-01-27 NOTE — Telephone Encounter (Signed)
Patient said that Dr.Jertson suggest that she make an appointment with a Doctor in [place] or [place]. Patient is not able to get an appointment in [facility] until April 9th. Patient is willing to see the "other Doctor" in [place] if she could be seen before April 9th.

## 2017-01-28 NOTE — Telephone Encounter (Signed)
Spoke with patient and advised Dr. Sherren Mocha is out of the office and would follow up with the provider once he returns and call patient back to discuss. Patient voiced understanding.

## 2017-01-28 NOTE — Telephone Encounter (Signed)
Spoke with [facility] Department of GYN Oncology. Appointment scheduled for April 2nd at 11:15 am with Dr.John Clarene Essex. This is first available. This will be at the [address] [place], [place] [address] location. OV notes, demographics, insurance card, and pathology results faxed to [facility] [phone] with cover sheet and confirmation. Call to [facility] laboratories. Spoke with Missy. Slides from EMB to be sent to Dr.John [facility].  Spoke with patient. Notified of appointment date and time as listed above. Patient is agreeable to date and time of appointment in [facility] with Dr.Soper. Address and information provided below to the patient.  [facility])  [place]  [address] [employer] [place], [place] [address]

## 2017-01-29 ENCOUNTER — Encounter: Payer: Self-pay | Admitting: Gynecology

## 2017-01-29 ENCOUNTER — Telehealth: Payer: Self-pay | Admitting: Gynecology

## 2017-01-29 NOTE — Telephone Encounter (Signed)
Routing to Dr.Jertson for review and advise. 

## 2017-01-29 NOTE — Telephone Encounter (Signed)
Spoke with Katrina at [facility] who states she spoke with the patient this morning as she had an earlier appointment open up. Patient is now scheduled to see Dr.Linda Thurston Pounds on 02/03/2017 and is agreeable to this date, time, and new provider.

## 2017-01-29 NOTE — Telephone Encounter (Signed)
Patient has an appointment with Dr.Soper April 2nd and is asking for Dr.Jertson's opinion of Dr.Soper before she goes to this appointment. Patient is wondering if Dr.Jertson thinks she should keep the appointment in [place] instead of seeing Dr.Soper?

## 2017-01-29 NOTE — Telephone Encounter (Signed)
Received a call from [place] at [facility] to schedule an appt for MS. Sheila Armstrong. Appt has been scheduled for the pt to see Dr. Fermin Schwab on 3/30 at 145pm. Gay Filler will notify the pt.

## 2017-02-06 ENCOUNTER — Ambulatory Visit: Payer: Medicare Other | Attending: Gynecology | Admitting: Gynecology

## 2017-02-06 ENCOUNTER — Encounter: Payer: Self-pay | Admitting: Gynecology

## 2017-02-06 VITALS — BP 137/72 | HR 102 | Temp 98.4°F | Resp 22 | Ht 68.0 in | Wt 153.8 lb

## 2017-02-06 DIAGNOSIS — C541 Malignant neoplasm of endometrium: Secondary | ICD-10-CM | POA: Insufficient documentation

## 2017-02-06 DIAGNOSIS — Z800 Family history of malignant neoplasm of digestive organs: Secondary | ICD-10-CM | POA: Diagnosis not present

## 2017-02-06 DIAGNOSIS — N952 Postmenopausal atrophic vaginitis: Secondary | ICD-10-CM | POA: Insufficient documentation

## 2017-02-06 DIAGNOSIS — Z9889 Other specified postprocedural states: Secondary | ICD-10-CM | POA: Diagnosis not present

## 2017-02-06 DIAGNOSIS — Z882 Allergy status to sulfonamides status: Secondary | ICD-10-CM | POA: Diagnosis not present

## 2017-02-06 DIAGNOSIS — Z8249 Family history of ischemic heart disease and other diseases of the circulatory system: Secondary | ICD-10-CM | POA: Diagnosis not present

## 2017-02-06 DIAGNOSIS — Z86010 Personal history of colonic polyps: Secondary | ICD-10-CM | POA: Insufficient documentation

## 2017-02-06 DIAGNOSIS — H9190 Unspecified hearing loss, unspecified ear: Secondary | ICD-10-CM | POA: Diagnosis not present

## 2017-02-06 NOTE — Progress Notes (Signed)
Consult Note: Gyn-Onc   Sheila Armstrong 82 y.o. female  No chief complaint on file.   Assessment : Grade 1 endometrial adenocarcinoma.  Plan: I discussed the natural history of endometrial cancer with the patient had her husband. The cornerstone of therapy is to perform a hysterectomy and bilateral salpingo-oophorectomy. They understand that based on final pathology additional therapy such as radiation therapy or chemotherapy may be necessary. I discussed the pros and cons of open versus robotic surgery and the patient wishes to proceed with robotic-assisted left scopic bilateral salpingo-oophorectomy hysterectomy and sentinel node biopsies. The risks of surgery including hemorrhage, infection, injury to adjacent viscera, and anesthetic risks were reviewed. All questions are answered.   HPI: The patient has had intermittent vaginal bleeding (postmenopausal) since the summer of 2017. This was recently evaluated with an endometrial biopsy revealing a grade 1 endometrial carcinoma. The patient has no other symptoms. She specifically denies any pelvic pain or pressure. Her only prior pelvic surgery is that of a tubal ligation.  She has no family history of gynecologic or breast cancers. She has no other significant health morbidities.  Review of Systems:10 point review of systems is negative except as noted in interval history.   Vitals: Blood pressure 137/72, pulse (!) 102, temperature 98.4 F (36.9 C), temperature source Oral, resp. rate (!) 22, height 5\' 8"  (1.727 m), weight 153 lb 12.8 oz (69.8 kg).  Physical Exam: General : The patient is a healthy woman in no acute distress.  HEENT: normocephalic, extraoccular movements normal; neck is supple without thyromegally  Lynphnodes: Supraclavicular and inguinal nodes not enlarged  Abdomen: Soft, non-tender, no ascites, no organomegally, no masses, no hernias  Pelvic:  EGBUS: Normal female  Vagina: Normal, no lesions  Urethra and Bladder:  Normal, non-tender  Cervix: Flush with the vaginal vault Uterus: Anterior normal shape size and consistency Bi-manual examination: Non-tender; no adenxal masses or nodularity  Rectal: normal sphincter tone, no masses, no blood  Lower extremities: No edema or varicosities. Normal range of motion      Allergies  Allergen Reactions  . Sulfonamide Derivatives     REACTION: hives    Past Medical History:  Diagnosis Date  . Abnormal uterine bleeding   . ALLERGIC RHINITIS 07/07/2007  . COLONIC POLYPS, ADENOMATOUS, HX OF 11/19/2005  . CONSTIPATION 12/31/2009  . DIVERTICULOSIS, COLON 07/12/2008  . Family history of colon cancer   . Hearing loss   . SEBORRHEIC KERATOSIS, INFLAMED 03/21/2008  . SENILE VAGINITIS 01/17/2008    Past Surgical History:  Procedure Laterality Date  . COSMETIC SURGERY    . DILATION AND CURETTAGE OF UTERUS    . EYELID IMPLANT     right and left  . RHINOPLASTY      Current Outpatient Prescriptions  Medication Sig Dispense Refill  . cetirizine (ZYRTEC) 10 MG tablet Take 10 mg by mouth daily.    Marland Kitchen conjugated estrogens (PREMARIN) vaginal cream Small amounts twice weekly 45 g 11  . EPINEPHrine 0.3 mg/0.3 mL IJ SOAJ injection INJ UTD PRF SYSTEMIC REACTIONS  0  . fluticasone (FLONASE) 50 MCG/ACT nasal spray 2 sprays by Each Nare route daily for 30 days.    . Multiple Vitamin (MULTIVITAMIN) capsule Take 1 capsule by mouth daily.    . NON FORMULARY Allergy Injections- weekly    . Probiotic Product (ALIGN) 4 MG CAPS Take 1 tablet by mouth daily.     No current facility-administered medications for this visit.     Social History  Social History  . Marital status: Married    Spouse name: N/A  . Number of children: N/A  . Years of education: N/A   Occupational History  . Not on file.   Social History Main Topics  . Smoking status: Never Smoker  . Smokeless tobacco: Never Used  . Alcohol use No  . Drug use: No  . Sexual activity: No   Other Topics Concern   . Not on file   Social History Narrative   Married for 9 years    Two children ( both live locally)    Retired - [employer]       She likes to travel     Family History  Problem Relation Age of Onset  . Colon cancer Mother 77  . Heart disease Father   . Anuerysm Sister       Marti Sleigh, MD 02/06/2017, 1:06 PM      Consult Note: Gyn-Onc   Sheila Armstrong 82 y.o. female  No chief complaint on file.   Assessment :  Plan:  Interval History:   HPI:  Review of Systems:10 point review of systems is negative except as noted in interval history.   Vitals: Blood pressure 137/72, pulse (!) 102, temperature 98.4 F (36.9 C), temperature source Oral, resp. rate (!) 22, height 5\' 8"  (1.727 m), weight 153 lb 12.8 oz (69.8 kg).  Physical Exam: General : The patient is a healthy woman in no acute distress.  HEENT: normocephalic, extraoccular movements normal; neck is supple without thyromegally  Lynphnodes: Supraclavicular and inguinal nodes not enlarged  Abdomen: Soft, non-tender, no ascites, no organomegally, no masses, no hernias  Pelvic:  EGBUS: Normal female  Vagina: Normal, no lesions  Urethra and Bladder: Normal, non-tender  Cervix: Surgically absent  Uterus: Surgically absent  Bi-manual examination: Non-tender; no adenxal masses or nodularity  Rectal: normal sphincter tone, no masses, no blood  Lower extremities: No edema or varicosities. Normal range of motion      Allergies  Allergen Reactions  . Sulfonamide Derivatives     REACTION: hives    Past Medical History:  Diagnosis Date  . Abnormal uterine bleeding   . ALLERGIC RHINITIS 07/07/2007  . COLONIC POLYPS, ADENOMATOUS, HX OF 11/19/2005  . CONSTIPATION 12/31/2009  . DIVERTICULOSIS, COLON 07/12/2008  . Family history of colon cancer   . Hearing loss   . SEBORRHEIC KERATOSIS, INFLAMED 03/21/2008  . SENILE VAGINITIS 01/17/2008    Past Surgical History:  Procedure  Laterality Date  . COSMETIC SURGERY    . DILATION AND CURETTAGE OF UTERUS    . EYELID IMPLANT     right and left  . RHINOPLASTY      Current Outpatient Prescriptions  Medication Sig Dispense Refill  . cetirizine (ZYRTEC) 10 MG tablet Take 10 mg by mouth daily.    Marland Kitchen conjugated estrogens (PREMARIN) vaginal cream Small amounts twice weekly 45 g 11  . EPINEPHrine 0.3 mg/0.3 mL IJ SOAJ injection INJ UTD PRF SYSTEMIC REACTIONS  0  . fluticasone (FLONASE) 50 MCG/ACT nasal spray 2 sprays by Each Nare route daily for 30 days.    . Multiple Vitamin (MULTIVITAMIN) capsule Take 1 capsule by mouth daily.    . NON FORMULARY Allergy Injections- weekly    . Probiotic Product (ALIGN) 4 MG CAPS Take 1 tablet by mouth daily.     No current facility-administered medications for this visit.     Social History   Social History  . Marital status:  Married    Spouse name: N/A  . Number of children: N/A  . Years of education: N/A   Occupational History  . Not on file.   Social History Main Topics  . Smoking status: Never Smoker  . Smokeless tobacco: Never Used  . Alcohol use No  . Drug use: No  . Sexual activity: No   Other Topics Concern  . Not on file   Social History Narrative   Married for 9 years    Two children ( both live locally)    Retired - [employer]       She likes to travel     Family History  Problem Relation Age of Onset  . Colon cancer Mother 37  . Heart disease Father   . Anuerysm Sister       Marti Sleigh, MD 02/06/2017, 1:07 PM

## 2017-02-06 NOTE — Patient Instructions (Signed)
Preparing for your Surgery  Plan for surgery on February 24, 2017 with Dr. Norberta Keens Planned procedure is Robotic hysterectomy with bilateral salphingo-oophorectomy an sentinel node biopsy. Our office will contact you with the surgical time once the booking is completed. Preoperative testing will call you 1 week before your surgical date to schedule an appointment.   Pre-operative Testing -You will receive a phone call from presurgical testing at [facility] to arrange for a pre-operative testing appointment before your surgery.  This appointment normally occurs one to two weeks before your scheduled surgery.   -Bring your insurance card, copy of an advanced directive if applicable, medication list  -At that visit, you will be asked to sign a consent for a possible blood transfusion in case a transfusion becomes necessary during surgery.  The need for a blood transfusion is rare but having consent is a necessary part of your care.     -You should not be taking blood thinners or aspirin at least ten days prior to surgery unless instructed by your surgeon.  Day Before Surgery at [facility] will be asked to take in a light diet the day before surgery.  Avoid carbonated beverages.  You will be advised to have nothing to eat or drink after midnight the evening before.     Eat a light diet the day before surgery.  Examples including soups, broths,  toast, yogurt, mashed potatoes.  Things to avoid include carbonated beverages  (fizzy beverages), raw fruits and raw vegetables, or beans.    If your bowels are filled with gas, your surgeon will have difficulty  visualizing your pelvic organs which increases your surgical risks.  Your role in recovery Your role is to become active as soon as directed by your doctor, while still giving yourself time to heal.  Rest when you feel tired. You will be asked to do the following in order to speed your recovery:  - Cough and breathe  deeply. This helps toclear and expand your lungs and can prevent pneumonia. You may be given a spirometer to practice deep breathing. A staff member will show you how to use the spirometer. - Do mild physical activity. Walking or moving your legs help your circulation and body functions return to normal. A staff member will help you when you try to walk and will provide you with simple exercises. Do not try to get up or walk alone the first time. - Actively manage your pain. Managing your pain lets you move in comfort. We will ask you to rate your pain on a scale of zero to 10. It is your responsibility to tell your doctor or nurse where and how much you hurt so your pain can be treated.  Special Considerations -If you are diabetic, you may be placed on insulin after surgery to have closer control over your blood sugars to promote healing and recovery.  This does not mean that you will be discharged on insulin.  If applicable, your oral antidiabetics will be resumed when you are tolerating a solid diet.  -Your final pathology results from surgery should be available by the Friday after surgery and the results will be relayed to you when available.  Eat a light diet the day before surgery.  Examples including soups, broths, toast, yogurt, mashed potatoes.  Things to avoid include carbonated beverages (fizzy beverages), raw fruits and raw vegetables, or beans.   If your bowels are filled with gas, your surgeon will have difficulty visualizing your  pelvic organs which increases your surgical risks. Blood Transfusion Information WHAT IS A BLOOD TRANSFUSION? A transfusion is the replacement of blood or some of its parts. Blood is made up of multiple cells which provide different functions.  Red blood cells carry oxygen and are used for blood loss replacement.  White blood cells fight against infection.  Platelets control bleeding.  Plasma helps clot blood.  Other blood products are available for  specialized needs, such as hemophilia or other clotting disorders. BEFORE THE TRANSFUSION  Who gives blood for transfusions?   You may be able to donate blood to be used at a later date on yourself (autologous donation).  Relatives can be asked to donate blood. This is generally not any safer than if you have received blood from a stranger. The same precautions are taken to ensure safety when a relative's blood is donated.  Healthy volunteers who are fully evaluated to make sure their blood is safe. This is blood bank blood. Transfusion therapy is the safest it has ever been in the practice of medicine. Before blood is taken from a donor, a complete history is taken to make sure that person has no history of diseases nor engages in risky social behavior (examples are intravenous drug use or sexual activity with multiple partners). The donor's travel history is screened to minimize risk of transmitting infections, such as malaria. The donated blood is tested for signs of infectious diseases, such as HIV and hepatitis. The blood is then tested to be sure it is compatible with you in order to minimize the chance of a transfusion reaction. If you or a relative donates blood, this is often done in anticipation of surgery and is not appropriate for emergency situations. It takes many days to process the donated blood. RISKS AND COMPLICATIONS Although transfusion therapy is very safe and saves many lives, the main dangers of transfusion include:   Getting an infectious disease.  Developing a transfusion reaction. This is an allergic reaction to something in the blood you were given. Every precaution is taken to prevent this. The decision to have a blood transfusion has been considered carefully by your caregiver before blood is given. Blood is not given unless the benefits outweigh the risks.

## 2017-02-10 ENCOUNTER — Telehealth: Payer: Self-pay

## 2017-02-10 NOTE — Telephone Encounter (Signed)
Pt notified of surgical date and time for 02/24/17 at 7:30am and her pre admit testing appt is 02/20/17

## 2017-02-16 ENCOUNTER — Ambulatory Visit: Payer: Medicare Other | Admitting: Gynecologic Oncology

## 2017-02-18 NOTE — Patient Instructions (Addendum)
Sheila Armstrong  02/18/2017   Your procedure is scheduled on: 02-24-17  Report to [facility] Main  Entrance Take [place]  elevators to 3rd floor to  [place] at 530AM.   Call this number if you have problems the morning of surgery [phone]     Remember: ONLY 1 PERSON MAY GO WITH YOU TO SHORT STAY TO GET  READY MORNING OF YOUR SURGERY.  Do not eat food or drink liquids :After Midnight.     Take these medicines the morning of surgery with A SIP OF WATER: cetirizine(zyrtec) as needed, nasal spray as needed                                You may not have any metal on your body including hair pins and              piercings  Do not wear jewelry, make-up, lotions, powders or perfumes, deodorant             Do not wear nail polish.  Do not shave  48 hours prior to surgery.     Do not bring valuables to the hospital. [place].  Contacts, dentures or bridgework may not be worn into surgery.  Leave suitcase in the car. After surgery it may be brought to your room.               Please read over the following fact sheets you were given: _____________________________________________________________________             Eat a light diet the day before surgery.  Examples including soups, broths, toast, yogurt, mashed potatoes.  Things to avoid include carbonated beverages (fizzy beverages), raw fruits and raw vegetables, or beans.   If your bowels are filled with gas, your surgeon will have difficulty visualizing your pelvic organs which increases your surgical risks.    [place] - Preparing for Surgery Before surgery, you can play an important role.  Because skin is not sterile, your skin needs to be as free of germs as possible.  You can reduce the number of germs on your skin by washing with CHG (chlorahexidine gluconate) soap before surgery.  CHG is an antiseptic cleaner which kills germs and bonds  with the skin to continue killing germs even after washing. Please DO NOT use if you have an allergy to CHG or antibacterial soaps.  If your skin becomes reddened/irritated stop using the CHG and inform your nurse when you arrive at Short Stay. Do not shave (including legs and underarms) for at least 48 hours prior to the first CHG shower.  You may shave your face/neck. Please follow these instructions carefully:  1.  Shower with CHG Soap the night before surgery and the  morning of Surgery.  2.  If you choose to wash your hair, wash your hair first as usual with your  normal  shampoo.  3.  After you shampoo, rinse your hair and body thoroughly to remove the  shampoo.                           4.  Use CHG as you would any other liquid soap.  You can apply chg directly  to the skin and wash                       Gently with a scrungie or clean washcloth.  5.  Apply the CHG Soap to your body ONLY FROM THE NECK DOWN.   Do not use on face/ open                           Wound or open sores. Avoid contact with eyes, ears mouth and genitals (private parts).                       Wash face,  Genitals (private parts) with your normal soap.             6.  Wash thoroughly, paying special attention to the area where your surgery  will be performed.  7.  Thoroughly rinse your body with warm water from the neck down.  8.  DO NOT shower/wash with your normal soap after using and rinsing off  the CHG Soap.                9.  Pat yourself dry with a clean towel.            10.  Wear clean pajamas.            11.  Place clean sheets on your bed the night of your first shower and do not  sleep with pets. Day of Surgery : Do not apply any lotions/deodorants the morning of surgery.  Please wear clean clothes to the hospital/surgery center.  FAILURE TO FOLLOW THESE INSTRUCTIONS MAY RESULT IN THE CANCELLATION OF YOUR SURGERY PATIENT SIGNATURE_________________________________  NURSE  SIGNATURE__________________________________  ________________________________________________________________________   Adam Phenix  An incentive spirometer is a tool that can help keep your lungs clear and active. This tool measures how well you are filling your lungs with each breath. Taking long deep breaths may help reverse or decrease the chance of developing breathing (pulmonary) problems (especially infection) following:  A long period of time when you are unable to move or be active. BEFORE THE PROCEDURE   If the spirometer includes an indicator to show your best effort, your nurse or respiratory therapist will set it to a desired goal.  If possible, sit up straight or lean slightly forward. Try not to slouch.  Hold the incentive spirometer in an upright position. INSTRUCTIONS FOR USE  1. Sit on the edge of your bed if possible, or sit up as far as you can in bed or on a chair. 2. Hold the incentive spirometer in an upright position. 3. Breathe out normally. 4. Place the mouthpiece in your mouth and seal your lips tightly around it. 5. Breathe in slowly and as deeply as possible, raising the piston or the ball toward the top of the column. 6. Hold your breath for 3-5 seconds or for as long as possible. Allow the piston or ball to fall to the bottom of the column. 7. Remove the mouthpiece from your mouth and breathe out normally. 8. Rest for a few seconds and repeat Steps 1 through 7 at least 10 times every 1-2 hours when you are awake. Take your time and take a few normal breaths between deep breaths. 9. The spirometer may include an indicator to show your best effort. Use the indicator as a goal to work toward  during each repetition. 10. After each set of 10 deep breaths, practice coughing to be sure your lungs are clear. If you have an incision (the cut made at the time of surgery), support your incision when coughing by placing a pillow or rolled up towels firmly  against it. Once you are able to get out of bed, walk around indoors and cough well. You may stop using the incentive spirometer when instructed by your caregiver.  RISKS AND COMPLICATIONS  Take your time so you do not get dizzy or light-headed.  If you are in pain, you may need to take or ask for pain medication before doing incentive spirometry. It is harder to take a deep breath if you are having pain. AFTER USE  Rest and breathe slowly and easily.  It can be helpful to keep track of a log of your progress. Your caregiver can provide you with a simple table to help with this. If you are using the spirometer at home, follow these instructions: [place] IF:   You are having difficultly using the spirometer.  You have trouble using the spirometer as often as instructed.  Your pain medication is not giving enough relief while using the spirometer.  You develop fever of 100.5 F (38.1 C) or higher. SEEK IMMEDIATE MEDICAL CARE IF:   You cough up bloody sputum that had not been present before.  You develop fever of 102 F (38.9 C) or greater.  You develop worsening pain at or near the incision site. MAKE SURE YOU:   Understand these instructions.  Will watch your condition.  Will get help right away if you are not doing well or get worse. Document Released: 03/09/2007 Document Revised: 01/19/2012 Document Reviewed: 05/10/2007 ExitCare Patient Information 2014 ExitCare, [place].   ________________________________________________________________________  WHAT IS A BLOOD TRANSFUSION? Blood Transfusion Information  A transfusion is the replacement of blood or some of its parts. Blood is made up of multiple cells which provide different functions.  Red blood cells carry oxygen and are used for blood loss replacement.  White blood cells fight against infection.  Platelets control bleeding.  Plasma helps clot blood.  Other blood products are available for  specialized needs, such as hemophilia or other clotting disorders. BEFORE THE TRANSFUSION  Who gives blood for transfusions?   Healthy volunteers who are fully evaluated to make sure their blood is safe. This is blood bank blood. Transfusion therapy is the safest it has ever been in the practice of medicine. Before blood is taken from a donor, a complete history is taken to make sure that person has no history of diseases nor engages in risky social behavior (examples are intravenous drug use or sexual activity with multiple partners). The donor's travel history is screened to minimize risk of transmitting infections, such as malaria. The donated blood is tested for signs of infectious diseases, such as HIV and hepatitis. The blood is then tested to be sure it is compatible with you in order to minimize the chance of a transfusion reaction. If you or a relative donates blood, this is often done in anticipation of surgery and is not appropriate for emergency situations. It takes many days to process the donated blood. RISKS AND COMPLICATIONS Although transfusion therapy is very safe and saves many lives, the main dangers of transfusion include:   Getting an infectious disease.  Developing a transfusion reaction. This is an allergic reaction to something in the blood you were given. Every precaution is taken to  prevent this. The decision to have a blood transfusion has been considered carefully by your caregiver before blood is given. Blood is not given unless the benefits outweigh the risks. AFTER THE TRANSFUSION  Right after receiving a blood transfusion, you will usually feel much better and more energetic. This is especially true if your red blood cells have gotten low (anemic). The transfusion raises the level of the red blood cells which carry oxygen, and this usually causes an energy increase.  The nurse administering the transfusion will monitor you carefully for complications. HOME CARE  INSTRUCTIONS  No special instructions are needed after a transfusion. You may find your energy is better. Speak with your caregiver about any limitations on activity for underlying diseases you may have. SEEK MEDICAL CARE IF:   Your condition is not improving after your transfusion.  You develop redness or irritation at the intravenous (IV) site. SEEK IMMEDIATE MEDICAL CARE IF:  Any of the following symptoms occur over the next 12 hours:  Shaking chills.  You have a temperature by mouth above 102 F (38.9 C), not controlled by medicine.  Chest, back, or muscle pain.  People around you feel you are not acting correctly or are confused.  Shortness of breath or difficulty breathing.  Dizziness and fainting.  You get a rash or develop hives.  You have a decrease in urine output.  Your urine turns a dark color or changes to pink, red, or brown. Any of the following symptoms occur over the next 10 days:  You have a temperature by mouth above 102 F (38.9 C), not controlled by medicine.  Shortness of breath.  Weakness after normal activity.  The white part of the eye turns yellow (jaundice).  You have a decrease in the amount of urine or are urinating less often.  Your urine turns a dark color or changes to pink, red, or brown. Document Released: 10/24/2000 Document Revised: 01/19/2012 Document Reviewed: 06/12/2008 [facility] Patient Information 2014 [place], [place].  _______________________________________________________________________

## 2017-02-19 NOTE — Progress Notes (Unsigned)
Error

## 2017-02-20 ENCOUNTER — Encounter (INDEPENDENT_AMBULATORY_CARE_PROVIDER_SITE_OTHER): Payer: Self-pay

## 2017-02-20 ENCOUNTER — Encounter (HOSPITAL_COMMUNITY)
Admission: RE | Admit: 2017-02-20 | Discharge: 2017-02-20 | Disposition: A | Discharge status: Home / Self Care | Payer: Medicare Other | Source: Ambulatory Visit | Attending: Obstetrics & Gynecology | Admitting: Obstetrics & Gynecology

## 2017-02-20 ENCOUNTER — Encounter (HOSPITAL_COMMUNITY): Payer: Self-pay

## 2017-02-20 DIAGNOSIS — Z01818 Encounter for other preprocedural examination: Secondary | ICD-10-CM | POA: Insufficient documentation

## 2017-02-20 DIAGNOSIS — C541 Malignant neoplasm of endometrium: Secondary | ICD-10-CM | POA: Diagnosis not present

## 2017-02-20 LAB — CBC WITH DIFFERENTIAL/PLATELET
BASOS ABS: 0 10*3/uL (ref 0.0–0.1)
Basophils Relative: 1 %
EOS PCT: 3 %
Eosinophils Absolute: 0.2 10*3/uL (ref 0.0–0.7)
HEMATOCRIT: 34.3 % — AB (ref 36.0–46.0)
HEMOGLOBIN: 11.3 g/dL — AB (ref 12.0–15.0)
LYMPHS PCT: 22 %
Lymphs Abs: 1.4 10*3/uL (ref 0.7–4.0)
MCH: 30.5 pg (ref 26.0–34.0)
MCHC: 32.9 g/dL (ref 30.0–36.0)
MCV: 92.5 fL (ref 78.0–100.0)
Monocytes Absolute: 0.6 10*3/uL (ref 0.1–1.0)
Monocytes Relative: 9 %
NEUTROS ABS: 4.2 10*3/uL (ref 1.7–7.7)
Neutrophils Relative %: 65 %
PLATELETS: 220 10*3/uL (ref 150–400)
RBC: 3.71 MIL/uL — AB (ref 3.87–5.11)
RDW: 13.9 % (ref 11.5–15.5)
WBC: 6.5 10*3/uL (ref 4.0–10.5)

## 2017-02-20 LAB — URINALYSIS, ROUTINE W REFLEX MICROSCOPIC
BILIRUBIN URINE: NEGATIVE
Glucose, UA: NEGATIVE mg/dL
Hgb urine dipstick: NEGATIVE
Ketones, ur: NEGATIVE mg/dL
NITRITE: NEGATIVE
PH: 5 (ref 5.0–8.0)
Protein, ur: NEGATIVE mg/dL
SPECIFIC GRAVITY, URINE: 1.01 (ref 1.005–1.030)
Squamous Epithelial / LPF: NONE SEEN

## 2017-02-20 LAB — COMPREHENSIVE METABOLIC PANEL
ALK PHOS: 48 U/L (ref 38–126)
ALT: 16 U/L (ref 14–54)
AST: 24 U/L (ref 15–41)
Albumin: 3.8 g/dL (ref 3.5–5.0)
Anion gap: 6 (ref 5–15)
BILIRUBIN TOTAL: 0.7 mg/dL (ref 0.3–1.2)
BUN: 19 mg/dL (ref 6–20)
CO2: 26 mmol/L (ref 22–32)
CREATININE: 0.85 mg/dL (ref 0.44–1.00)
Calcium: 9.2 mg/dL (ref 8.9–10.3)
Chloride: 106 mmol/L (ref 101–111)
Glucose, Bld: 99 mg/dL (ref 65–99)
Potassium: 4.4 mmol/L (ref 3.5–5.1)
Sodium: 138 mmol/L (ref 135–145)
TOTAL PROTEIN: 7.4 g/dL (ref 6.5–8.1)

## 2017-02-20 LAB — ABO/RH: ABO/RH(D): B POS

## 2017-02-23 NOTE — Telephone Encounter (Signed)
Will sign encounter 

## 2017-02-23 NOTE — Telephone Encounter (Signed)
Okay to close encounter?  °

## 2017-02-24 ENCOUNTER — Ambulatory Visit: Payer: Medicare Other | Admitting: General Surgery

## 2017-02-24 ENCOUNTER — Encounter (HOSPITAL_COMMUNITY)
Admission: RE | Disposition: A | Discharge status: Home / Self Care | Payer: Self-pay | Source: Ambulatory Visit | Attending: Obstetrics & Gynecology

## 2017-02-24 ENCOUNTER — Ambulatory Visit (HOSPITAL_COMMUNITY)
Admission: RE | Admit: 2017-02-24 | Discharge: 2017-02-25 | Disposition: A | Discharge status: Home / Self Care | Payer: Medicare Other | Source: Ambulatory Visit | Attending: Obstetrics & Gynecology | Admitting: Obstetrics & Gynecology

## 2017-02-24 ENCOUNTER — Ambulatory Visit (HOSPITAL_COMMUNITY): Payer: Medicare Other | Admitting: Anesthesiology

## 2017-02-24 ENCOUNTER — Encounter (HOSPITAL_COMMUNITY): Payer: Self-pay

## 2017-02-24 DIAGNOSIS — Z86010 Personal history of colonic polyps: Secondary | ICD-10-CM | POA: Diagnosis not present

## 2017-02-24 DIAGNOSIS — H9190 Unspecified hearing loss, unspecified ear: Secondary | ICD-10-CM | POA: Diagnosis not present

## 2017-02-24 DIAGNOSIS — Z882 Allergy status to sulfonamides status: Secondary | ICD-10-CM | POA: Diagnosis not present

## 2017-02-24 DIAGNOSIS — C541 Malignant neoplasm of endometrium: Secondary | ICD-10-CM | POA: Diagnosis not present

## 2017-02-24 DIAGNOSIS — N950 Postmenopausal bleeding: Secondary | ICD-10-CM | POA: Insufficient documentation

## 2017-02-24 DIAGNOSIS — K5900 Constipation, unspecified: Secondary | ICD-10-CM | POA: Diagnosis not present

## 2017-02-24 DIAGNOSIS — Z8249 Family history of ischemic heart disease and other diseases of the circulatory system: Secondary | ICD-10-CM | POA: Insufficient documentation

## 2017-02-24 DIAGNOSIS — Z800 Family history of malignant neoplasm of digestive organs: Secondary | ICD-10-CM | POA: Insufficient documentation

## 2017-02-24 DIAGNOSIS — Z79899 Other long term (current) drug therapy: Secondary | ICD-10-CM | POA: Insufficient documentation

## 2017-02-24 DIAGNOSIS — K5790 Diverticulosis of intestine, part unspecified, without perforation or abscess without bleeding: Secondary | ICD-10-CM | POA: Diagnosis not present

## 2017-02-24 HISTORY — PX: ROBOTIC ASSISTED TOTAL HYSTERECTOMY WITH BILATERAL SALPINGO OOPHERECTOMY: SHX6086

## 2017-02-24 HISTORY — PX: SENTINEL NODE BIOPSY: SHX6608

## 2017-02-24 LAB — TYPE AND SCREEN
ABO/RH(D): B POS
ANTIBODY SCREEN: NEGATIVE

## 2017-02-24 SURGERY — HYSTERECTOMY, TOTAL, ROBOT-ASSISTED, LAPAROSCOPIC, WITH BILATERAL SALPINGO-OOPHORECTOMY
Anesthesia: General

## 2017-02-24 MED ORDER — LACTATED RINGERS IV SOLN: INTRAVENOUS | Status: DC

## 2017-02-24 MED ORDER — SUGAMMADEX SODIUM 200 MG/2ML IV SOLN
INTRAVENOUS | Status: AC
  Filled 2017-02-24: qty 2

## 2017-02-24 MED ORDER — OXYCODONE HCL 5 MG PO TABS
5.0000 mg | ORAL_TABLET | Freq: Once | ORAL | Status: DC | PRN
Start: 2017-02-24 — End: 2017-02-24

## 2017-02-24 MED ORDER — IBUPROFEN 800 MG PO TABS
800.0000 mg | ORAL_TABLET | Freq: Three times a day (TID) | ORAL | Status: DC | PRN
  Filled 2017-02-24: qty 1

## 2017-02-24 MED ORDER — LACTATED RINGERS IR SOLN
Status: DC | PRN
  Administered 2017-02-24: 1000 mL

## 2017-02-24 MED ORDER — LACTATED RINGERS IV SOLN
INTRAVENOUS | Status: DC | PRN
  Administered 2017-02-24 (×2): via INTRAVENOUS

## 2017-02-24 MED ORDER — FENTANYL CITRATE (PF) 100 MCG/2ML IJ SOLN
INTRAMUSCULAR | Status: AC
  Administered 2017-02-24: 50 ug via INTRAVENOUS
  Filled 2017-02-24: qty 2

## 2017-02-24 MED ORDER — ONDANSETRON HCL 4 MG PO TABS: 4.0000 mg | ORAL_TABLET | Freq: Four times a day (QID) | ORAL | Status: DC | PRN

## 2017-02-24 MED ORDER — PROPOFOL 10 MG/ML IV BOLUS
INTRAVENOUS | Status: DC | PRN
  Administered 2017-02-24: 120 mg via INTRAVENOUS

## 2017-02-24 MED ORDER — LIDOCAINE 2% (20 MG/ML) 5 ML SYRINGE
INTRAMUSCULAR | Status: DC | PRN
  Administered 2017-02-24: 60 mg via INTRAVENOUS

## 2017-02-24 MED ORDER — FENTANYL CITRATE (PF) 250 MCG/5ML IJ SOLN
INTRAMUSCULAR | Status: AC
  Filled 2017-02-24: qty 5

## 2017-02-24 MED ORDER — LIDOCAINE 2% (20 MG/ML) 5 ML SYRINGE
INTRAMUSCULAR | Status: AC
  Filled 2017-02-24: qty 5

## 2017-02-24 MED ORDER — PHENYLEPHRINE HCL 10 MG/ML IJ SOLN
INTRAMUSCULAR | Status: DC | PRN
  Administered 2017-02-24 (×3): 80 ug via INTRAVENOUS
  Administered 2017-02-24: 40 ug via INTRAVENOUS

## 2017-02-24 MED ORDER — CEFAZOLIN SODIUM-DEXTROSE 2-4 GM/100ML-% IV SOLN
2.0000 g | INTRAVENOUS | Status: AC
  Administered 2017-02-24: 2 g via INTRAVENOUS

## 2017-02-24 MED ORDER — ALBUMIN HUMAN 5 % IV SOLN
INTRAVENOUS | Status: DC | PRN
  Administered 2017-02-24: 08:00:00 via INTRAVENOUS

## 2017-02-24 MED ORDER — STERILE WATER FOR IRRIGATION IR SOLN
Status: DC | PRN
Start: 2017-02-24 — End: 2017-02-24
  Administered 2017-02-24: 1000 mL

## 2017-02-24 MED ORDER — PROPOFOL 10 MG/ML IV BOLUS
INTRAVENOUS | Status: AC
  Filled 2017-02-24: qty 20

## 2017-02-24 MED ORDER — ONDANSETRON HCL 4 MG/2ML IJ SOLN
INTRAMUSCULAR | Status: DC | PRN
  Administered 2017-02-24: 4 mg via INTRAVENOUS

## 2017-02-24 MED ORDER — HYDROMORPHONE HCL 2 MG/ML IJ SOLN
0.2000 mg | INTRAMUSCULAR | Status: DC | PRN
  Administered 2017-02-24: 0.2 mg via INTRAVENOUS
  Filled 2017-02-24: qty 1

## 2017-02-24 MED ORDER — SUCCINYLCHOLINE CHLORIDE 200 MG/10ML IV SOSY
PREFILLED_SYRINGE | INTRAVENOUS | Status: AC
  Filled 2017-02-24: qty 10

## 2017-02-24 MED ORDER — LIP MEDEX EX OINT
TOPICAL_OINTMENT | CUTANEOUS | Status: AC
  Filled 2017-02-24: qty 7

## 2017-02-24 MED ORDER — ONDANSETRON HCL 4 MG/2ML IJ SOLN: 4.0000 mg | Freq: Four times a day (QID) | INTRAMUSCULAR | Status: DC | PRN

## 2017-02-24 MED ORDER — SUCCINYLCHOLINE CHLORIDE 200 MG/10ML IV SOSY
PREFILLED_SYRINGE | INTRAVENOUS | Status: DC | PRN
  Administered 2017-02-24: 100 mg via INTRAVENOUS

## 2017-02-24 MED ORDER — FENTANYL CITRATE (PF) 100 MCG/2ML IJ SOLN
25.0000 ug | INTRAMUSCULAR | Status: DC | PRN
  Administered 2017-02-24 (×3): 50 ug via INTRAVENOUS

## 2017-02-24 MED ORDER — SUGAMMADEX SODIUM 200 MG/2ML IV SOLN
INTRAVENOUS | Status: DC | PRN
  Administered 2017-02-24: 200 mg via INTRAVENOUS

## 2017-02-24 MED ORDER — MIDAZOLAM HCL 2 MG/2ML IJ SOLN
INTRAMUSCULAR | Status: AC
  Filled 2017-02-24: qty 2

## 2017-02-24 MED ORDER — GABAPENTIN 300 MG PO CAPS
300.0000 mg | ORAL_CAPSULE | Freq: Every day | ORAL | Status: AC
  Administered 2017-02-24: 300 mg via ORAL
  Filled 2017-02-24: qty 1

## 2017-02-24 MED ORDER — TRAMADOL HCL 50 MG PO TABS: 100.0000 mg | ORAL_TABLET | Freq: Two times a day (BID) | ORAL | Status: DC | PRN

## 2017-02-24 MED ORDER — ENOXAPARIN SODIUM 40 MG/0.4ML ~~LOC~~ SOLN
40.0000 mg | SUBCUTANEOUS | Status: DC
  Administered 2017-02-25: 40 mg via SUBCUTANEOUS
  Filled 2017-02-24: qty 0.4

## 2017-02-24 MED ORDER — KCL IN DEXTROSE-NACL 20-5-0.45 MEQ/L-%-% IV SOLN
INTRAVENOUS | Status: AC
  Administered 2017-02-24: 1000 mL via INTRAVENOUS
  Filled 2017-02-24: qty 1000

## 2017-02-24 MED ORDER — ONDANSETRON HCL 4 MG/2ML IJ SOLN
INTRAMUSCULAR | Status: AC
  Filled 2017-02-24: qty 2

## 2017-02-24 MED ORDER — ROCURONIUM BROMIDE 50 MG/5ML IV SOSY
PREFILLED_SYRINGE | INTRAVENOUS | Status: DC | PRN
  Administered 2017-02-24: 40 mg via INTRAVENOUS

## 2017-02-24 MED ORDER — OXYCODONE HCL 5 MG/5ML PO SOLN
5.0000 mg | Freq: Once | ORAL | Status: DC | PRN
  Filled 2017-02-24: qty 5

## 2017-02-24 MED ORDER — STERILE WATER FOR INJECTION IJ SOLN
INTRAMUSCULAR | Status: AC
  Filled 2017-02-24: qty 10

## 2017-02-24 MED ORDER — OXYCODONE-ACETAMINOPHEN 5-325 MG PO TABS
1.0000 | ORAL_TABLET | ORAL | Status: DC | PRN
  Administered 2017-02-25: 1 via ORAL
  Filled 2017-02-24 (×2): qty 1

## 2017-02-24 MED ORDER — FENTANYL CITRATE (PF) 250 MCG/5ML IJ SOLN
INTRAMUSCULAR | Status: DC | PRN
Start: 2017-02-24 — End: 2017-02-24
  Administered 2017-02-24 (×3): 50 ug via INTRAVENOUS

## 2017-02-24 MED ORDER — PHENYLEPHRINE 40 MCG/ML (10ML) SYRINGE FOR IV PUSH (FOR BLOOD PRESSURE SUPPORT)
PREFILLED_SYRINGE | INTRAVENOUS | Status: AC
  Filled 2017-02-24: qty 10

## 2017-02-24 MED ORDER — DEXAMETHASONE SODIUM PHOSPHATE 10 MG/ML IJ SOLN
INTRAMUSCULAR | Status: DC | PRN
  Administered 2017-02-24: 10 mg via INTRAVENOUS

## 2017-02-24 MED ORDER — DEXAMETHASONE SODIUM PHOSPHATE 10 MG/ML IJ SOLN
INTRAMUSCULAR | Status: AC
  Filled 2017-02-24: qty 1

## 2017-02-24 MED ORDER — ENOXAPARIN SODIUM 40 MG/0.4ML ~~LOC~~ SOLN
40.0000 mg | SUBCUTANEOUS | Status: AC
  Administered 2017-02-24: 40 mg via SUBCUTANEOUS
  Filled 2017-02-24: qty 0.4

## 2017-02-24 MED ORDER — KCL IN DEXTROSE-NACL 20-5-0.45 MEQ/L-%-% IV SOLN
INTRAVENOUS | Status: DC
  Administered 2017-02-24: 1000 mL via INTRAVENOUS
  Administered 2017-02-25: 50 mL/h via INTRAVENOUS
  Filled 2017-02-24: qty 1000

## 2017-02-24 MED ORDER — ROCURONIUM BROMIDE 50 MG/5ML IV SOSY
PREFILLED_SYRINGE | INTRAVENOUS | Status: AC
  Filled 2017-02-24: qty 5

## 2017-02-24 MED ORDER — CEFAZOLIN SODIUM-DEXTROSE 2-4 GM/100ML-% IV SOLN
INTRAVENOUS | Status: AC
  Filled 2017-02-24: qty 100

## 2017-02-24 SURGICAL SUPPLY — 52 items
BENZOIN TINCTURE PRP APPL 2/3 (GAUZE/BANDAGES/DRESSINGS) ×3 IMPLANT
CHLORAPREP W/TINT 26ML (MISCELLANEOUS) ×3 IMPLANT
COVER TIP SHEARS 8 DVNC (MISCELLANEOUS) ×2 IMPLANT
COVER TIP SHEARS 8MM DA VINCI (MISCELLANEOUS) ×1
DRAPE ARM DVNC X/XI (DISPOSABLE) ×8 IMPLANT
DRAPE COLUMN DVNC XI (DISPOSABLE) ×2 IMPLANT
DRAPE DA VINCI XI ARM (DISPOSABLE) ×4
DRAPE DA VINCI XI COLUMN (DISPOSABLE) ×1
DRAPE SHEET LG 3/4 BI-LAMINATE (DRAPES) ×6 IMPLANT
DRAPE SURG IRRIG POUCH 19X23 (DRAPES) ×3 IMPLANT
DRSG TEGADERM 2-3/8X2-3/4 SM (GAUZE/BANDAGES/DRESSINGS) ×15 IMPLANT
ELECT REM PT RETURN 15FT ADLT (MISCELLANEOUS) ×3 IMPLANT
GAUZE SPONGE 2X2 8PLY STRL LF (GAUZE/BANDAGES/DRESSINGS) ×2 IMPLANT
GLOVE BIO SURGEON STRL SZ 6.5 (GLOVE) ×15 IMPLANT
GLOVE BIOGEL PI IND STRL 7.0 (GLOVE) ×4 IMPLANT
GLOVE BIOGEL PI INDICATOR 7.0 (GLOVE) ×2
GOWN STRL REUS W/ TWL LRG LVL3 (GOWN DISPOSABLE) ×6 IMPLANT
GOWN STRL REUS W/TWL LRG LVL3 (GOWN DISPOSABLE) ×3
HOLDER FOLEY CATH W/STRAP (MISCELLANEOUS) ×3 IMPLANT
IRRIG SUCT STRYKERFLOW 2 WTIP (MISCELLANEOUS) ×3
IRRIGATION SUCT STRKRFLW 2 WTP (MISCELLANEOUS) ×2 IMPLANT
KIT BASIN OR (CUSTOM PROCEDURE TRAY) ×3 IMPLANT
KIT PROCEDURE DA VINCI SI (MISCELLANEOUS) ×1
KIT PROCEDURE DVNC SI (MISCELLANEOUS) ×2 IMPLANT
MANIPULATOR UTERINE 4.5 ZUMI (MISCELLANEOUS) ×3 IMPLANT
NDL SAFETY ECLIPSE 18X1.5 (NEEDLE) ×2 IMPLANT
NEEDLE HYPO 18GX1.5 SHARP (NEEDLE) ×1
NEEDLE SPNL 18GX3.5 QUINCKE PK (NEEDLE) ×3 IMPLANT
OCCLUDER COLPOPNEUMO (BALLOONS) ×3 IMPLANT
PAD POSITIONING PINK XL (MISCELLANEOUS) ×3 IMPLANT
PORT ACCESS TROCAR AIRSEAL 12 (TROCAR) ×2 IMPLANT
PORT ACCESS TROCAR AIRSEAL 5M (TROCAR) ×1
POUCH SPECIMEN RETRIEVAL 10MM (ENDOMECHANICALS) IMPLANT
SEAL CANN UNIV 5-8 DVNC XI (MISCELLANEOUS) ×8 IMPLANT
SEAL XI 5MM-8MM UNIVERSAL (MISCELLANEOUS) ×4
SET TRI-LUMEN FLTR TB AIRSEAL (TUBING) ×3 IMPLANT
SHEET LAVH (DRAPES) ×3 IMPLANT
SOLUTION ELECTROLUBE (MISCELLANEOUS) ×3 IMPLANT
SPONGE GAUZE 2X2 STER 10/PKG (GAUZE/BANDAGES/DRESSINGS) ×1
STRIP CLOSURE SKIN 1/2X4 (GAUZE/BANDAGES/DRESSINGS) ×3 IMPLANT
SUT VIC AB 0 CT1 27 (SUTURE) ×1
SUT VIC AB 0 CT1 27XBRD ANTBC (SUTURE) ×2 IMPLANT
SUT VIC AB 4-0 PS2 27 (SUTURE) ×6 IMPLANT
SYR 10ML LL (SYRINGE) IMPLANT
SYR 50ML LL SCALE MARK (SYRINGE) ×3 IMPLANT
TOWEL OR 17X26 10 PK STRL BLUE (TOWEL DISPOSABLE) ×6 IMPLANT
TOWEL OR NON WOVEN STRL DISP B (DISPOSABLE) ×3 IMPLANT
TRAP SPECIMEN MUCOUS 40CC (MISCELLANEOUS) IMPLANT
TRAY LAPAROSCOPIC (CUSTOM PROCEDURE TRAY) ×3 IMPLANT
TROCAR BLADELESS OPT 5 100 (ENDOMECHANICALS) ×3 IMPLANT
UNDERPAD 30X30 INCONTINENT (UNDERPADS AND DIAPERS) ×3 IMPLANT
WATER STERILE IRR 1500ML POUR (IV SOLUTION) IMPLANT

## 2017-02-24 NOTE — Anesthesia Postprocedure Evaluation (Signed)
Anesthesia Post Note  Patient: Sheila Armstrong  Procedure(s) Performed: Procedure(s) (LRB): XI ROBOTIC ASSISTED TOTAL HYSTERECTOMY WITH BILATERAL SALPINGO OOPHORECTOMY (Bilateral) SENTINEL NODE BIOPSY (N/A)  Patient location during evaluation: PACU Anesthesia Type: General Level of consciousness: awake and alert and patient cooperative Pain management: pain level controlled Vital Signs Assessment: post-procedure vital signs reviewed and stable Respiratory status: spontaneous breathing and respiratory function stable Cardiovascular status: stable Anesthetic complications: no       Last Vitals:  Vitals:   02/24/17 1100 02/24/17 1120  BP: 112/67 123/73  Pulse: 82 86  Resp: 12 16  Temp: 36.3 C 36.4 C    Last Pain:  Vitals:   02/24/17 1126  TempSrc:   PainSc: [place]

## 2017-02-24 NOTE — Anesthesia Procedure Notes (Signed)
Procedure Name: Intubation Date/Time: 02/24/2017 7:40 AM Performed by: Dione Booze Pre-anesthesia Checklist: Emergency Drugs available, Suction available, Patient being monitored and Patient identified Patient Re-evaluated:Patient Re-evaluated prior to inductionOxygen Delivery Method: Circle system utilized Preoxygenation: Pre-oxygenation with 100% oxygen Intubation Type: IV induction Laryngoscope Size: Mac and 4 Grade View: Grade II Tube type: Oral Number of attempts: 1 Airway Equipment and Method: Stylet Placement Confirmation: ETT inserted through vocal cords under direct vision,  positive ETCO2 and breath sounds checked- equal and bilateral Secured at: 22 cm Tube secured with: Tape Dental Injury: Teeth and Oropharynx as per pre-operative assessment  Comments: Upper teeth lt center and left of that cracked preop. Intact after intubation. Gauze airway pack.

## 2017-02-24 NOTE — Interval H&P Note (Signed)
History and Physical Interval Note:  02/24/2017 7:24 AM  Sheila Armstrong  has presented today for surgery, with the diagnosis of ENDOMETRIAL ADENO CARCINOMA  The various methods of treatment have been discussed with the patient and family. After consideration of risks, benefits and other options for treatment, the patient has consented to  Procedure(s): XI ROBOTIC ASSISTED TOTAL HYSTERECTOMY WITH BILATERAL SALPINGO OOPHORECTOMY (Bilateral) SENTINEL NODE BIOPSY (N/A) as a surgical intervention .  The patient's history has been reviewed, patient examined, no change in status, stable for surgery.  I have reviewed the patient's chart and labs.  Questions were answered to the patient's satisfaction.     Tensed A.

## 2017-02-24 NOTE — Transfer of Care (Signed)
Immediate Anesthesia Transfer of Care Note  Patient: SHILEE BIGGS  Procedure(s) Performed: Procedure(s): XI ROBOTIC ASSISTED TOTAL HYSTERECTOMY WITH BILATERAL SALPINGO OOPHORECTOMY (Bilateral) SENTINEL NODE BIOPSY (N/A)  Patient Location: PACU  Anesthesia Type:General  Level of Consciousness: awake and patient cooperative  Airway & Oxygen Therapy: Patient Spontanous Breathing and Patient connected to face mask oxygen  Post-op Assessment: Report given to RN and Post -op Vital signs reviewed and stable  Post vital signs: Reviewed and stable  Last Vitals:  Vitals:   02/24/17 0527  BP: 138/77  Pulse: 94  Resp: 16  Temp: 36.4 C    Last Pain:  Vitals:   02/24/17 0527  TempSrc: Oral      Patients Stated Pain Goal: 4 (95/18/84 1660)  Complications: No apparent anesthesia complications

## 2017-02-24 NOTE — Op Note (Signed)
PATIENT: Sheila Armstrong DATE OF BIRTH: Nov 23, 1934 ENCOUNTER DATE: 02/24/17   Preop Diagnosis: grade 1 endometrioid adenocarcinoma.   Postoperative Diagnosis: same.   Surgery: Total robotic hysterectomy bilateral salpingo-oophorectomy, injection cervix for sentinel lymph node dissection, bilateral sentinel lymph node removal  Surgeons:  Sherisa Gilvin A. Alycia Rossetti, MD; Lahoma Crocker, MD   Anesthesia: General   Estimated blood loss: 50  ml   IVF: 1300 ml   Urine output: 038  ml   Complications: None   Pathology: Uterus, cervix, bilateral tubes and ovaries, right and left external iliac sentinel lymph nodes.  Operative findings: Flush cervix with vagina, normal uterus and bilateral adnexa, redundant RS colon, bilateral external iliac sentinel lymph nodes.  Specimens: 1) Uterus, cervix, bilateral adnexa 2) right and left external iliac sentinel lymph nodes  Procedure: The patient was identified in the preoperative holding area. Informed consent was signed on the chart. Patient was seen history was reviewed and exam was performed.   The patient was then taken to the operating room and placed in the supine position with SCD hose on. She was then placed in the dorsolithotomy position. Her arms were tucked at her side with appropriate precautions on the gel pad. General anesthesia was then induced without difficulty. Shoulder blocks were then placed in the usual fashion with appropriate precautions. A OG-tube was placed to suction. First timeout was performed to confirm the patient, procedure, antibiotic, allergy status, estimated blood loss and OR time. The perineum was then prepped in the usual fashion with Betadine. A 14 French Foley was inserted into the bladder under sterile conditions. A sterile speculum was placed in the vagina. The cervix was without lesions. The cervix was grasped with a single-tooth tenaculum. The cervix was injected with ICG via the FIRES protocol. The cervix was dilated  without difficulty. A ZUMI with a small Koe ring was placed without difficulty. The abdomen was then prepped with 1 Chlor prep sponge per protocol.   Patient was then draped after the prep was dried. Second timeout was performed to confirm the above. After again confirming OG tube placement and it was to suction. A stab-wound was made in left upper quadrant 2 cm below the costal margin on the left in the midclavicular line. A 5 mm operative report was used to assure intra-abdominal placement. The abdomen was insufflated. At this point all points during the procedure the patient's intra-abdominal pressure was not increased over 15 mm of mercury. After insufflation was complete, the patient was placed in deep Trendelenburg position. 25 cm above the pubic symphysis that area was marked the camera port. Bilateral robotic ports were marked 8 cm from the midline incision at approximately 5 angle. 3rd arm port was placed in the LLQ 2 cm superior and inferior to the ACIS. Under direct visualization each of the trochars was placed into the abdomen. The small bowel was folded on its mesentery to allow visualization to the pelvis. The 5 mm LUQ port was then converted to a 10/12 port under direct visualization.  After assuring adequate visualization, the robot was then docked in the usual fashion. Under direct visualization the robotic instruments replaced.   The round ligament on the patient's right side was transected with monopolar cautery. The posterior leaves of the broad ligament were then taken down in the usual fashion. The ureter was identified on the medial leaf of the broad ligament. A window was made between the IP and the ureter. The perirectal and perivesical spaces were opened. The obturator nerve  was identified. A lymphatic channel was noted under near infrared light and a right external iliac node was noted. It was removed in the usual fashion. No other nodes lit up with ICG. Our attention was turned to  the left side where the same procedure was performed. Neither node was suspicious and they were no sent for frozen section.  The IP on the right was coagulated with bipolar cautery and transected. The posterior leaf of the broad ligament was taken down to the level of the KOH ring. The bladder flap was created using meticulous dissection and pinpoint cautery. The uterine vessels were coagulated with bipolar cautery. The uterine vessels were then transected and the C loop was created. The same procedure was performed on the patient's left side.   The pneumo-occulder in the vagina was then insufflated. The colpotomy was then created in the usual fashion. The uterus was delivered through the vagina without difficulty. The vaginal cuff was closed with a running 0 Vicryl on CT 1 suture. The abdomen and pelvis were copiously irrigated and noted to be hemostatic. The robotic instruments were removed under direct visualization as were the robotic trochars. The pneumoperitoneum was removed. The patient was then taken out of the Trendelenburg position. Using of 0 Vicryl on a UR 6 needle the subcutaneous tissues of the port in the left upper quadrant was reapproximated. The skin was closed using 4-0 Vicryl. Steri-Strips and benzoin were applied. The pneumo occluded balloon was removed from the vagina. The vagina was swabbed and noted to be hemostatic.   All instrument needle and Ray-Tec counts were correct x2. The patient tolerated the procedure well and was taken to the recovery room in stable condition. This is [name] dictating an operative note on patient Sheila Armstrong.

## 2017-02-24 NOTE — Anesthesia Preprocedure Evaluation (Signed)
Anesthesia Evaluation  Patient identified by MRN, date of birth, ID band Patient awake    Reviewed: Allergy & Precautions, H&P , NPO status , Patient's Chart, lab work & pertinent test results  Airway Mallampati: II   Neck ROM: full    Dental   Pulmonary neg pulmonary ROS,    breath sounds clear to auscultation       Cardiovascular negative cardio ROS   Rhythm:regular Rate:Normal     Neuro/Psych Hearing loss    GI/Hepatic Constipation. Colonic polyps   Endo/Other    Renal/GU    Urinary leakage    Musculoskeletal   Abdominal   Peds  Hematology   Anesthesia Other Findings   Reproductive/Obstetrics Abnormal uterine bleeding. Endometrial adeno CA                             Anesthesia Physical Anesthesia Plan  ASA: II  Anesthesia Plan: General   Post-op Pain Management:    Induction: Intravenous  Airway Management Planned: Oral ETT  Additional Equipment:   Intra-op Plan:   Post-operative Plan: Extubation in OR  Informed Consent: I have reviewed the patients History and Physical, chart, labs and discussed the procedure including the risks, benefits and alternatives for the proposed anesthesia with the patient or authorized representative who has indicated his/her understanding and acceptance.     Plan Discussed with: CRNA, Anesthesiologist and Surgeon  Anesthesia Plan Comments:         Anesthesia Quick Evaluation

## 2017-02-24 NOTE — H&P (View-Only) (Signed)
Consult Note: Gyn-Onc   Sheila Armstrong 82 y.o. female  No chief complaint on file.   Assessment : Grade 1 endometrial adenocarcinoma.  Plan: I discussed the natural history of endometrial cancer with the patient had her husband. The cornerstone of therapy is to perform a hysterectomy and bilateral salpingo-oophorectomy. They understand that based on final pathology additional therapy such as radiation therapy or chemotherapy may be necessary. I discussed the pros and cons of open versus robotic surgery and the patient wishes to proceed with robotic-assisted left scopic bilateral salpingo-oophorectomy hysterectomy and sentinel node biopsies. The risks of surgery including hemorrhage, infection, injury to adjacent viscera, and anesthetic risks were reviewed. All questions are answered.   HPI: The patient has had intermittent vaginal bleeding (postmenopausal) since the summer of 2017. This was recently evaluated with an endometrial biopsy revealing a grade 1 endometrial carcinoma. The patient has no other symptoms. She specifically denies any pelvic pain or pressure. Her only prior pelvic surgery is that of a tubal ligation.  She has no family history of gynecologic or breast cancers. She has no other significant health morbidities.  Review of Systems:10 point review of systems is negative except as noted in interval history.   Vitals: Blood pressure 137/72, pulse (!) 102, temperature 98.4 F (36.9 C), temperature source Oral, resp. rate (!) 22, height 5\' 8"  (1.727 m), weight 153 lb 12.8 oz (69.8 kg).  Physical Exam: General : The patient is a healthy woman in no acute distress.  HEENT: normocephalic, extraoccular movements normal; neck is supple without thyromegally  Lynphnodes: Supraclavicular and inguinal nodes not enlarged  Abdomen: Soft, non-tender, no ascites, no organomegally, no masses, no hernias  Pelvic:  EGBUS: Normal female  Vagina: Normal, no lesions  Urethra and Bladder:  Normal, non-tender  Cervix: Flush with the vaginal vault Uterus: Anterior normal shape size and consistency Bi-manual examination: Non-tender; no adenxal masses or nodularity  Rectal: normal sphincter tone, no masses, no blood  Lower extremities: No edema or varicosities. Normal range of motion      Allergies  Allergen Reactions  . Sulfonamide Derivatives     REACTION: hives    Past Medical History:  Diagnosis Date  . Abnormal uterine bleeding   . ALLERGIC RHINITIS 07/07/2007  . COLONIC POLYPS, ADENOMATOUS, HX OF 11/19/2005  . CONSTIPATION 12/31/2009  . DIVERTICULOSIS, COLON 07/12/2008  . Family history of colon cancer   . Hearing loss   . SEBORRHEIC KERATOSIS, INFLAMED 03/21/2008  . SENILE VAGINITIS 01/17/2008    Past Surgical History:  Procedure Laterality Date  . COSMETIC SURGERY    . DILATION AND CURETTAGE OF UTERUS    . EYELID IMPLANT     right and left  . RHINOPLASTY      Current Outpatient Prescriptions  Medication Sig Dispense Refill  . cetirizine (ZYRTEC) 10 MG tablet Take 10 mg by mouth daily.    Marland Kitchen conjugated estrogens (PREMARIN) vaginal cream Small amounts twice weekly 45 g 11  . EPINEPHrine 0.3 mg/0.3 mL IJ SOAJ injection INJ UTD PRF SYSTEMIC REACTIONS  0  . fluticasone (FLONASE) 50 MCG/ACT nasal spray 2 sprays by Each Nare route daily for 30 days.    . Multiple Vitamin (MULTIVITAMIN) capsule Take 1 capsule by mouth daily.    . NON FORMULARY Allergy Injections- weekly    . Probiotic Product (ALIGN) 4 MG CAPS Take 1 tablet by mouth daily.     No current facility-administered medications for this visit.     Social History  Social History  . Marital status: Married    Spouse name: N/A  . Number of children: N/A  . Years of education: N/A   Occupational History  . Not on file.   Social History Main Topics  . Smoking status: Never Smoker  . Smokeless tobacco: Never Used  . Alcohol use No  . Drug use: No  . Sexual activity: No   Other Topics Concern   . Not on file   Social History Narrative   Married for 9 years    Two children ( both live locally)    Retired - [place]       She likes to travel     Family History  Problem Relation Age of Onset  . Colon cancer Mother 82  . Heart disease Father   . Anuerysm Sister       Marti Sleigh, MD 02/06/2017, 1:06 PM      Consult Note: Gyn-Onc   Sheila Armstrong 82 y.o. female  No chief complaint on file.   Assessment :  Plan:  Interval History:   HPI:  Review of Systems:10 point review of systems is negative except as noted in interval history.   Vitals: Blood pressure 137/72, pulse (!) 102, temperature 98.4 F (36.9 C), temperature source Oral, resp. rate (!) 22, height 5\' 8"  (1.727 m), weight 153 lb 12.8 oz (69.8 kg).  Physical Exam: General : The patient is a healthy woman in no acute distress.  HEENT: normocephalic, extraoccular movements normal; neck is supple without thyromegally  Lynphnodes: Supraclavicular and inguinal nodes not enlarged  Abdomen: Soft, non-tender, no ascites, no organomegally, no masses, no hernias  Pelvic:  EGBUS: Normal female  Vagina: Normal, no lesions  Urethra and Bladder: Normal, non-tender  Cervix: Surgically absent  Uterus: Surgically absent  Bi-manual examination: Non-tender; no adenxal masses or nodularity  Rectal: normal sphincter tone, no masses, no blood  Lower extremities: No edema or varicosities. Normal range of motion      Allergies  Allergen Reactions  . Sulfonamide Derivatives     REACTION: hives    Past Medical History:  Diagnosis Date  . Abnormal uterine bleeding   . ALLERGIC RHINITIS 07/07/2007  . COLONIC POLYPS, ADENOMATOUS, HX OF 11/19/2005  . CONSTIPATION 12/31/2009  . DIVERTICULOSIS, COLON 07/12/2008  . Family history of colon cancer   . Hearing loss   . SEBORRHEIC KERATOSIS, INFLAMED 03/21/2008  . SENILE VAGINITIS 01/17/2008    Past Surgical History:  Procedure  Laterality Date  . COSMETIC SURGERY    . DILATION AND CURETTAGE OF UTERUS    . EYELID IMPLANT     right and left  . RHINOPLASTY      Current Outpatient Prescriptions  Medication Sig Dispense Refill  . cetirizine (ZYRTEC) 10 MG tablet Take 10 mg by mouth daily.    Marland Kitchen conjugated estrogens (PREMARIN) vaginal cream Small amounts twice weekly 45 g 11  . EPINEPHrine 0.3 mg/0.3 mL IJ SOAJ injection INJ UTD PRF SYSTEMIC REACTIONS  0  . fluticasone (FLONASE) 50 MCG/ACT nasal spray 2 sprays by Each Nare route daily for 30 days.    . Multiple Vitamin (MULTIVITAMIN) capsule Take 1 capsule by mouth daily.    . NON FORMULARY Allergy Injections- weekly    . Probiotic Product (ALIGN) 4 MG CAPS Take 1 tablet by mouth daily.     No current facility-administered medications for this visit.     Social History   Social History  . Marital status:  Married    Spouse name: N/A  . Number of children: N/A  . Years of education: N/A   Occupational History  . Not on file.   Social History Main Topics  . Smoking status: Never Smoker  . Smokeless tobacco: Never Used  . Alcohol use No  . Drug use: No  . Sexual activity: No   Other Topics Concern  . Not on file   Social History Narrative   Married for 9 years    Two children ( both live locally)    Retired - [place]       She likes to travel     Family History  Problem Relation Age of Onset  . Colon cancer Mother 67  . Heart disease Father   . Anuerysm Sister       Marti Sleigh, MD 02/06/2017, 1:07 PM

## 2017-02-25 ENCOUNTER — Telehealth: Payer: Self-pay | Admitting: *Deleted

## 2017-02-25 DIAGNOSIS — C541 Malignant neoplasm of endometrium: Secondary | ICD-10-CM | POA: Diagnosis not present

## 2017-02-25 LAB — BASIC METABOLIC PANEL
Anion gap: 7 (ref 5–15)
BUN: 15 mg/dL (ref 6–20)
CALCIUM: 8.8 mg/dL — AB (ref 8.9–10.3)
CHLORIDE: 105 mmol/L (ref 101–111)
CO2: 22 mmol/L (ref 22–32)
CREATININE: 0.76 mg/dL (ref 0.44–1.00)
GFR calc Af Amer: >60 mL/min (ref >60)
GFR calc non Af Amer: >60 mL/min (ref >60)
Glucose, Bld: 127 mg/dL — ABNORMAL HIGH (ref 65–99)
Potassium: 4.3 mmol/L (ref 3.5–5.1)
SODIUM: 134 mmol/L — AB (ref 135–145)

## 2017-02-25 LAB — CBC
HCT: 30.6 % — ABNORMAL LOW (ref 36.0–46.0)
HEMOGLOBIN: 10.3 g/dL — AB (ref 12.0–15.0)
MCH: 30.9 pg (ref 26.0–34.0)
MCHC: 33.7 g/dL (ref 30.0–36.0)
MCV: 91.9 fL (ref 78.0–100.0)
Platelets: 185 10*3/uL (ref 150–400)
RBC: 3.33 MIL/uL — ABNORMAL LOW (ref 3.87–5.11)
RDW: 13.9 % (ref 11.5–15.5)
WBC: 13.9 10*3/uL — ABNORMAL HIGH (ref 4.0–10.5)

## 2017-02-25 MED ORDER — OXYCODONE-ACETAMINOPHEN 5-325 MG PO TABS: 1.0000 | ORAL_TABLET | ORAL | 0 refills | Status: DC | PRN

## 2017-02-25 NOTE — Telephone Encounter (Signed)
I have left the patient a message to call the office. Patient needs a post op appt.

## 2017-02-25 NOTE — Discharge Summary (Signed)
Physician Discharge Summary  Patient ID: Sheila Armstrong MRN: [id number] DOB/AGE: [birth year] 82 y.o.  Admit date: 02/24/2017 Discharge date: 02/25/2017  Admission Diagnoses: Endometrial cancer Discharge Diagnoses:  Active Problems:   Endometrial cancer [facility])   Discharged Condition: good  Hospital Course: On 02/24/2017, the patient underwent the following: Procedure(s): XI ROBOTIC ASSISTED TOTAL HYSTERECTOMY WITH BILATERAL SALPINGO OOPHORECTOMY SENTINEL NODE BIOPSY.   The postoperative course was uneventful.  She was discharged to home on postoperative day 1 tolerating a regular diet.  Consults: None  Significant Diagnostic Studies: None  Treatments: surgery: see above  Discharge Exam: Blood pressure (!) 104/49, pulse 74, temperature 97.6 F (36.4 C), temperature source Oral, resp. rate 16, height 5\' 8"  (1.727 m), weight 153 lb (69.4 kg), SpO2 97 %. General appearance: alert Resp: clear to auscultation bilaterally Cardio: regular rate and rhythm, S1, S2 normal, no murmur, click, rub or gallop GI: soft, non-tender; bowel sounds normal; no masses,  no organomegaly Extremities: extremities normal, atraumatic, no cyanosis or edema Incision/Wound: C/D/I  Disposition: Final discharge disposition not confirmed  Discharge Instructions    Call MD for:  extreme fatigue    Complete by:  As directed    Call MD for:  persistant dizziness or light-headedness    Complete by:  As directed    Call MD for:  persistant nausea and vomiting    Complete by:  As directed    Call MD for:  redness, tenderness, or signs of infection (pain, swelling, redness, odor or green/yellow discharge around incision site)    Complete by:  As directed    Call MD for:  severe uncontrolled pain    Complete by:  As directed    Call MD for:  temperature >100.4    Complete by:  As directed    Diet - low sodium heart healthy    Complete by:  As directed    Diet Carb Modified    Complete by:  As directed    Diet general    Complete by:  As directed    Discharge instructions    Complete by:  As directed    Activity: 1. Be up and out of the bed during the day.  Take a nap if needed.  You may walk up steps but be careful and use the hand rail.  Stair climbing will tire you more than you think, you may need to stop part way and rest.   2. No lifting or straining for 6 weeks.  3. No driving for 1-2 weeks.  Do Not drive if you are taking narcotic pain medicine.  4. Shower daily.  Use soap and water on your incision and pat dry; don't rub.   5. No sexual activity and nothing in the vagina for 4 weeks.  Diet: 1. Low sodium Heart Healthy Diet is recommended.  2. It is safe to use a laxative if you have difficulty moving your bowels.   Wound Care: 1. Keep clean and dry.  Shower daily.  Reasons to call the Doctor:  Fever - Oral temperature greater than 100.4 degrees Fahrenheit Foul-smelling vaginal discharge Difficulty urinating Nausea and vomiting Increased pain at the site of the incision that is unrelieved with pain medicine. Difficulty breathing with or without chest pain New calf pain especially if only on one side Sudden, continuing increased vaginal bleeding with or without clots.   Follow-up: 1. See Sheila Armstrong in 4 weeks.  Contacts: For questions or concerns you should contact:  Dr. Everitt Armstrong  at [phone]  or at [place]   Discharge wound care:    Complete by:  As directed    Keep clean and dry   Driving Restrictions    Complete by:  As directed    No driving for 1- 2 weeks   Increase activity slowly    Complete by:  As directed    Lifting restrictions    Complete by:  As directed    No lifting > 5 lbs for 6 weeks   May shower / Bathe    Complete by:  As directed    No tub baths for 6 weeks   Sexual Activity Restrictions    Complete by:  As directed    No intercourse for 6 - 8 weeks     Allergies as of 02/25/2017      Reactions   Sulfonamide  Derivatives Hives      Medication List    STOP taking these medications   conjugated estrogens vaginal cream Commonly known as:  PREMARIN     TAKE these medications   ALIGN 4 MG Caps Take 1 tablet by mouth daily. What changed:  when to take this  reasons to take this   calcium-vitamin D 500-200 MG-UNIT tablet Commonly known as:  OSCAL WITH D Take 1 tablet by mouth daily with breakfast.   cetirizine 10 MG tablet Commonly known as:  ZYRTEC Take 10 mg by mouth daily as needed for allergies.   EPINEPHrine 0.3 mg/0.3 mL Soaj injection Commonly known as:  EPI-PEN INJ UTD PRF SYSTEMIC REACTIONS   fluticasone 50 MCG/ACT nasal spray Commonly known as:  FLONASE Place 2 sprays into both nostrils daily as needed for allergies or rhinitis.   multivitamin capsule Take 1 capsule by mouth daily.   NON FORMULARY Allergy Injections- weekly   oxyCODONE-acetaminophen 5-325 MG tablet Commonly known as:  PERCOCET/ROXICET Take 1-2 tablets by mouth every 4 (four) hours as needed (moderate to severe pain (when tolerating fluids)).        SignedLahoma Armstrong A 02/25/2017, 10:41 AM

## 2017-02-25 NOTE — Progress Notes (Signed)
Pt voided. D/C instructions and prescription was given.  Understanding was verbalized.

## 2017-02-25 NOTE — Discharge Instructions (Signed)
Planning for Recovery and Going Home Your Guide to Gynecologic Surgery     In-Hospital Recovery Plan Team Caring for You After Surgery In addition to the nursing staff on the unit, the gynecological surgery team will care for you. This team is led by your surgeon and includes a resident in his last year of training, as well as other residents, medical students and a physician assistant or nurse practitioner. There will be a physician in the hospital 24 hours a day to tend to your needs. The residents and students report directly to your surgeon, who is the one overseeing all of your care.  Pain Relief After Surgery Your pain will be assessed regularly on a scale from 0 to 10. Pain assessment is  necessary to guide your pain relief. It is essential that you are able to take deep breaths, cough and move. Prevention or early treatment of pain is far more effective than trying to treat severe pain. Therefore, we have devised a specialized regimen to stay ahead of your pain and use almost no narcotics, which can slow down your recovery process. If you have an epidural catheter, you will receive a  constant infusion of pain medication through your epidural. If you need additional pain relief, you will be able to push a button to increase the medication in your epidural. You will also be given acetaminophen and an ibuprofen-like medication to keep your pain under control.  You can always ask for additional pain pills if you are not comfortable. In most cases an anesthesiologist with expertise in pain management will visit you every day and help design your pain management plan.  One Day After Surgery Focus on drinking and walking. You will start drinking clear liquids after surgery. The intravenous fluids will be stopped, and the catheter may be removed  from your bladder. We expect you to get out of bed, with the nurses' or assistants' help, sit in a chair for six hours and start to move  about in the hallways. You will also meet with a case manager to assess your discharge needs, including home nursing. Your physician may order home care to assist with your transition home.  Home nursing visits, which are intermittent, help you get readjusted to home by teaching treatments, monitoring medications, and performing clinical assessment and reporting back to your physician. Other services may include therapy and medical equipment; private duty services are also available. If you are going "home" to a different address upon discharge, please alert [place]. A Home Care Coordinator can visit with you while in the hospital to discuss your options. If you have questions please speak with your case manager. If you need rehabilitation at a facility, a social worker will assist with this. If you need rehabilitation at a facility, a social worker will assist with this. If your procedure was performed in a minimally invasive fashion, you will be discharged to home if your pain is well controlled and you are tolerating a regular diet.     Two Days After Surgery You will start eating a soft diet and change to a more solid diet as you feel up to it. The catheter from your bladder will be removed, if not already done so. If there is a dressing on your wound, it will be removed. The tubing will be disconnected from your IV. We expect you to be out of bed for the majority of the day and walking at least three times in the hallway, with assistance as  needed.  You may be discharged at this point if it is felt you are ready.   Three Days After Surgery You continue to eat your low residue diet. You may be ready to go home if you are drinking enough to keep yourself hydrated, your pain is well controlled, you are not belching or nauseated, you are passing gas and you are able to get around on your own. However, we will not discharge you from the hospital until we are sure you  are ready.  Discharge Discharge time is at 10 a.m. You will need to make arrangements for someone to accompany you home. You will not be released without someone present. Please keep in mind that we strive to get patients discharged as quickly as possible, but there may be delays for a variety of reasons. Complications That May Delay Discharge: ? Nausea and vomiting: It is very common to feel sick after your surgery. We give you medication to reduce this. However, if you do feel sick, you should reduce the amount you are taking by mouth. Small, frequent meals or drinks are best in this  situation. As long as you can drink and keep yourself hydrated, the nausea will likely pass.  Ileus: Following surgery, the bowel can be sluggish, making it difficult for food and gas to pass through the intestines. This is called an ileus. We have designed our care program to do everything possible to reduce the likelihood of an ileus. If you do develop an ileus, it usually only lasts two to three days. However, it may require a small tube down the nose to decompress the stomach. The best way to avoid an ileus is to reduce the amount of narcotic pain medications, get up as much as possible after your surgery, and stimulate the bowel early after surgery with small amounts of food and liquids.  Wound infection: If a wound infection develops, this usually happens three to ten days after surgery.    Urinary retention: This is if you are unable to urinate after the catheter from your bladder is removed. The catheter may need to be reinserted until you are able to urinate on your own. This can be caused by anesthesia, pain medication and decreased activity.    When you are preparing to go home, you will receive:  Detailed discharge instructions, with information about your operation and medications    All prescriptions for medications you need at home; prescriptions can be filled while you are in the  hospital if you would like    You may be prescribed Lovenox. Lovenox is used to reduce the risk of developing a blood clot after surgery. An appointment to see your surgeon or provider one to two weeks after you leave the hospital for follow-up   After Discharge Once you are discharged: Call us at any time if you are worried about your recovery or if you should have any questions. During regular office hours, (8:30 a.m.-4:30 p.m.), and after hours call [phone].  Call us immediately if:  You have a fever higher than 100.4 degrees.   Your wound is red, more painful or has drainage.    You are nauseated, vomiting or can't keep liquids down.    Your pain is worse and not able to be controlled with the regimen you were sent home with.    If you are bleeding heavily or have a lot of fluid coming from your vagina. If you are on narcotics, the goal is to  wean you off of them. If you are running low on supply and need more, call the nurse a few days before you will run out.  It is generally easier to reach someone between 8:30 a.m. - 4:30 p.m., so call early if you think something is not right. A nurse or nurse practitioner is available every day to answer your questions. After hours and on the weekends, the calls go to the resident doctors in the hospital. It may take longer for your phone call to be returned during this time. If you have a true emergency, such as severe abdominal pain, chest pain, shortness of breath or any other acute issues, call 911 and go to the local emergency room. Have them contact our team once you are stable.  Concerns After Discharge Bowel Function Following Your Surgery Your bowels will take several weeks to settle down and may be unpredictable at first. Your bowel movements may become loose, or you may be constipated. For the vast number of patients, this will get back to normal with time. Make sure you eat nutritious meals, drink plenty of fluids  and take regular walks during the first two weeks after your operation. Your Guide to Gynecologic Surgery    Abdominal Pain It is not unusual to suffer gripping pains (colic) during the first week following removal of a portion of your bowel. This pain usually lasts for a few minutes but goes away between spasms. If you have severe pain lasting more than one to two hours or have a fever and feel generally unwell, you should contact us at  the telephone contact numbers listed at the end of this packet. Hysterectomy: You should have pelvic rest for six (6) weeks or as specified by your doctor after surgery. You should have nothing in the vagina (no tampons, douching, intercourse, etc.,) during this time period. If you have some vaginal spotting, this is normal. If you have heavy bleeding or  a lot of fluid from your vagina, this is NOT normal and you should contact your doctor's office or, if after hours, contact the doctor on call.  Diarrhea: Fiber and Imodium (Loperamide) The first step to improving your frequent or loose stools is to bulk up the stool with fiber. Metamucil is the most common type of fiber that is available at any drug store. Start with 1 teaspoon mixed into food, like yogurt or oatmeal, in the morning and evening. Try not to drink any fluid for one hour after you take the fiber. This will allow the fiber to act like a sponge in your intestines, soaking up all the excess water. Continue this for three to five days. You may increase by 1 teaspoon every three to five days until the desired affect, or you are at 1 tablespoon (3 teaspoons) twice a day. If this doesn't work, you may try over-the-counter Loperamide, which is an antidiarrheal medication. You may take one tablet in the morning and evening or 30 minutes before you typically have diarrhea. You may take up to eight of these tablets daily. It is best to discuss this with [place] prior to using this medication. If you  have continuous diarrhea and abdominal cramping, call [phone].  Foley Catheter Your surgeon may recommend you be discharged home with a foley catheter (bladder catheter) for 1 to 2 weeks. Typically this recommendation will be made for patients undergoing surgery to the lower urinary tract. Before you leave the hospital, your nurse should outfit you with a clip  on the inner thigh to secure the catheter to prevent pulling as well as a small bag that can be easily worn on the upper leg under loose fitting pants and skirts. Your nurse will teach you how to exchange the large bag that typically comes with the catheter for the small bag. You may find it convenient to attach the small bag when active during the day and then the large bag when sleeping at night. If there is ever a point when you notice the catheter is not draining urine and youbegin to develop pain behind/above the pubic bone, you should report to the clinic or emergency room immediately as the catheter may be kinked or clogged. Kinking or clogging of the catheter prevents urine from draining from your bladder. Urine will quickly build up in the bladder and can cause severe pain as well as seriously disrupt healing if you have undergone surgery on the lower urinary tract. Additionally, pulling on the catheter can result in displacement of the balloon at the end of the catheter from inside of to outside  of the bladder. This also results in severe pain and can cause bleeding. For this reason, secure the catheter to the clip on your inner thigh at all times as the clip prevents against pulling.  Wound Care For the first few weeks following surgery, your wound may be slightly red and uncomfortable. You may shower and let the soapy water wash over your incision. Avoid soaking in the tub for one month following surgery or until the wound is well healed. It will take the wound several months to "soften." It is common to have bumpy areas  in the wound near the belly  button and at the ends of the incision.  If you have staples, these should be removed when you are seen by your surgeon at the follow-up appointment. You may have a glue-like material on your incision. Do not pick at this. It will come off over time. It is the surgical glue used in surgery to close your incision. You also have sutures inside of you that will dissolve over time  Post-Surgery Diet Attention to good nutrition after surgery is important to your recovery. If you had no dietary restrictions prior to the surgery, you will have no special dietary restrictions after the surgery. However, consuming enough protein, calories, vitamins and minerals is necessary to support healing. Some patients find their appetite is less than normal after surgery. In this case, frequent small meals throughout the day may help. It is not uncommon to lose 10 to 15 pounds after surgery. However, by the fourth to fifth week, your weight loss should stabilize. It is normal that certain foods taste different and certain smells may make you nauseas. Over time, the amount you can comfortably consume will gradually increase. You should try to eat a balanced diet, which includes:  Foods that are soft, moist, and easy to chew and swallow    Canned or soft-cooked fruits and vegetables   Plenty of soft breads, rice, pasta, potatoes and other starchy foods (lowerfiber  varieties may be tolerated better initially)    High-protein foods and beverages, such as meats, eggs, milk, cottage cheese  or a supplemental nutrition drink like Boost or Ensure    Drink plenty of fluids-at least 8 to 10 cups per day. This includes water,  fruit juice, Gatorade, teas/coffee and milk. Drinking plenty is especially important if you have loose stools (diarrhea).   Avoid drinking a lot of  caffeine, since this may dehydrate you.    Avoid fried, greasy and highly seasoned or spicy foods.    Avoid  carbonated beverages in the first couple weeks.    Avoid raw fruits and vegetables.   Hobbies/Activities Walking is encouraged after your surgery. You should plan to undertake regular exercise several times a day and gradually increase this during the four weeks following your operation until you are back to your normal level of activity. You may climb stairs. Don't do any heavy lifting greater than 10 pounds or contact sports for the first month after your surgery. Generally, you can return to hobbies and activities soon after your surgery. This will help you recover. It can take up to two to three months to fully recover. It is not unusual to be fatigued and require an afternoon nap for up to six to eight weeks following surgery. Your body is using this energy to heal your wounds. Set small goals for yourself and try to do a little more each day.  Work It is normal to return to work three to six weeks following your operation. If your job involves heavy manual work, then you should wait six weeks. However, you should check with your employer regarding rules, which may be relevant to your return to work. If you need a return-to-work form for your employer or disability papers, bring them to your follow-up appointment or fax them to our office at [phone].  Driving You may drive when you are off narcotics and pain-free enough to react quickly with your braking foot. For most patients, this occurs at one to four weeks following surgery.   Write down any questions you may have to ask your care team.  Important Contact Numbers: GYN Oncology Office: [phone]

## 2017-02-27 ENCOUNTER — Telehealth: Payer: Self-pay | Admitting: Gynecologic Oncology

## 2017-02-27 ENCOUNTER — Ambulatory Visit (HOSPITAL_COMMUNITY)
Admission: RE | Admit: 2017-02-27 | Discharge: 2017-02-27 | Disposition: A | Discharge status: Home / Self Care | Payer: Medicare Other | Source: Ambulatory Visit | Attending: Gynecologic Oncology | Admitting: Gynecologic Oncology

## 2017-02-27 ENCOUNTER — Telehealth: Payer: Self-pay | Admitting: *Deleted

## 2017-02-27 DIAGNOSIS — M7989 Other specified soft tissue disorders: Secondary | ICD-10-CM | POA: Diagnosis not present

## 2017-02-27 NOTE — Progress Notes (Signed)
*  PRELIMINARY RESULTS* Vascular Ultrasound Bilateral lower extremity venous duplex has been completed.  Preliminary findings: No evidence of deep vein thrombosis in the visualized veins of the lower extremities.  Very difficult visualization of the femoral and calf veins bilaterally due to tissue shadowing/artifact.  Preliminary results called to Dr. Denman George @ 15:35, patient is okay to go home.   Sheila Armstrong 02/27/2017, 4:01 PM

## 2017-02-27 NOTE — Telephone Encounter (Signed)
I have called and spoke with the patient and gave her the post op appt on May 16th at 10:15am. Patient is aware

## 2017-02-27 NOTE — Telephone Encounter (Signed)
Dr. Denman George called after speaking with patient earlier and wants Ms Sheila Armstrong to have a doppler study done as noted in phone message documented at [phone].   Pt will go to [facility] for 3 pm for doppler study and call Dr. Denman George with results at [phone].

## 2017-02-27 NOTE — Telephone Encounter (Signed)
Patient informed me that since Tuesday morning (POD 1) she has had unilateral left lower extremity edema.  It is not hot or red or painful.  We will obtain dopplers of the LE's to rule out dvt.  Donaciano Eva, MD

## 2017-02-28 ENCOUNTER — Telehealth: Payer: Self-pay | Admitting: Gynecologic Oncology

## 2017-02-28 NOTE — Telephone Encounter (Signed)
TC to patient, she is doing well. We discussed her pathology results and that we would recommend vaginal cuff brachytherapy secondary secondary to her age and the depth of invasion. She is amenable to this.

## 2017-03-03 ENCOUNTER — Telehealth: Payer: Self-pay

## 2017-03-03 NOTE — Telephone Encounter (Signed)
-----   Message from Everitt Amber, MD sent at 03/02/2017 11:27 AM EDT ----- Barbaraann Share, Would you mind checking to see how Ms Vaillancourt is feeling today? Has her leg swelling improved? Terrence Dupont

## 2017-03-03 NOTE — Telephone Encounter (Signed)
Ms Wince states that she is feeling well.  The swelling has gone down in her legs.  She is able to lay down in the bed now.   She does not feel that she needs to come in and see Dr. Denman George. She will review treatment options with Dr. Alycia Rossetti at [facility] on May 16 th per patient. Dr. Alycia Rossetti reviewed pathology results with her on the phone 02-28-17.

## 2017-03-25 ENCOUNTER — Ambulatory Visit: Payer: Medicare Other | Attending: Gynecologic Oncology | Admitting: Gynecologic Oncology

## 2017-03-25 ENCOUNTER — Encounter: Payer: Self-pay | Admitting: Radiation Oncology

## 2017-03-25 ENCOUNTER — Encounter: Payer: Self-pay | Admitting: Gynecologic Oncology

## 2017-03-25 VITALS — BP 127/70 | HR 100 | Temp 98.2°F | Resp 22 | Wt 145.0 lb

## 2017-03-25 DIAGNOSIS — Z882 Allergy status to sulfonamides status: Secondary | ICD-10-CM | POA: Insufficient documentation

## 2017-03-25 DIAGNOSIS — C541 Malignant neoplasm of endometrium: Secondary | ICD-10-CM

## 2017-03-25 DIAGNOSIS — Z79899 Other long term (current) drug therapy: Secondary | ICD-10-CM | POA: Insufficient documentation

## 2017-03-25 NOTE — Patient Instructions (Signed)
Plan to meet with Dr. Sondra Come to discuss radiation therapy.  You will follow up with Dr. Alycia Rossetti after radiation in six months.

## 2017-03-25 NOTE — Progress Notes (Signed)
Consult Note: Gyn-Onc  Sheila Armstrong 82 y.o. female  CC:  Chief Complaint  Patient presents with  . Endometrial Cancer    HPI:  Referring physician Dr. Sumner Boast   82 year old with intermittent vaginal bleeding (postmenopausal) since the summer of 2017. This was recently evaluated with an endometrial biopsy revealing a grade 1 endometrial carcinoma. The patient has no other symptoms. She specifically denies any pelvic pain or pressure. Her only prior pelvic surgery is that of a tubal ligation. She was initially seen by Dr. Fermin Schwab in consultation however I performed her surgery.  She has no family history of gynecologic or breast cancers. She has no other significant health morbidities Interval History:  ENCOUNTER DATE: 02/24/17 Surgery: Total robotic hysterectomy bilateral salpingo-oophorectomy, injection cervix for sentinel lymph node dissection, bilateral sentinel lymph node removal Pathology: Uterus, cervix, bilateral tubes and ovaries, right and left external iliac sentinel lymph nodes. Operative findings: Flush cervix with vagina, normal uterus and bilateral adnexa, redundant RS colon, bilateral external iliac sentinel lymph nodes.  Specimens: 1) Uterus, cervix, bilateral adnexa 2) right and left external iliac sentinel lymph nodes  Diagnosis 1. Lymph node, sentinel, biopsy, right external iliac - ONE BENIGN LYMPH NODE (0/1). 2. Lymph node, sentinel, biopsy, left external iliac - ONE BENIGN LYMPH NODE (0/1). 3. Uterus +/- tubes/ovaries, neoplastic, cervix - ENDOMETRIUM: ENDOMETRIOID ADENOCARCINOMA, 5.3 CM. - TUMOR FOCALLY INVADES DEEP MYOMETRIUM. - MARGINS NOT INVOLVED. - CERVIX, BILATERAL OVARIES AND BILATERAL FALLOPIAN TUBES FREE OF TUMOR.  She comes in today for her postoperative check. She overall doing very well. Her energy level is slowly improving. She was able to get 10,000 steps in yesterday which was the first time since her surgery. She does  complain of soreness at the level of the umbilicus but that is getting better. She thinks that might a bit as she had a flare diverticulosis this past week. She ate too much salad and strawberries and has some abdominal pain and diarrhea but has now improved. Otherwise her bowels prior to this episode had been doing fine she does use Benefiber on a regular basis. She has normal bladder function. She denies any vaginal bleeding. Lower extremity swelling on her left. She states that that quickly resolved and she's not experienced that since.  Current Meds:  Outpatient Encounter Prescriptions as of 03/25/2017  Medication Sig  . calcium-vitamin D (OSCAL WITH D) 500-200 MG-UNIT tablet Take 1 tablet by mouth daily with breakfast.  . cetirizine (ZYRTEC) 10 MG tablet Take 10 mg by mouth daily as needed for allergies.   Marland Kitchen EPINEPHrine 0.3 mg/0.3 mL IJ SOAJ injection INJ UTD PRF SYSTEMIC REACTIONS  . fluticasone (FLONASE) 50 MCG/ACT nasal spray Place 2 sprays into both nostrils daily as needed for allergies or rhinitis.  . Multiple Vitamin (MULTIVITAMIN) capsule Take 1 capsule by mouth daily.  . NON FORMULARY Allergy Injections- weekly  . oxyCODONE-acetaminophen (PERCOCET/ROXICET) 5-325 MG tablet Take 1-2 tablets by mouth every 4 (four) hours as needed (moderate to severe pain (when tolerating fluids)).  . Probiotic Product (ALIGN) 4 MG CAPS Take 1 tablet by mouth daily. (Patient taking differently: Take 1 tablet by mouth daily as needed (stomach upset or diarrhea). )   No facility-administered encounter medications on file as of 03/25/2017.     Allergy:  Allergies  Allergen Reactions  . Sulfonamide Derivatives Hives    Social Hx:   Social History   Social History  . Marital status: Married    Spouse name: N/A  .  Number of children: N/A  . Years of education: N/A   Occupational History  . Not on file.   Social History Main Topics  . Smoking status: Never Smoker  . Smokeless tobacco: Never  Used  . Alcohol use No  . Drug use: No  . Sexual activity: No   Other Topics Concern  . 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 COSMETIC SURGERY    . DILATION AND CURETTAGE OF UTERUS    . EYELID IMPLANT     right and left  . RHINOPLASTY    . ROBOTIC ASSISTED TOTAL HYSTERECTOMY WITH BILATERAL SALPINGO OOPHERECTOMY Bilateral 02/24/2017   Procedure: XI ROBOTIC ASSISTED TOTAL HYSTERECTOMY WITH BILATERAL SALPINGO OOPHORECTOMY;  Surgeon: [name], MD;  Location: [facility];  Service: Gynecology;  Laterality: Bilateral;  . SENTINEL NODE BIOPSY N/A 02/24/2017   Procedure: SENTINEL NODE BIOPSY;  Surgeon: [name], MD;  Location: [facility];  Service: Gynecology;  Laterality: N/A;    Past Medical Hx:  Past Medical History:  Diagnosis Date  . Abnormal uterine bleeding   . ALLERGIC RHINITIS 07/07/2007  . COLONIC POLYPS, ADENOMATOUS, HX OF 11/19/2005  . CONSTIPATION 12/31/2009  . DIVERTICULOSIS, COLON 07/12/2008  . Family history of colon cancer   . Hearing loss   . SEBORRHEIC KERATOSIS, INFLAMED 03/21/2008  . SENILE VAGINITIS 01/17/2008    Oncology Hx:   No history exists.    Family Hx:  Family History  Problem Relation Age of Onset  . Colon cancer Mother 64  . Heart disease Father   . Anuerysm Sister     Vitals:  Please refer to epic  Physical Exam: Well-nourished well-developed female in no acute distress.  Abdomen: Well-healed surgical incisions. Abdomen is soft, nontender, nondistended. There are no incisional hernias. There was one small stitch at the left lower quadrant port site that was removed without difficulty today.  Groins: No lymphadenopathy.  Extremities: No edema.  Pelvic: External genitalia within normal limits. Vagina is atrophic. The vaginal cuff is visualized.  It is healing well. The vaginal cuff is intact. Bimanual examination reveals no fluctuance, masses, tenderness.  Assessment/Plan: 82 year old with a stage IB grade 1 endometrioid adenocarcinoma with no lymphovascular space invasion and negative sentinel lymph nodes. Based on her age and depth of invasion she qualifies for vaginal cuff brachytherapy. She is amenable to this. A referral was placed for Dr. Sondra Come. She'll return to see [place] in 6 months.  Her pathology was reviewed with her by phone as well as it was today. Her questions as well as those of her husband were elicited in answer to their satisfaction. She was provided a copy of her pathology report.  Zyier Dykema A., MD 03/25/2017, 10:14 AM

## 2017-03-27 NOTE — Progress Notes (Signed)
GYN Location of Tumor / Histology: Endometrial Cancer  AVRIEL KANDEL presented with symptoms of: intermittent vaginal bleeding (postmenopausal) since the summer of 2017  Biopsies revealed:   02/24/17 Diagnosis 1. Lymph node, sentinel, biopsy, right external iliac - ONE BENIGN LYMPH NODE (0/1). 2. Lymph node, sentinel, biopsy, left external iliac - ONE BENIGN LYMPH NODE (0/1). 3. Uterus +/- tubes/ovaries, neoplastic, cervix - ENDOMETRIUM: ENDOMETRIOID ADENOCARCINOMA, 5.3 CM. - TUMOR FOCALLY INVADES DEEP MYOMETRIUM. - MARGINS NOT INVOLVED. - CERVIX, BILATERAL OVARIES AND BILATERAL FALLOPIAN TUBES FREE OF TUMOR.  Past/Anticipated interventions by Gyn/Onc surgery, if any: 02/24/17 - Procedure: XI ROBOTIC ASSISTED TOTAL HYSTERECTOMY WITH BILATERAL SALPINGO OOPHORECTOMY;  Surgeon: [name], MD  Past/Anticipated interventions by medical oncology, if any: none  Weight changes, if any: yes - has lost about 5 lbs.  Bowel/Bladder complaints, if any: reports having urinary frequency especially at night.  She denies having any bowel issues.  She does take benifiber for diverticulosis.  Nausea/Vomiting, if any: no  Pain issues, if any:  no  SAFETY ISSUES:  Prior radiation? no  Pacemaker/ICD? no  Possible current pregnancy? no  Is the patient on methotrexate? no  Current Complaints / other details:  Dr. Alycia Rossetti is recommending vaginal cuff brachytherapy.  BP 126/88 (BP Location: Right Arm, Patient Position: Sitting)   Pulse (!) 109   Temp 98.8 F (37.1 C) (Oral)   Ht 5\' 8"  (1.727 m)   Wt 149 lb 3.2 oz (67.7 kg)   SpO2 99%   BMI 22.69 kg/m    Wt Readings from Last 3 Encounters:  04/01/17 149 lb 3.2 oz (67.7 kg)  03/25/17 145 lb (65.8 kg)  02/24/17 153 lb (69.4 kg)

## 2017-04-01 ENCOUNTER — Telehealth: Payer: Self-pay

## 2017-04-01 ENCOUNTER — Ambulatory Visit
Admission: RE | Admit: 2017-04-01 | Discharge: 2017-04-01 | Disposition: A | Discharge status: Home / Self Care | Payer: Medicare Other | Source: Ambulatory Visit | Attending: Radiation Oncology | Admitting: Radiation Oncology

## 2017-04-01 ENCOUNTER — Encounter: Payer: Self-pay | Admitting: Radiation Oncology

## 2017-04-01 DIAGNOSIS — Z79899 Other long term (current) drug therapy: Secondary | ICD-10-CM | POA: Diagnosis not present

## 2017-04-01 DIAGNOSIS — C541 Malignant neoplasm of endometrium: Secondary | ICD-10-CM

## 2017-04-01 DIAGNOSIS — N950 Postmenopausal bleeding: Secondary | ICD-10-CM | POA: Insufficient documentation

## 2017-04-01 DIAGNOSIS — Z90710 Acquired absence of both cervix and uterus: Secondary | ICD-10-CM | POA: Diagnosis not present

## 2017-04-01 DIAGNOSIS — Z882 Allergy status to sulfonamides status: Secondary | ICD-10-CM | POA: Diagnosis not present

## 2017-04-01 DIAGNOSIS — Z9889 Other specified postprocedural states: Secondary | ICD-10-CM | POA: Diagnosis not present

## 2017-04-01 DIAGNOSIS — Z510 Encounter for antineoplastic radiation therapy: Secondary | ICD-10-CM | POA: Insufficient documentation

## 2017-04-01 DIAGNOSIS — C569 Malignant neoplasm of unspecified ovary: Secondary | ICD-10-CM | POA: Insufficient documentation

## 2017-04-01 DIAGNOSIS — Z800 Family history of malignant neoplasm of digestive organs: Secondary | ICD-10-CM | POA: Diagnosis not present

## 2017-04-01 HISTORY — DX: Malignant neoplasm of endometrium: C54.1

## 2017-04-01 NOTE — Telephone Encounter (Signed)
Sheila Armstrong is 5 weeks post op from hysterectomy. She noticed some spotting today.  She has been more active. Told her that Joylene John, NP said that this can happen up to 8 weeks post op as the sutures dissolve and she becomes mor active.  She is to call if the bleeding becomes more like a period.  Pt verbalized understanding.

## 2017-04-01 NOTE — Progress Notes (Signed)
Radiation Oncology         [phone] ________________________________  Initial Outpatient Consultation  Name: Sheila Armstrong MRN: [id number]  Date: 04/01/2017  DOB: 02/20/1935  CX:[id number], Sheila Rumps, NP  Sheila Marus, MD   REFERRING PHYSICIAN: [name], MD  DIAGNOSIS: TNM code: pT1b, pN0, FIGO Stage IB ENDOMETRIOID ADENOCARCINOMA   HISTORY OF PRESENT ILLNESS::Sheila Armstrong is a 82 y.o. female who is seen at the request of Dr. Alycia Armstrong. She initially presented to her PCP, Sheila Metz, FNP with complaints of postmenopausal vaginal bleeding since the summer of 2017; she occasionally had to wear a pad. She was subsequently referred to Dr. Fermin Armstrong for evaluation. Endometrial biopsy on 01/22/17 revealed endometrioid adenocarcinoma, grade 1, with background complex atypical hyperplasia and abundant necrotic tissue.  The patient underwent total hysterectomy with bilateral salpingo oophorectomy on 02/24/17. Surgical pathology revealed endometrioid adenocarcinoma spanning 5.3 cm. Margins not involved. Cervix, bilateral ovaries, and bilateral fallopian tubes were free of disease. Of the 2 lymph nodes sampled, both were benign. There was deep myometrial invasion; Myometrial invasion: 0.5 cm where myometrium is 0.9 cm in thickness.   On review of systems, the patient denies pain; she does note some discomfort related to scoliosis. She reports she has lost approximately 5 lbs. She complains of urinary frequency, especially at night. She denies bowel concerns. She notes she takes benifiber for diverticulosis. She denies nausea and vomiting. She denies numbness or weakness in bilateral lower extremities since surgery.   PREVIOUS RADIATION THERAPY: No  PAST MEDICAL HISTORY:  has a past medical history of Abnormal uterine bleeding; ALLERGIC RHINITIS (07/07/2007); COLONIC POLYPS, ADENOMATOUS, HX OF (11/19/2005); CONSTIPATION (12/31/2009); DIVERTICULOSIS, COLON (07/12/2008); Endometrial cancer  ([place]); Family history of colon cancer; Hearing loss; SEBORRHEIC KERATOSIS, INFLAMED (03/21/2008); and SENILE VAGINITIS (01/17/2008).    PAST SURGICAL HISTORY: Past Surgical History:  Procedure Laterality Date  . COSMETIC SURGERY    . DILATION AND CURETTAGE OF UTERUS    . EYELID IMPLANT     right and left  . RHINOPLASTY    . ROBOTIC ASSISTED TOTAL HYSTERECTOMY WITH BILATERAL SALPINGO OOPHERECTOMY Bilateral 02/24/2017   Procedure: XI ROBOTIC ASSISTED TOTAL HYSTERECTOMY WITH BILATERAL SALPINGO OOPHORECTOMY;  Surgeon: Sheila Marus, MD;  Location: [facility];  Service: Gynecology;  Laterality: Bilateral;  . SENTINEL NODE BIOPSY N/A 02/24/2017   Procedure: SENTINEL NODE BIOPSY;  Surgeon: Sheila Marus, MD;  Location: [facility];  Service: Gynecology;  Laterality: N/A;  . TONSILECTOMY/ADENOIDECTOMY WITH MYRINGOTOMY      FAMILY HISTORY: family history includes Anuerysm in her sister; Colon cancer (age of onset: 110) in her mother; Heart disease in her father.  SOCIAL HISTORY:  reports that she has never smoked. She has never used smokeless tobacco. She reports that she does not drink alcohol or use drugs.  ALLERGIES: Sulfonamide derivatives  MEDICATIONS:  Current Outpatient Prescriptions  Medication Sig Dispense Refill  . calcium-vitamin D (OSCAL WITH D) 500-200 MG-UNIT tablet Take 1 tablet by mouth daily with breakfast.    . cetirizine (ZYRTEC) 10 MG tablet Take 10 mg by mouth daily as needed for allergies.     Marland Kitchen EPINEPHrine 0.3 mg/0.3 mL IJ SOAJ injection INJ UTD PRF SYSTEMIC REACTIONS  0  . fluticasone (FLONASE) 50 MCG/ACT nasal spray Place 2 sprays into both nostrils daily as needed for allergies or rhinitis.    . Multiple Vitamin (MULTIVITAMIN) capsule Take 1 capsule by mouth daily.    . NON FORMULARY Allergy Injections- weekly    . Probiotic Product (ALIGN) 4 MG  CAPS Take 1 tablet by mouth daily. (Patient taking differently: Take 1 tablet by mouth daily as needed (stomach upset or diarrhea). )      No current facility-administered medications for this encounter.     REVIEW OF SYSTEMS:REVIEW OF SYSTEMS: A 10+ POINT REVIEW OF SYSTEMS WAS OBTAINED including neurology, dermatology, psychiatry, cardiac, respiratory, lymph, extremities, GI, GU, musculoskeletal, constitutional, reproductive, HEENT. All pertinent positives are noted in the HPI. All others are negative.   PHYSICAL EXAM:  height is 5\' 8"  (1.727 m) and weight is 149 lb 3.2 oz (67.7 kg). Her oral temperature is 98.8 F (37.1 C). Her blood pressure is 126/88 and her pulse is 109 (abnormal). Her oxygen saturation is 99%.   General: Alert and oriented, in no acute distress. HEENT: Extraocular movements are intact. PERRLA. Oropharynx is clear. Neck: Neck is supple, no palpable cervical or supraclavicular lymphadenopathy. Heart: Regular in rate and rhythm with no murmurs. Chest: Clear to auscultation bilaterally. Abdomen: Soft, nontender, nondistended, with no rigidity or guarding. Extremities: No edema. Lymphatics: see Neck Exam Skin: No concerning lesions. Musculoskeletal: symmetric strength and muscle tone throughout. Neurologic: No obvious focalities. Speech is fluent. Coordination is intact. Psychiatric: Judgment and insight are intact. Affect is appropriate.  Pelvic exam deferred until simulation and planning day.  ECOG = 1  LABORATORY DATA:  Lab Results  Component Value Date   WBC 13.9 (H) 02/25/2017   HGB 10.3 (L) 02/25/2017   HCT 30.6 (L) 02/25/2017   MCV 91.9 02/25/2017   PLT 185 02/25/2017   NEUTROABS 4.2 02/20/2017   Lab Results  Component Value Date   NA 134 (L) 02/25/2017   K 4.3 02/25/2017   CL 105 02/25/2017   CO2 22 02/25/2017   GLUCOSE 127 (H) 02/25/2017   CREATININE 0.76 02/25/2017   CALCIUM 8.8 (L) 02/25/2017    RADIOGRAPHY: No results found.    IMPRESSION: This is a 82 y.o. woman with recent diagnosis of endometrial cancer, status post total hysterectomy with bilateral salpingo  oophorectomy. Based on the patient's age and stage she would be at risk for vaginal cuff recurrence and would recommend adjuvant vaginal cuff radiation therapy as part of her overall management.  PLAN: Today, I talked to the patient about the findings and work-up thus far. We discussed the natural history of endometrial cancer and general treatment, highlighting the role of radiotherapy in the management.  We discussed the available radiation techniques, and focused on the details of logistics and delivery. I would recommend vaginal cuff brachytherapy for this patient.  We reviewed the anticipated acute and late sequelae associated with radiation in this setting. The patient was encouraged to ask questions that I answered to the best of my ability. The patient would like to proceed with radiation and will be scheduled for CT simulation. A consent form was discussed, signed, and placed in the patient's chart.  Radiation therapy to begin approximately 6 weeks postop. She will receive 5 intracavitary treatments with iridium 192 as the high-dose-rate source.  Nursing briefly discussed vaginal dilator use following RT. Printed information was provided for the patient's review. Patient has my business card.   ------------------------------------------------  Blair Promise, PhD, MD  This document serves as a record of services personally performed by Gery Pray, MD. It was created on his behalf by Maryla Morrow, a trained medical scribe. The creation of this record is based on the scribe's personal observations and the provider's statements to them. This document has been checked and approved by the  attending provider.

## 2017-04-01 NOTE — Progress Notes (Signed)
Please see the Nurse Progress Note in the MD Initial Consult Encounter for this patient. 

## 2017-04-02 ENCOUNTER — Telehealth: Payer: Self-pay | Admitting: *Deleted

## 2017-04-02 NOTE — Telephone Encounter (Signed)
CALLED PATIENT TO INFORM OF NEW HDR [facility], LVM FOR A RETURN CALL 

## 2017-04-07 ENCOUNTER — Ambulatory Visit
Admission: RE | Admit: 2017-04-07 | Discharge: 2017-04-07 | Disposition: A | Discharge status: Home / Self Care | Payer: Medicare Other | Source: Ambulatory Visit | Admitting: Radiation Oncology

## 2017-04-07 ENCOUNTER — Encounter: Payer: Self-pay | Admitting: Radiation Oncology

## 2017-04-07 ENCOUNTER — Ambulatory Visit: Admission: RE | Admit: 2017-04-07 | Payer: Medicare Other | Source: Ambulatory Visit | Admitting: Radiation Oncology

## 2017-04-07 ENCOUNTER — Ambulatory Visit
Admission: RE | Admit: 2017-04-07 | Discharge: 2017-04-07 | Disposition: A | Discharge status: Home / Self Care | Payer: Medicare Other | Source: Ambulatory Visit | Attending: Radiation Oncology | Admitting: Radiation Oncology

## 2017-04-07 DIAGNOSIS — Z510 Encounter for antineoplastic radiation therapy: Secondary | ICD-10-CM | POA: Diagnosis not present

## 2017-04-07 DIAGNOSIS — C541 Malignant neoplasm of endometrium: Secondary | ICD-10-CM

## 2017-04-07 NOTE — Progress Notes (Signed)
  Radiation Oncology         (336) [phone] ________________________________  Name: Sheila Armstrong MRN: [id number]  Date: 04/07/2017  DOB: 1935/03/28  Vaginal Brachytherapy Procedure Note  CC: Dorothyann Peng, NP [name], MD    ICD-9-CM ICD-10-CM   1. Endometrial cancer ([place]) 182.0 C54.1     Diagnosis: TNM code: pT1b, pN0, FIGO Stage IB ENDOMETRIOID ADENOCARCINOMA    Narrative: She returns today for vaginal cylinder fitting.   She denies having any pain or bladder/bowel issues.  She reports having some vaginal spotting but reports it is less than a few days ago.  ALLERGIES: is allergic to sulfonamide derivatives.  Meds: Current Outpatient Prescriptions  Medication Sig Dispense Refill  . calcium-vitamin D (OSCAL WITH D) 500-200 MG-UNIT tablet Take 1 tablet by mouth daily with breakfast.    . cetirizine (ZYRTEC) 10 MG tablet Take 10 mg by mouth daily as needed for allergies.     . fluticasone (FLONASE) 50 MCG/ACT nasal spray Place 2 sprays into both nostrils daily as needed for allergies or rhinitis.    . Multiple Vitamin (MULTIVITAMIN) capsule Take 1 capsule by mouth daily.    . NON FORMULARY Allergy Injections- weekly    . Probiotic Product (ALIGN) 4 MG CAPS Take 1 tablet by mouth daily. (Patient taking differently: Take 1 tablet by mouth daily as needed (stomach upset or diarrhea). )    . EPINEPHrine 0.3 mg/0.3 mL IJ SOAJ injection INJ UTD PRF SYSTEMIC REACTIONS  0   No current facility-administered medications for this encounter.     Physical Findings: The patient is in no acute distress. Patient is alert and oriented.  height is 5\' 8"  (1.727 m) and weight is 148 lb 9.6 oz (67.4 kg). Her oral temperature is 98.4 F (36.9 C). Her blood pressure is 117/67 and her pulse is 97. Her oxygen saturation is 100%.   No palpable cervical, supraclavicular or axillary lymphoadenopathy. The heart has a regular rate and rhythm. The lungs are clear to auscultation. Abdomen soft and  non-tender.  On pelvic examination the external genitalia were unremarkable. A speculum exam was performed. Vaginal cuff intact, no mucosal lesions. On bimanual exam there were no pelvic masses appreciated.  Lab Findings: Lab Results  Component Value Date   WBC 13.9 (H) 02/25/2017   HGB 10.3 (L) 02/25/2017   HCT 30.6 (L) 02/25/2017   MCV 91.9 02/25/2017   PLT 185 02/25/2017    Radiographic Findings: No results found. . Impression: FIGO Stage IB ENDOMETRIOID ADENOCARCINOMA   Patient was fitted for a vaginal cylinder. The patient will be treated with a 2.5 cm diameter cylinder with a treatment length of 4.0 cm. This distended the vaginal vault without undue discomfort. The patient tolerated the procedure well.  The patient was successfully fitted for a vaginal cylinder. The patient is appropriate to begin vaginal brachytherapy.   Plan: The patient will proceed with CT simulation and vaginal brachytherapy today.    _______________________________   Blair Promise, PhD, MD

## 2017-04-07 NOTE — Progress Notes (Signed)
  Radiation Oncology         [phone] ________________________________  Name: Sheila Armstrong MRN: [id number]  Date: 04/07/2017  DOB: 1935-03-25  CC: Dorothyann Peng, NP  [name], MD  HDR BRACHYTHERAPY NOTE  DIAGNOSIS: FIGO Stage IB ENDOMETRIOID ADENOCARCINOMA    Simple treatment device note: Patient had construction of her custom vaginal cylinder. She will be treated with a 2.5 cm diameter segmented cylinder. This conforms to her anatomy without undue discomfort.  Vaginal brachytherapy procedure node: The patient was brought to the [place] suite. Identity was confirmed. All relevant records and images related to the planned course of therapy were reviewed. The patient freely provided informed written consent to proceed with treatment after reviewing the details related to the planned course of therapy. The consent form was witnessed and verified by the simulation staff. Then, the patient was set-up in a stable reproducible supine position for radiation therapy. Pelvic exam revealed the vaginal cuff to be intact . The patient's custom vaginal cylinder was placed in the proximal vagina. This was affixed to the CT/MR stabilization plate to prevent slippage. Patient tolerated the placement well.  Verification simulation note:  A fiducial marker was placed within the vaginal cylinder. An AP and lateral film was then obtained through the pelvis area. This documented accurate position of the vaginal cylinder for treatment.  HDR BRACHYTHERAPY TREATMENT  The remote afterloading device was affixed to the vaginal cylinder by catheter. Patient then proceeded to undergo her first high-dose-rate treatment directed at the proximal vagina. The patient was prescribed a dose of 6 gray to be delivered to the mucosal surface. Treatment length was 4.0 cm. Patient was treated with 1 channel using 9 dwell positions. Treatment time was 234.9 seconds. Iridium 192 was the high-dose-rate source for treatment. The  patient tolerated the treatment well. After completion of her therapy, a radiation survey was performed documenting return of the iridium source into the [employer] safe.   PLAN: She will return next few days for her second high-dose-rate treatment. ________________________________  Blair Promise, PhD, MD

## 2017-04-07 NOTE — Progress Notes (Signed)
Sheila Armstrong is here for follow up/HDR.  She denies having any pain or bladder/bowel issues.  She reports having some vaginal spotting but reports it is less than a few days ago.  BP 117/67 (BP Location: Right Arm, Patient Position: Sitting)   Pulse 97   Temp 98.4 F (36.9 C) (Oral)   Ht 5\' 8"  (1.727 m)   Wt 148 lb 9.6 oz (67.4 kg)   SpO2 100%   BMI 22.59 kg/m    Wt Readings from Last 3 Encounters:  04/07/17 148 lb 9.6 oz (67.4 kg)  04/01/17 149 lb 3.2 oz (67.7 kg)  03/25/17 145 lb (65.8 kg)

## 2017-04-07 NOTE — Progress Notes (Signed)
  Radiation Oncology         [phone] ________________________________  Name: Sheila Armstrong MRN: [id number]  Date: 04/07/2017  DOB: 04/04/1935  SIMULATION AND TREATMENT PLANNING NOTE HDR BRACHYTHERAPY  DIAGNOSIS:  FIGO Stage IB ENDOMETRIOID ADENOCARCINOMA   NARRATIVE:  The patient was brought to the [place] suite.  Identity was confirmed.  All relevant records and images related to the planned course of therapy were reviewed.  The patient freely provided informed written consent to proceed with treatment after reviewing the details related to the planned course of therapy. The consent form was witnessed and verified by the simulation staff.  Then, the patient was set-up in a stable reproducible  supine position for radiation therapy.  CT images were obtained.  Surface markings were placed.  The CT images were loaded into the planning software.  Then the target and avoidance structures were contoured.  Treatment planning then occurred.  The radiation prescription was entered and confirmed.   I have requested : Brachytherapy Isodose Plan and Dosimetry Calculations to plan the radiation distribution.    PLAN:  The patient will receive 30 Gy in 5 fractions. Treatment will be with a 2.5 cm diameter cylinder with a treatment length of 4 cm. The patient will be treated with Iridium 192 as the high-dose-rate source.   ________________________________  Blair Promise, PhD, MD

## 2017-04-13 ENCOUNTER — Telehealth: Payer: Self-pay | Admitting: *Deleted

## 2017-04-13 NOTE — Telephone Encounter (Signed)
CALLED PATIENT TO REMIND OF HDR [place] 04-14-17 @ 10 AM, LVM FOR A RETURN CALL

## 2017-04-14 ENCOUNTER — Ambulatory Visit
Admission: RE | Admit: 2017-04-14 | Discharge: 2017-04-14 | Disposition: A | Discharge status: Home / Self Care | Payer: Medicare Other | Source: Ambulatory Visit | Attending: Radiation Oncology | Admitting: Radiation Oncology

## 2017-04-14 DIAGNOSIS — C541 Malignant neoplasm of endometrium: Secondary | ICD-10-CM

## 2017-04-14 DIAGNOSIS — Z510 Encounter for antineoplastic radiation therapy: Secondary | ICD-10-CM | POA: Diagnosis not present

## 2017-04-14 MED ORDER — PROCHLORPERAZINE MALEATE 10 MG PO TABS: 10.0000 mg | ORAL_TABLET | Freq: Four times a day (QID) | ORAL | 0 refills | Status: DC | PRN

## 2017-04-14 NOTE — Progress Notes (Signed)
  Radiation Oncology         (336) [phone] ________________________________  Name: ARYNN ARMAND MRN: [id number]  Date: 04/14/2017  DOB: Jan 27, 1935  CC: Dorothyann Peng, NP  [name], MD  HDR BRACHYTHERAPY NOTE  DIAGNOSIS: FIGO Stage IB ENDOMETRIOID ADENOCARCINOMA    Simple treatment device note: Patient had construction of her custom vaginal cylinder. She will be treated with a 2.5 cm diameter segmented cylinder. This conforms to her anatomy without undue discomfort.  Vaginal brachytherapy procedure node: The patient was brought to the [place] suite. Identity was confirmed. All relevant records and images related to the planned course of therapy were reviewed. The patient freely provided informed written consent to proceed with treatment after reviewing the details related to the planned course of therapy. The consent form was witnessed and verified by the simulation staff. Then, the patient was set-up in a stable reproducible supine position for radiation therapy. Pelvic exam revealed the vaginal cuff to be intact . The patient's custom vaginal cylinder was placed in the proximal vagina. This was affixed to the CT/MR stabilization plate to prevent slippage. Patient tolerated the placement well.  Verification simulation note:  A fiducial marker was placed within the vaginal cylinder. An AP and lateral film was then obtained through the pelvis area. This documented accurate position of the vaginal cylinder for treatment.  HDR BRACHYTHERAPY TREATMENT  The remote afterloading device was affixed to the vaginal cylinder by catheter. Patient then proceeded to undergo her second high-dose-rate treatment directed at the proximal vagina. The patient was prescribed a dose of 6.0 gray to be delivered to the mucosal surface. Treatment length was 4.0 cm. Patient was treated with 1 channel using 9 dwell positions. Treatment time was 250.8 seconds. Iridium 192 was the high-dose-rate source for treatment.  The patient tolerated the treatment well. After completion of her therapy, a radiation survey was performed documenting return of the iridium source into the [employer] safe.   PLAN: Patient will return next week for her third treatment. She did have some nausea after her previous treatment and I'll give her a prescription for Compazine to use as needed. ________________________________  Blair Promise, PhD, MD

## 2017-04-20 ENCOUNTER — Telehealth: Payer: Self-pay | Admitting: *Deleted

## 2017-04-20 NOTE — Telephone Encounter (Signed)
CALLED PATIENT TO REMIND OF HDR [place] 04-21-17 @ 10 AM, LVM FOR A RETURN CALL

## 2017-04-21 ENCOUNTER — Ambulatory Visit
Admission: RE | Admit: 2017-04-21 | Discharge: 2017-04-21 | Disposition: A | Discharge status: Home / Self Care | Payer: Medicare Other | Source: Ambulatory Visit | Attending: Radiation Oncology | Admitting: Radiation Oncology

## 2017-04-21 DIAGNOSIS — C541 Malignant neoplasm of endometrium: Secondary | ICD-10-CM

## 2017-04-21 DIAGNOSIS — Z510 Encounter for antineoplastic radiation therapy: Secondary | ICD-10-CM | POA: Diagnosis not present

## 2017-04-21 NOTE — Progress Notes (Signed)
  Radiation Oncology         [phone] ________________________________  Name: Sheila Armstrong MRN: [id number]  Date: 04/21/2017  DOB: 1935/07/19  CC: Dorothyann Peng, NP  [name], MD  HDR BRACHYTHERAPY NOTE  DIAGNOSIS: FIGO Stage IB ENDOMETRIOID ADENOCARCINOMA    Simple treatment device note: Patient had construction of her custom vaginal cylinder. She will be treated with a 2.5 cm diameter segmented cylinder. This conforms to her anatomy without undue discomfort.  Vaginal brachytherapy procedure node: The patient was brought to the [place] suite. Identity was confirmed. All relevant records and images related to the planned course of therapy were reviewed. The patient freely provided informed written consent to proceed with treatment after reviewing the details related to the planned course of therapy. The consent form was witnessed and verified by the simulation staff. Then, the patient was set-up in a stable reproducible supine position for radiation therapy. Pelvic exam revealed the vaginal cuff to be intact . The patient's custom vaginal cylinder was placed in the proximal vagina. This was affixed to the CT/MR stabilization plate to prevent slippage. Patient tolerated the placement well.  Verification simulation note:  A fiducial marker was placed within the vaginal cylinder. An AP and lateral film was then obtained through the pelvis area. This documented accurate position of the vaginal cylinder for treatment.  HDR BRACHYTHERAPY TREATMENT  The remote afterloading device was affixed to the vaginal cylinder by catheter. Patient then proceeded to undergo her third high-dose-rate treatment directed at the proximal vagina. The patient was prescribed a dose of 6.0 gray to be delivered to the mucosal surface. Treatment length was 4.0 cm. Patient was treated with 1 channel using 9 dwell positions. Treatment time was 267.7 seconds. Iridium 192 was the high-dose-rate source for treatment.  The patient tolerated the treatment well. After completion of her therapy, a radiation survey was performed documenting return of the iridium source into the [employer] safe.   PLAN: the patient will return next week for her fourth and fifth high-dose-rate treatment. ________________________________  Blair Promise, PhD, MD

## 2017-04-27 ENCOUNTER — Telehealth: Payer: Self-pay | Admitting: *Deleted

## 2017-04-27 NOTE — Telephone Encounter (Signed)
Called patient to remind of HDR Tx. For 04-28-17 @ 10 am,  lvm for a return call

## 2017-04-28 ENCOUNTER — Ambulatory Visit
Admission: RE | Admit: 2017-04-28 | Discharge: 2017-04-28 | Disposition: A | Discharge status: Home / Self Care | Payer: Medicare Other | Source: Ambulatory Visit | Attending: Radiation Oncology | Admitting: Radiation Oncology

## 2017-04-28 DIAGNOSIS — C541 Malignant neoplasm of endometrium: Secondary | ICD-10-CM

## 2017-04-28 DIAGNOSIS — Z510 Encounter for antineoplastic radiation therapy: Secondary | ICD-10-CM | POA: Diagnosis not present

## 2017-04-28 NOTE — Progress Notes (Signed)
  Radiation Oncology         ([phone]) [phone] ________________________________  Name: HONESTEE REVARD MRN: [id number]  Date: 04/28/2017  DOB: 11/25/1934  CC: Dorothyann Peng, NP  [name], MD  HDR BRACHYTHERAPY NOTE  DIAGNOSIS: FIGO Stage IB ENDOMETRIOID ADENOCARCINOMA    Simple treatment device note: Patient had construction of her custom vaginal cylinder. She will be treated with a 2.5 cm diameter segmented cylinder. This conforms to her anatomy without undue discomfort.  Vaginal brachytherapy procedure node: The patient was brought to the [place] [facility]. Identity was confirmed. All relevant records and images related to the planned course of therapy were reviewed. The patient freely provided informed written consent to proceed with treatment after reviewing the details related to the planned course of therapy. The consent form was witnessed and verified by the simulation staff. Then, the patient was set-up in a stable reproducible supine position for radiation therapy. Pelvic exam revealed the vaginal cuff to be intact . The patient's custom vaginal cylinder was placed in the proximal vagina. This was affixed to the CT/MR stabilization plate to prevent slippage. Patient tolerated the placement well.  Verification simulation note:  A fiducial marker was placed within the vaginal cylinder. An AP and lateral film was then obtained through the pelvis area. This documented accurate position of the vaginal cylinder for treatment.  HDR BRACHYTHERAPY TREATMENT  The remote afterloading device was affixed to the vaginal cylinder by catheter. Patient then proceeded to undergo her 4th high-dose-rate treatment directed at the proximal vagina. The patient was prescribed a dose of 6.0 gray to be delivered to the mucosal surface. Treatment length was 4.0 cm. Patient was treated with 1 channel using 9 dwell positions. Treatment time was 286 seconds. Iridium 192 was the high-dose-rate source for treatment. The  patient tolerated the treatment well. After completion of her therapy, a radiation survey was performed documenting return of the iridium source into the [employer] safe.   PLAN: Patient will return in 2 days for her final treatment. ________________________________  Blair Promise, PhD, MD

## 2017-04-29 ENCOUNTER — Telehealth: Payer: Self-pay | Admitting: *Deleted

## 2017-04-29 NOTE — Telephone Encounter (Signed)
Called patient to remind of HDR Tx. For 04-30-17 @ 1 pm, spoke with patient and she is aware of this tx.

## 2017-04-30 ENCOUNTER — Ambulatory Visit
Admission: RE | Admit: 2017-04-30 | Discharge: 2017-04-30 | Disposition: A | Discharge status: Home / Self Care | Payer: Medicare Other | Source: Ambulatory Visit | Attending: Radiation Oncology | Admitting: Radiation Oncology

## 2017-04-30 DIAGNOSIS — Z510 Encounter for antineoplastic radiation therapy: Secondary | ICD-10-CM | POA: Diagnosis not present

## 2017-04-30 DIAGNOSIS — C541 Malignant neoplasm of endometrium: Secondary | ICD-10-CM

## 2017-04-30 NOTE — Progress Notes (Signed)
  Radiation Oncology         [phone]) [phone] ________________________________  Name: [name] MRN: [id number]  Date: 04/30/2017  DOB: 1935-01-04  CC: Sheila Peng, NP  [name], MD  HDR BRACHYTHERAPY NOTE  DIAGNOSIS: FIGO Stage IB ENDOMETRIOID ADENOCARCINOMA    Simple treatment device note: Patient had construction of her custom vaginal cylinder. She will be treated with a 2.5 cm diameter segmented cylinder. This conforms to her anatomy without undue discomfort.  Vaginal brachytherapy procedure node: The patient was brought to the [facility]. Identity was confirmed. All relevant records and images related to the planned course of therapy were reviewed. The patient freely provided informed written consent to proceed with treatment after reviewing the details related to the planned course of therapy. The consent form was witnessed and verified by the simulation staff. Then, the patient was set-up in a stable reproducible supine position for radiation therapy. Pelvic exam revealed the vaginal cuff to be intact . The patient's custom vaginal cylinder was placed in the proximal vagina. This was affixed to the CT/MR stabilization plate to prevent slippage. Patient tolerated the placement well.  Verification simulation note:  A fiducial marker was placed within the vaginal cylinder. An AP and lateral film was then obtained through the pelvis area. This documented accurate position of the vaginal cylinder for treatment.  HDR BRACHYTHERAPY TREATMENT  The remote afterloading device was affixed to the vaginal cylinder by catheter. Patient then proceeded to undergo her 5th high-dose-rate treatment directed at the proximal vagina. The patient was prescribed a dose of 6.0 gray to be delivered to the mucosal surface. Treatment length was 4.0 cm. Patient was treated with 1 channel using 9 dwell positions. Treatment time was 291.4 seconds. Iridium 192 was the high-dose-rate source for treatment. The  patient tolerated the treatment well. After completion of her therapy, a radiation survey was performed documenting return of the iridium source into the GammaMed safe.   PLAN: Patient has completed brachytherapy treatment. She will return for f/u in 1 month. ________________________________  Blair Promise, PhD, MD   This document serves as a record of services personally performed by Gery Pray, MD. It was created on his behalf by Linward Natal, a trained medical scribe. The creation of this record is based on the scribe's personal observations and the provider's statements to them. This document has been checked and approved by the attending provider.

## 2017-05-11 ENCOUNTER — Encounter: Payer: Self-pay | Admitting: Radiation Oncology

## 2017-05-11 NOTE — Progress Notes (Signed)
  Radiation Oncology         [phone] ________________________________  Name: Sheila Armstrong MRN: [id number]  Date: 05/11/2017  DOB: 11/22/1934  End of Treatment Note  Diagnosis:   pT1b, pN0, FIGO Stage IB Endometriod Adenocarcinoma   Indication for treatment:  Curative       Radiation treatment dates:   04/07/17, 04/14/17, 04/21/17, 04/28/17, 04/30/17  Site/dose:   Vaginal Cuff treated to 30 Gy in 5 fractions  Beams/energy:   HDR Ir-192 Vaginal,  Treatment was treated with a 2.5 cm diameter cylinder with a treatment length of 4 cm. prescription was to the mucosal surface  Narrative: The patient tolerated radiation treatment relatively well. Overall the patient was without complaint.  Plan: The patient has completed radiation treatment. The patient will return to radiation oncology clinic for routine followup in one month. I advised them to call or return sooner if they have any questions or concerns related to their recovery or treatment.  -----------------------------------  Blair Promise, PhD, MD  This document serves as a record of services personally performed by Gery Pray, MD. It was created on his behalf by Maryla Morrow, a trained medical scribe. The creation of this record is based on the scribe's personal observations and the provider's statements to them. This document has been checked and approved by the attending provider.

## 2017-05-28 ENCOUNTER — Other Ambulatory Visit: Payer: Self-pay | Admitting: Adult Health

## 2017-05-28 ENCOUNTER — Other Ambulatory Visit: Payer: Self-pay | Admitting: Family Medicine

## 2017-05-28 DIAGNOSIS — Z1231 Encounter for screening mammogram for malignant neoplasm of breast: Secondary | ICD-10-CM

## 2017-05-29 ENCOUNTER — Encounter: Payer: Self-pay | Admitting: Oncology

## 2017-06-01 ENCOUNTER — Ambulatory Visit
Admission: RE | Admit: 2017-06-01 | Discharge: 2017-06-01 | Disposition: A | Discharge status: Home / Self Care | Payer: Medicare Other | Source: Ambulatory Visit | Attending: Radiation Oncology | Admitting: Radiation Oncology

## 2017-06-01 ENCOUNTER — Encounter: Payer: Self-pay | Admitting: Radiation Oncology

## 2017-06-01 DIAGNOSIS — C541 Malignant neoplasm of endometrium: Secondary | ICD-10-CM | POA: Diagnosis present

## 2017-06-01 DIAGNOSIS — Z882 Allergy status to sulfonamides status: Secondary | ICD-10-CM | POA: Insufficient documentation

## 2017-06-01 DIAGNOSIS — Z923 Personal history of irradiation: Secondary | ICD-10-CM | POA: Insufficient documentation

## 2017-06-01 NOTE — Progress Notes (Signed)
Rickell is here for follow up.  She denies having any pain, bladder/bowel issues or vaginal bleeding/discharge.  She reports her energy level has improved and is almost back to normal.  She has been given a size S and S+ vaginal dilator and was instructed on how to use them.  BP 125/69 (BP Location: Left Arm, Patient Position: Sitting)   Pulse 74   Temp 97.7 F (36.5 C) (Oral)   Ht 5\' 8"  (1.727 m)   Wt 151 lb 6.4 oz (68.7 kg)   SpO2 100%   BMI 23.02 kg/m    Wt Readings from Last 3 Encounters:  06/01/17 151 lb 6.4 oz (68.7 kg)  04/07/17 148 lb 9.6 oz (67.4 kg)  04/01/17 149 lb 3.2 oz (67.7 kg)

## 2017-06-01 NOTE — Progress Notes (Signed)
Radiation Oncology         (336) [phone] ________________________________  Name: LENETTA PICHE MRN: [id number]  Date: 06/01/2017  DOB: [birth year]  Follow-Up Visit Note  CC: Dorothyann Peng, NP  [name], MD    ICD-10-CM   1. Endometrial cancer ([place]) C54.1     Diagnosis:   pT1b, pN0, FIGO Stage IB Endometriod Adenocarcinoma   Interval Since Last Radiation:  1 months, 2 days  Narrative:  Sheila Armstrong returns today for routine follow-up after completing radiation therapy. The patient was treated in the vaginal cuff with 30 Gy in 5 fractions with HDR Ir-192 vaginal. Today, she denies having any pain, bladder/bowel issues or vaginal bleeding/discharge.  She reports her energy level has improved and is almost back to normal.  She has been given a size S and S+ vaginal dilator and was instructed on how to use them.                        ALLERGIES:  is allergic to sulfonamide derivatives.  Meds: Current Outpatient Prescriptions  Medication Sig Dispense Refill  . calcium-vitamin D (OSCAL WITH D) 500-200 MG-UNIT tablet Take 1 tablet by mouth daily with breakfast.    . cetirizine (ZYRTEC) 10 MG tablet Take 10 mg by mouth daily as needed for allergies.     Marland Kitchen EPINEPHrine 0.3 mg/0.3 mL IJ SOAJ injection INJ UTD PRF SYSTEMIC REACTIONS  0  . fluticasone (FLONASE) 50 MCG/ACT nasal spray Place 2 sprays into both nostrils daily as needed for allergies or rhinitis.    . Multiple Vitamin (MULTIVITAMIN) capsule Take 1 capsule by mouth daily.    . NON FORMULARY Allergy Injections- weekly    . Probiotic Product (ALIGN) 4 MG CAPS Take 1 tablet by mouth daily. (Patient taking differently: Take 1 tablet by mouth daily as needed (stomach upset or diarrhea). )    . prochlorperazine (COMPAZINE) 10 MG tablet Take 1 tablet (10 mg total) by mouth every 6 (six) hours as needed for nausea or vomiting. (Patient not taking: Reported on 06/01/2017) 30 tablet 0   No current facility-administered medications for this  encounter.     Physical Findings: The patient is in no acute distress. Patient is alert and oriented.  height is 5\' 8"  (1.727 m) and weight is 151 lb 6.4 oz (68.7 kg). Her oral temperature is 97.7 F (36.5 C). Her blood pressure is 125/69 and her pulse is 74. Her oxygen saturation is 100%. .   General: Alert and oriented, in no acute distress HEENT: Head is normocephalic. Extraocular movements are intact. Oropharynx is clear. Neck: Neck is supple, no palpable cervical or supraclavicular lymphadenopathy. Heart: Regular in rate and rhythm with no murmurs, rubs, or gallops. Chest: Clear to auscultation bilaterally, with no rhonchi, wheezes, or rales. Abdomen: Soft, nontender, nondistended, with no rigidity or guarding. Pelvic exam deferred in light of recent completion of treatment  Lab Findings: Lab Results  Component Value Date   WBC 13.9 (H) 02/25/2017   HGB 10.3 (L) 02/25/2017   HCT 30.6 (L) 02/25/2017   MCV 91.9 02/25/2017   PLT 185 02/25/2017    Radiographic Findings: No results found.  Impression: Patient is doing well at this time. The patient was given a size S and S+ vaginal dilator and was instructed on how to use them.   Plan: Patient will meet Dr. Alycia Rossetti in late November and follow up with myself in early March.  -----------------------------------  Blair Promise, PhD, MD  This document serves as a record of services personally performed by Gery Pray, MD. It was created on his behalf by Valeta Harms, a trained medical scribe. The creation of this record is based on the scribe's personal observations and the provider's statements to them. This document has been checked and approved by the attending provider.

## 2017-06-01 NOTE — Progress Notes (Signed)
  Home Care Instructions for the Insertion and Care of Your Vaginal Dilator  Why Do I Need a Vaginal Dilator?  Internal radiation therapy may cause scar tissue to form at the top of your vagina (vaginal cuff).  This may make vaginal examinations difficult in the future. You can prevent scar tissue from forming by using a vaginal dilator (a smooth plastic rod), and/or by having regular sexual intercourse.  If not using the dilator you should be having intercourse two or three times a week.  If you are unable to have intercourse, you should use your vaginal dilator.  You may have some spotting or bleeding from your dilator or intercourse the first few times. You may also have some discomfort. If discomfort occurs with intercourse, you and your partner may need to stop for a while and try again later.  How to Use Your Vaginal Dilator  - Wash the dilator with soap and water before and after each use. - Check the dilator to be sure it is smooth. Do not use the dilator if you find any roughspots. - Coat the dilator with K-Y Jelly, Astroglide, or Replens. Do not use Vaseline, baby oil, or other oil based lubricants. They are not water-soluble and can be irritating to the tissues in the vagina. - Lie on your back with your knees bent and legs apart. - Insert the rounded end of the dilator into your vagina as far as it will go without causing pain or discomfort. - Close your knees and slowly straighten your legs. - Keep the dilator in your vagina for about 10 to 15 minutes.  Please use it 3 times a week (for example, Monday, Wednesday and Friday). [facility] your knees, open your legs, and gently remove the dilator. - Gently cleanse the skin around the vaginal opening. - Wash the dilator after each use. -  It is important that you use the dilator routinely until instructed otherwise by your doctor.

## 2017-07-02 ENCOUNTER — Ambulatory Visit
Admission: RE | Admit: 2017-07-02 | Discharge: 2017-07-02 | Disposition: A | Discharge status: Home / Self Care | Payer: Medicare Other | Source: Ambulatory Visit | Attending: Adult Health | Admitting: Adult Health

## 2017-07-02 DIAGNOSIS — Z1231 Encounter for screening mammogram for malignant neoplasm of breast: Secondary | ICD-10-CM

## 2017-09-02 ENCOUNTER — Ambulatory Visit: Payer: Medicare Other | Admitting: Gynecologic Oncology

## 2017-10-06 NOTE — Progress Notes (Signed)
Consult Note: Gyn-Onc  Sheila Armstrong 82 y.o. female  CC:  Chief Complaint  Patient presents with  . Endometrial ca [facility])    HPI:  Referring physician Dr. Sumner Boast   82 year old with intermittent vaginal bleeding (postmenopausal) since the summer of 2017. This was recently evaluated with an endometrial biopsy revealing a grade 1 endometrial carcinoma. The patient has no other symptoms. She specifically denies any pelvic pain or pressure. Her only prior pelvic surgery is that of a tubal ligation. She was initially seen by Dr. Fermin Schwab in consultation however I performed her surgery.  She has no family history of gynecologic or breast cancers. She has no other significant health morbidities  ENCOUNTER DATE: 02/24/17 Surgery: Total robotic hysterectomy bilateral salpingo-oophorectomy, injection cervix for sentinel lymph node dissection, bilateral sentinel lymph node removal Pathology: Uterus, cervix, bilateral tubes and ovaries, right and left external iliac sentinel lymph nodes. Operative findings: Flush cervix with vagina, normal uterus and bilateral adnexa, redundant RS colon, bilateral external iliac sentinel lymph nodes.  Specimens: 1) Uterus, cervix, bilateral adnexa 2) right and left external iliac sentinel lymph nodes  Diagnosis 1. Lymph node, sentinel, biopsy, right external iliac - ONE BENIGN LYMPH NODE (0/1). 2. Lymph node, sentinel, biopsy, left external iliac - ONE BENIGN LYMPH NODE (0/1). 3. Uterus +/- tubes/ovaries, neoplastic, cervix - ENDOMETRIUM: ENDOMETRIOID ADENOCARCINOMA, 5.3 CM. - TUMOR FOCALLY INVADES DEEP MYOMETRIUM. - MARGINS NOT INVOLVED. - CERVIX, BILATERAL OVARIES AND BILATERAL FALLOPIAN TUBES FREE OF TUMOR.  Interval History:  I last saw her 5/18 for her post op check. She was dispositioned to vaginal cuff brachytherapy which she completed 04/30/17. She comes in today for follow up. She's overall doing very well she states very physically  active. She walks about 03-5999 steps per day. She is using her vaginal dilator. She denies any new medical problems and her family.  Review of Systems  Constitutional: Denies fever. Skin: No rash Cardiovascular: No chest pain, shortness of breath, or edema  Pulmonary: No cough Gastro Intestinal: No nausea, vomiting, constipation, or diarrhea reported.  Genitourinary: Denies vaginal bleeding and discharge.  Musculoskeletal: No joint swelling or pain.  Neurologic: No change in gait.  Psychology: No complaints  Current Meds:  Outpatient Encounter Medications as of 10/07/2017  Medication Sig  . calcium-vitamin D (OSCAL WITH D) 500-200 MG-UNIT tablet Take 1 tablet by mouth daily with breakfast.  . cetirizine (ZYRTEC) 10 MG tablet Take 10 mg by mouth daily as needed for allergies.   Marland Kitchen EPINEPHrine 0.3 mg/0.3 mL IJ SOAJ injection INJ UTD PRF SYSTEMIC REACTIONS  . fluticasone (FLONASE) 50 MCG/ACT nasal spray Place 2 sprays into both nostrils daily as needed for allergies or rhinitis.  . Multiple Vitamin (MULTIVITAMIN) capsule Take 1 capsule by mouth daily.  . NON FORMULARY Allergy Injections- weekly  . Probiotic Product (ALIGN) 4 MG CAPS Take 1 tablet by mouth daily. (Patient taking differently: Take 1 tablet by mouth daily as needed (stomach upset or diarrhea). )  . prochlorperazine (COMPAZINE) 10 MG tablet Take 1 tablet (10 mg total) by mouth every 6 (six) hours as needed for nausea or vomiting.   No facility-administered encounter medications on file as of 10/07/2017.     Allergy:  Allergies  Allergen Reactions  . Sulfonamide Derivatives Hives    Social Hx:   Social History   Socioeconomic History  . Marital status: Married    Spouse name: Not on file  . Number of children: 2  . Years of education: Not  on file  . Highest education level: Not on file  Social Needs  . Financial resource strain: Not on file  . Food insecurity - worry: Not on file  . Food insecurity - inability:  Not on file  . Transportation needs - medical: Not on file  . Transportation needs - non-medical: Not on file  Occupational History  . Occupation: Engineer, technical sales at [employer]  . Smoking status: Never Smoker  . Smokeless tobacco: Never Used  Substance and Sexual Activity  . Alcohol use: No  . Drug use: No  . Sexual activity: No    Partners: Male  Other Topics Concern  . 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 COSMETIC SURGERY    . DILATION AND CURETTAGE OF UTERUS    . EYELID IMPLANT     right and left  . RHINOPLASTY    . ROBOTIC ASSISTED TOTAL HYSTERECTOMY WITH BILATERAL SALPINGO OOPHERECTOMY Bilateral 02/24/2017   Procedure: XI ROBOTIC ASSISTED TOTAL HYSTERECTOMY WITH BILATERAL SALPINGO OOPHORECTOMY;  Surgeon: [name], MD;  Location: [facility];  Service: Gynecology;  Laterality: Bilateral;  . SENTINEL NODE BIOPSY N/A 02/24/2017   Procedure: SENTINEL NODE BIOPSY;  Surgeon: [name], MD;  Location: [facility];  Service: Gynecology;  Laterality: N/A;  . TONSILECTOMY/ADENOIDECTOMY WITH MYRINGOTOMY      Past Medical Hx:  Past Medical History:  Diagnosis Date  . Abnormal uterine bleeding   . ALLERGIC RHINITIS 07/07/2007  . COLONIC POLYPS, ADENOMATOUS, HX OF 11/19/2005  . CONSTIPATION 12/31/2009  . DIVERTICULOSIS, COLON 07/12/2008  . Endometrial cancer ([place])   . Family history of colon cancer   . Hearing loss   . History of radiation therapy 04/07/17-04/30/17   viaginal cuff treated to 30 Gy in 5 fractions  . SEBORRHEIC KERATOSIS, INFLAMED 03/21/2008  . SENILE VAGINITIS 01/17/2008    Oncology Hx:   No history exists.    Family Hx:  Family History  Problem Relation Age of Onset  . Colon cancer Mother 67  . Heart disease Father   . Anuerysm Sister     Vitals:   Please refer to epic  Physical Exam: Well-nourished well-developed female in no acute distress.  Neck : Supple, no lymphadenopathy, no thyromegaly.  Lungs: Clear to auscultation bilaterally.  Cardiovascular: Regular rate and rhythm.  Abdomen: Well-healed surgical incisions. Abdomen is soft, nontender, nondistended. There are no incisional hernias. Well-healed surgical incisions.  Groins: No lymphadenopathy.  Extremities: No edema.  Pelvic: External genitalia within normal limits. Vagina is atrophic. The vaginal cuff is without lesions or discharge. Bimanual examination reveals no fluctuance, masses, tenderness. Rectal confirms.   Assessment/Plan: 82 year old with a stage IB grade 1 endometrioid adenocarcinoma with no lymphovascular space invasion and negative sentinel lymph nodes She underwent definitive surgery 02/24/17. Based on her age and depth of invasion she qualified for vaginal cuff brachytherapy which she completed under the care of Dr. Sondra Come on 04/30/17.  She has no evidence of recurrent disease today. She will follow-up with Dr. Sondra Come in 3 months and return to see [place] in 6 months. We will proceed with a Pap smear at that time.   Melenie Minniear A., MD 10/07/2017, 9:57 AM

## 2017-10-07 ENCOUNTER — Ambulatory Visit: Payer: Medicare Other | Admitting: Gynecologic Oncology

## 2017-10-07 ENCOUNTER — Ambulatory Visit: Payer: Medicare Other | Attending: Gynecologic Oncology | Admitting: Gynecologic Oncology

## 2017-10-07 ENCOUNTER — Encounter: Payer: Self-pay | Admitting: Gynecologic Oncology

## 2017-10-07 VITALS — BP 130/66 | HR 80 | Temp 97.8°F | Resp 18 | Ht 68.0 in | Wt 150.6 lb

## 2017-10-07 DIAGNOSIS — Z923 Personal history of irradiation: Secondary | ICD-10-CM | POA: Diagnosis not present

## 2017-10-07 DIAGNOSIS — Z79899 Other long term (current) drug therapy: Secondary | ICD-10-CM | POA: Insufficient documentation

## 2017-10-07 DIAGNOSIS — Z8542 Personal history of malignant neoplasm of other parts of uterus: Secondary | ICD-10-CM

## 2017-10-07 DIAGNOSIS — Z882 Allergy status to sulfonamides status: Secondary | ICD-10-CM | POA: Insufficient documentation

## 2017-10-07 DIAGNOSIS — C541 Malignant neoplasm of endometrium: Secondary | ICD-10-CM | POA: Diagnosis not present

## 2017-10-07 DIAGNOSIS — Z800 Family history of malignant neoplasm of digestive organs: Secondary | ICD-10-CM | POA: Diagnosis not present

## 2017-10-07 NOTE — Patient Instructions (Signed)
Follow-up as scheduled with Dr. Sondra Come in 3 months and return to see [place] in GYN oncology in 6 months. We will proceed with a Pap smear at the time of her next visit.

## 2018-01-11 ENCOUNTER — Ambulatory Visit
Admission: RE | Admit: 2018-01-11 | Discharge: 2018-01-11 | Disposition: A | Discharge status: Home / Self Care | Payer: Medicare Other | Source: Ambulatory Visit | Attending: Radiation Oncology | Admitting: Radiation Oncology

## 2018-01-11 ENCOUNTER — Telehealth: Payer: Self-pay | Admitting: *Deleted

## 2018-01-11 ENCOUNTER — Encounter: Payer: Self-pay | Admitting: Radiation Oncology

## 2018-01-11 ENCOUNTER — Other Ambulatory Visit: Payer: Self-pay

## 2018-01-11 ENCOUNTER — Telehealth: Payer: Self-pay | Admitting: Obstetrics and Gynecology

## 2018-01-11 VITALS — BP 117/68 | HR 83 | Temp 97.6°F | Resp 18 | Wt 154.0 lb

## 2018-01-11 DIAGNOSIS — C541 Malignant neoplasm of endometrium: Secondary | ICD-10-CM

## 2018-01-11 DIAGNOSIS — Z882 Allergy status to sulfonamides status: Secondary | ICD-10-CM | POA: Insufficient documentation

## 2018-01-11 DIAGNOSIS — Z923 Personal history of irradiation: Secondary | ICD-10-CM | POA: Diagnosis not present

## 2018-01-11 NOTE — Progress Notes (Signed)
Radiation Oncology         [phone] ________________________________  Name: Sheila Armstrong MRN: [id number]  Date: 01/11/2018  DOB: 09-Nov-1935  Follow-Up Visit Note  CC: Salvadore Dom, MD  [name], MD    ICD-10-CM   1. Endometrial cancer ([place]) C54.1     Diagnosis:   pT1b, pN0, FIGO Stage IB Endometriod Adenocarcinoma   Interval Since Last Radiation:  8 months, 11 days  Radiation treatment dates:   04/07/17, 04/14/17, 04/21/17, 04/28/17, 04/30/17  Site/dose:   Vaginal Cuff treated to 30 Gy in 5 fractions  Narrative:  Jing returns today for routine follow-up after completing radiation therapy. Since her last visit to the office, she followed up with Dr. Alycia Rossetti on 10/07/2017 and will follow up in 6 months with a pap smear. She is still using her vaginal dilator without issues three times a week. She denies any new medical issues since her last visit.   On review of systems, she denies pain, fatigue, bladder/bowel issues, vaginal bleeding/discharge, rectal bleeding, nausea, vomiting, and any other symptoms.                        ALLERGIES:  is allergic to sulfonamide derivatives.  Meds: Current Outpatient Medications  Medication Sig Dispense Refill  . calcium-vitamin D (OSCAL WITH D) 500-200 MG-UNIT tablet Take 1 tablet by mouth daily with breakfast.    . cetirizine (ZYRTEC) 10 MG tablet Take 10 mg by mouth daily as needed for allergies.     Marland Kitchen EPINEPHrine 0.3 mg/0.3 mL IJ SOAJ injection INJ UTD PRF SYSTEMIC REACTIONS  0  . fluticasone (FLONASE) 50 MCG/ACT nasal spray Place 2 sprays into both nostrils daily as needed for allergies or rhinitis.    . hydrocortisone 2.5 % ointment APPLY ON THE LIPS BID  1  . Multiple Vitamin (MULTIVITAMIN) capsule Take 1 capsule by mouth daily.    . NON FORMULARY Allergy Injections- weekly    . Probiotic Product (ALIGN) 4 MG CAPS Take 1 tablet by mouth daily. (Patient taking differently: Take 1 tablet by mouth daily as needed  (stomach upset or diarrhea). )    . prochlorperazine (COMPAZINE) 10 MG tablet Take 1 tablet (10 mg total) by mouth every 6 (six) hours as needed for nausea or vomiting. (Patient not taking: Reported on 01/11/2018) 30 tablet 0   No current facility-administered medications for this encounter.     Physical Findings:  The patient is in no acute distress. Patient is alert and oriented.  weight is 154 lb (69.9 kg). Her oral temperature is 97.6 F (36.4 C). Her blood pressure is 117/68 and her pulse is 83. Her respiration is 18 and oxygen saturation is 100%. .    Lungs are clear to auscultation bilaterally. Heart has regular rate and rhythm. No palpable cervical, supraclavicular, or axillary adenopathy. Abdomen soft, non-tender, normal bowel sounds. On pelvic examination the external genitalia were unremarkable. A speculum exam was performed. There are no mucosal lesions noted in the vaginal vault. On bimanual and rectovaginal examination there were no pelvic masses appreciated.    Lab Findings: Lab Results  Component Value Date   WBC 13.9 (H) 02/25/2017   HGB 10.3 (L) 02/25/2017   HCT 30.6 (L) 02/25/2017   MCV 91.9 02/25/2017   PLT 185 02/25/2017    Radiographic Findings: No results found.  Impression: No evidence of recurrence on exam.    Plan: Patient will follow up with GYN- Onc in 3 months  and Radiation Oncology in 6 months.    -----------------------------------  Blair Promise, PhD, MD  This document serves as a record of services personally performed by Gery Pray, MD. It was created on his behalf by [facility], a trained medical scribe. The creation of this record is based on the scribe's personal observations and the provider's statements to them. This document has been checked and approved by the attending provider.

## 2018-01-11 NOTE — Telephone Encounter (Signed)
You can take her out of the recall system. Oncology will send her back when she is ready

## 2018-01-11 NOTE — Telephone Encounter (Signed)
Patient called and cancelled her AEX for 01/13/18 with Dr. Talbert Nan. She said the [facility] is taking care of her right now for her annual exam needs. Routing to provider, please advise on recall date for our office?

## 2018-01-11 NOTE — Telephone Encounter (Signed)
Returned Shirley's call from radiation, scheduled the follow up appt on June 6th at 1:15pm.

## 2018-01-11 NOTE — Telephone Encounter (Signed)
CALLED PATIENT TO INFORM OF FU WITH DR. Denman George ON 04-15-18 @ 1:15 PM, SPOKE WITH PATIENT AND SHE IS AWARE OF THIS APPT.

## 2018-01-11 NOTE — Progress Notes (Signed)
Sheila Armstrong is here for her f/u appointment. Denies any pain or fatigue. Patient denies any issues with her bladder or bowels.Denies any vaginal bleeding or discharge. Denies any rectal bleeding. Denies any nausea or vomiting. Patient states that she is using her dilator three times a week. Vitals:   01/11/18 1108  BP: 117/68  Pulse: 83  Resp: 18  Temp: 97.6 F (36.4 C)  TempSrc: Oral  SpO2: 100%  Weight: 154 lb (69.9 kg)   Wt Readings from Last 3 Encounters:  01/11/18 154 lb (69.9 kg)  10/07/17 150 lb 9.6 oz (68.3 kg)  06/01/17 151 lb 6.4 oz (68.7 kg)

## 2018-01-28 ENCOUNTER — Ambulatory Visit: Payer: Medicare Other | Admitting: Obstetrics and Gynecology

## 2018-02-03 ENCOUNTER — Telehealth: Payer: Self-pay | Admitting: *Deleted

## 2018-02-03 NOTE — Telephone Encounter (Signed)
Called and left the patient a message to call the office back.  

## 2018-02-04 ENCOUNTER — Telehealth: Payer: Self-pay | Admitting: *Deleted

## 2018-02-04 NOTE — Telephone Encounter (Signed)
Returned the patient's call and moved appt from June 6th to June 7th

## 2018-04-15 ENCOUNTER — Ambulatory Visit: Payer: Medicare Other | Admitting: Gynecologic Oncology

## 2018-04-16 ENCOUNTER — Encounter: Payer: Self-pay | Admitting: Gynecology

## 2018-04-16 ENCOUNTER — Inpatient Hospital Stay: Payer: Medicare Other | Attending: Gynecologic Oncology | Admitting: Gynecology

## 2018-04-16 ENCOUNTER — Other Ambulatory Visit (HOSPITAL_COMMUNITY)
Admission: RE | Admit: 2018-04-16 | Discharge: 2018-04-16 | Disposition: A | Discharge status: Home / Self Care | Payer: Medicare Other | Source: Ambulatory Visit | Attending: Gynecologic Oncology | Admitting: Gynecologic Oncology

## 2018-04-16 VITALS — BP 125/65 | HR 78 | Temp 98.1°F | Resp 18 | Ht 68.0 in | Wt 152.3 lb

## 2018-04-16 DIAGNOSIS — C541 Malignant neoplasm of endometrium: Secondary | ICD-10-CM | POA: Diagnosis present

## 2018-04-16 DIAGNOSIS — Z90722 Acquired absence of ovaries, bilateral: Secondary | ICD-10-CM

## 2018-04-16 DIAGNOSIS — Z8542 Personal history of malignant neoplasm of other parts of uterus: Secondary | ICD-10-CM

## 2018-04-16 DIAGNOSIS — Z90711 Acquired absence of uterus with remaining cervical stump: Secondary | ICD-10-CM | POA: Diagnosis not present

## 2018-04-16 DIAGNOSIS — Z923 Personal history of irradiation: Secondary | ICD-10-CM | POA: Diagnosis not present

## 2018-04-16 DIAGNOSIS — Z08 Encounter for follow-up examination after completed treatment for malignant neoplasm: Secondary | ICD-10-CM | POA: Diagnosis not present

## 2018-04-16 DIAGNOSIS — Z90710 Acquired absence of both cervix and uterus: Secondary | ICD-10-CM | POA: Insufficient documentation

## 2018-04-16 NOTE — Patient Instructions (Signed)
We will release the results of your pap smear to you in mychart.  Plan to follow up in six months or sooner if needed.  Please call for any new symptoms, questions, or concerns.

## 2018-04-16 NOTE — Progress Notes (Signed)
Consult Note: Gyn-Onc   Sheila Armstrong 83 y.o. female  Chief Complaint  Patient presents with  . Endometrial cancer [facility])    Assessment : Stage Ib grade 1 endometrial adenocarcinoma April 2018.  Clinically free of disease.  Plan: Pap smear is obtained today.  The patient return to see Dr. Sondra Come in 3 months return to see [place] in 6 months.  Interval History: Patient returns today as previously scheduled for follow-up.  Since her last visit she is done well.  She specifically denies any health problems.  She is has no GI GU or pelvic symptoms.  Her functional status is excellent.   HPI: Patient initially presented with vaginal bleeding since December 2017.  Endometrial biopsy revealed a grade 1 endometrial cancer.  Patient underwent total robotic hysterectomy, bilateral salpingo-oophorectomy, and sentinel lymph nodes on February 24, 2017.  Final pathology showed a large lesion measuring 5.3 cm focally invades deep into the myometrium.  Cervix tubes ovaries and lymph nodes were free of disease.  The patient had postoperative vaginal cuff radiation therapy.    Review of Systems:10 point review of systems is negative except as noted in interval history.   Vitals: Blood pressure 125/65, pulse 78, temperature 98.1 F (36.7 C), temperature source Oral, resp. rate 18, height 5\' 8"  (1.727 m), weight 152 lb 4.8 oz (69.1 kg), SpO2 100 %.  Physical Exam: General : The patient is a healthy woman in no acute distress.  HEENT: normocephalic, extraoccular movements normal; neck is supple without thyromegally  Lynphnodes: Supraclavicular and inguinal nodes not enlarged  Abdomen: Soft, non-tender, no ascites, no organomegally, no masses, no hernias  Pelvic:  EGBUS: Normal female  Vagina: Normal, no lesions, cuff is well-healed Urethra and Bladder: Normal, non-tender  Cervix: Surgically absent  Uterus: Surgically absent  Bi-manual examination: Non-tender; no adenxal masses or nodularity  Rectal:  normal sphincter tone, no masses, no blood  Lower extremities: No edema or varicosities. Normal range of motion      Allergies  Allergen Reactions  . Sulfonamide Derivatives Hives    Past Medical History:  Diagnosis Date  . Abnormal uterine bleeding   . ALLERGIC RHINITIS 07/07/2007  . COLONIC POLYPS, ADENOMATOUS, HX OF 11/19/2005  . CONSTIPATION 12/31/2009  . DIVERTICULOSIS, COLON 07/12/2008  . Endometrial cancer ([place])   . Family history of colon cancer   . Hearing loss   . History of radiation therapy 04/07/17-04/30/17   viaginal cuff treated to 30 Gy in 5 fractions  . SEBORRHEIC KERATOSIS, INFLAMED 03/21/2008  . SENILE VAGINITIS 01/17/2008    Past Surgical History:  Procedure Laterality Date  . COSMETIC SURGERY    . DILATION AND CURETTAGE OF UTERUS    . EYELID IMPLANT     right and left  . RHINOPLASTY    . ROBOTIC ASSISTED TOTAL HYSTERECTOMY WITH BILATERAL SALPINGO OOPHERECTOMY Bilateral 02/24/2017   Procedure: XI ROBOTIC ASSISTED TOTAL HYSTERECTOMY WITH BILATERAL SALPINGO OOPHORECTOMY;  Surgeon: [name], MD;  Location: [facility];  Service: Gynecology;  Laterality: Bilateral;  . SENTINEL NODE BIOPSY N/A 02/24/2017   Procedure: SENTINEL NODE BIOPSY;  Surgeon: [name], MD;  Location: [facility];  Service: Gynecology;  Laterality: N/A;  . TONSILECTOMY/ADENOIDECTOMY WITH MYRINGOTOMY      Current Outpatient Medications  Medication Sig Dispense Refill  . calcium-vitamin D (OSCAL WITH D) 500-200 MG-UNIT tablet Take 1 tablet by mouth daily with breakfast.    . cetirizine (ZYRTEC) 10 MG tablet Take 10 mg by mouth daily as needed for allergies.     Marland Kitchen  EPINEPHrine 0.3 mg/0.3 mL IJ SOAJ injection INJ UTD PRF SYSTEMIC REACTIONS  0  . fluticasone (FLONASE) 50 MCG/ACT nasal spray Place 2 sprays into both nostrils daily as needed for allergies or rhinitis.    . hydrocortisone 2.5 % ointment APPLY ON THE LIPS BID  1  . Multiple Vitamin (MULTIVITAMIN) capsule Take 1 capsule by mouth daily.     . NON FORMULARY Allergy Injections- weekly    . Probiotic Product (ALIGN) 4 MG CAPS Take 1 tablet by mouth daily. (Patient taking differently: Take 1 tablet by mouth daily as needed (stomach upset or diarrhea). )     No current facility-administered medications for this visit.     Social History   Socioeconomic History  . Marital status: Married    Spouse name: Not on file  . Number of children: 2  . Years of education: Not on file  . Highest education level: Not on file  Occupational History  . Occupation: Engineer, technical sales at [employer]  . Financial resource strain: Not on file  . Food insecurity:    Worry: Not on file    Inability: Not on file  . Transportation needs:    Medical: Not on file    Non-medical: Not on file  Tobacco Use  . Smoking status: Never Smoker  . Smokeless tobacco: Never Used  Substance and Sexual Activity  . Alcohol use: No  . Drug use: No  . Sexual activity: Never    Partners: Male  Lifestyle  . Physical activity:    Days per week: Not on file    Minutes per session: Not on file  . Stress: Not on file  Relationships  . Social connections:    Talks on phone: Not on file    Gets together: Not on file    Attends religious service: Not on file    Active member of club or organization: Not on file    Attends meetings of clubs or organizations: Not on file    Relationship status: Not on file  . Intimate partner violence:    Fear of current or ex partner: Not on file    Emotionally abused: Not on file    Physically abused: Not on file    Forced sexual activity: Not on file  Other Topics Concern  . Not on file  Social History Narrative   Married for 9 years    Two children ( both live locally)    Retired - [employer]       She likes to travel     Family History  Problem Relation Age of Onset  . Colon cancer Mother 69  . Heart disease Father   . Anuerysm Sister       Marti Sleigh, MD 04/16/2018,  1:42 PM

## 2018-04-23 LAB — CYTOLOGY - PAP: DIAGNOSIS: NEGATIVE

## 2018-05-20 ENCOUNTER — Telehealth: Payer: Self-pay | Admitting: *Deleted

## 2018-05-20 DIAGNOSIS — Z1239 Encounter for other screening for malignant neoplasm of breast: Secondary | ICD-10-CM

## 2018-05-20 NOTE — Telephone Encounter (Signed)
Copied from [place] [id number]. Topic: Referral - Request >> May 20, 2018 10:24 AM Sheila Armstrong wrote:  Pt call to request a referral be placed for her to have her Mammogram to the breast center

## 2018-05-20 NOTE — Telephone Encounter (Signed)
Orders placed as directed

## 2018-05-20 NOTE — Telephone Encounter (Signed)
She has not seen you since January 2018 but has been following closely w/ oncology for endometrial CA in the interim.  Okay to place orders for MMG? She is due in late August.

## 2018-05-20 NOTE — Telephone Encounter (Signed)
That is fine. Thanks 

## 2018-06-11 ENCOUNTER — Ambulatory Visit: Payer: Medicare Other | Admitting: Gastroenterology

## 2018-06-11 ENCOUNTER — Encounter: Payer: Self-pay | Admitting: Gastroenterology

## 2018-06-11 VITALS — BP 120/60 | HR 108 | Ht 67.25 in | Wt 152.4 lb

## 2018-06-11 DIAGNOSIS — Z800 Family history of malignant neoplasm of digestive organs: Secondary | ICD-10-CM | POA: Diagnosis not present

## 2018-06-11 MED ORDER — PEG-KCL-NACL-NASULF-NA ASC-C 140 G PO SOLR: 140.0000 g | ORAL | 0 refills | Status: DC

## 2018-06-11 NOTE — Patient Instructions (Signed)
If you are age 81 or older, your body mass index should be between 23-30. Your Body mass index is 23.69 kg/m. If this is out of the aforementioned range listed, please consider follow up with your Primary Care Provider.  If you are age 44 or younger, your body mass index should be between 19-25. Your Body mass index is 23.69 kg/m. If this is out of the aformentioned range listed, please consider follow up with your Primary Care Provider.   You have been scheduled for a colonoscopy. Please follow written instructions given to you at your visit today.  Please pick up your prep supplies at the pharmacy within the next 1-3 days. If you use inhalers (even only as needed), please bring them with you on the day of your procedure. Your physician has requested that you go to www.startemmi.com and enter the access code given to you at your visit today. This web site gives a general overview about your procedure. However, you should still follow specific instructions given to you by our office regarding your preparation for the procedure.  It was a pleasure to meet you today!  Dr. Loletha Carrow

## 2018-06-11 NOTE — Progress Notes (Signed)
[facility] Consult Note:  History: Sheila Armstrong 06/11/2018  Referring physician: Dorothyann Peng, NP  Reason for consult/chief complaint: hx of colon polyps (to discuss need for colon)   Subjective  HPI:   Recent history from Dr. Fermin Schwab of gynecologic oncology: " Patient initially presented with vaginal bleeding since December 2017. Endometrial biopsy revealed a grade 1 endometrial cancer. Patient underwent total robotic hysterectomy, bilateral salpingo-oophorectomy, and sentinel lymph nodes on February 24, 2017. Final pathology showed a large lesion measuring 5.3 cm focally invades deep into the myometrium. Cervix tubes ovaries and lymph nodes were free of disease. The patient had postoperative vaginal cuff radiation therapy. "  Colonoscopy by Dr. Sharlett Iles in September 2009, diverticulosis and no polyps. Office visit in 2013 for diverticulitis.  Colonoscopy September 2014, diverticulosis and no polyps.  Sheila Armstrong came to discuss possible screening colonoscopy due to family history of colon cancer in her mother.  She has been feeling well, with no abdominal pain, altered bowel habits or rectal bleeding.  She denies dysphagia, heartburn, nausea, vomiting or weight loss.  She is quite active traveling, quilting and seeing friends. Her gynecologist but she should talk with [place] about the possibility of a colonoscopy.  ROS:  Review of Systems  Constitutional: Negative for appetite change and unexpected weight change.  HENT: Negative for mouth sores and voice change.   Eyes: Negative for pain and redness.  Respiratory: Negative for cough and shortness of breath.   Cardiovascular: Negative for chest pain and palpitations.  Genitourinary: Negative for dysuria and hematuria.  Musculoskeletal: Positive for arthralgias. Negative for myalgias.  Skin: Negative for pallor and rash.  Neurological: Negative for weakness and headaches.  Hematological: Negative for  adenopathy.     Past Medical History: Past Medical History:  Diagnosis Date  . Abnormal uterine bleeding   . ALLERGIC RHINITIS 07/07/2007  . COLONIC POLYPS, ADENOMATOUS, HX OF 11/19/2005  . CONSTIPATION 12/31/2009  . DIVERTICULOSIS, COLON 07/12/2008  . Endometrial cancer ([place])   . Family history of colon cancer   . Hearing loss   . History of radiation therapy 04/07/17-04/30/17   viaginal cuff treated to 30 Gy in 5 fractions  . SEBORRHEIC KERATOSIS, INFLAMED 03/21/2008  . SENILE VAGINITIS 01/17/2008     Past Surgical History: Past Surgical History:  Procedure Laterality Date  . COSMETIC SURGERY    . DILATION AND CURETTAGE OF UTERUS    . EYELID IMPLANT     right and left  . RHINOPLASTY    . ROBOTIC ASSISTED TOTAL HYSTERECTOMY WITH BILATERAL SALPINGO OOPHERECTOMY Bilateral 02/24/2017   Procedure: XI ROBOTIC ASSISTED TOTAL HYSTERECTOMY WITH BILATERAL SALPINGO OOPHORECTOMY;  Surgeon: [name], MD;  [facility];  Service: Gynecology;  Laterality: Bilateral;  . SENTINEL NODE BIOPSY N/A 02/24/2017   Procedure: SENTINEL NODE BIOPSY;  Surgeon: [name], MD;  Location: [facility];  Service: Gynecology;  Laterality: N/A;  . TONSILECTOMY/ADENOIDECTOMY WITH MYRINGOTOMY       Family History: Family History  Problem Relation Age of Onset  . Colon cancer Mother 13  . Heart disease Father   . Anuerysm Sister     Social History: Social History   Socioeconomic History  . Marital status: Married    Spouse name: Not on file  . Number of children: 2  . Years of education: Not on file  . Highest education level: Not on file  Occupational History  . Occupation: Engineer, technical sales at [employer]  . Financial resource strain: Not on file  .  Food insecurity:    Worry: Not on file    Inability: Not on file  . Transportation needs:    Medical: Not on file    Non-medical: Not on file  Tobacco Use  . Smoking status: Never Smoker  . Smokeless tobacco: Never Used    Substance and Sexual Activity  . Alcohol use: No  . Drug use: No  . Sexual activity: Never    Partners: Male  Lifestyle  . Physical activity:    Days per week: Not on file    Minutes per session: Not on file  . Stress: Not on file  Relationships  . Social connections:    Talks on phone: Not on file    Gets together: Not on file    Attends religious service: Not on file    Active member of club or organization: Not on file    Attends meetings of clubs or organizations: Not on file    Relationship status: Not on file  Other Topics Concern  . Not on file  Social History Narrative   Married for 9 years    Two children ( both live locally)    Retired - [employer]       She likes to travel     Allergies: Allergies  Allergen Reactions  . Sulfonamide Derivatives Hives    Outpatient Meds: Current Outpatient Medications  Medication Sig Dispense Refill  . calcium-vitamin D (OSCAL WITH D) 500-200 MG-UNIT tablet Take 1 tablet by mouth daily with breakfast.    . cetirizine (ZYRTEC) 10 MG tablet Take 10 mg by mouth daily as needed for allergies.     . fluticasone (FLONASE) 50 MCG/ACT nasal spray Place 2 sprays into both nostrils daily as needed for allergies or rhinitis.    . hydrocortisone 2.5 % ointment APPLY ON THE LIPS BID  1  . Multiple Vitamin (MULTIVITAMIN) capsule Take 1 capsule by mouth daily.    . NON FORMULARY Allergy Injections- weekly    . Probiotic Product (ALIGN) 4 MG CAPS Take 1 tablet by mouth daily. (Patient taking differently: Take 1 tablet by mouth daily as needed (stomach upset or diarrhea). )    . EPINEPHrine 0.3 mg/0.3 mL IJ SOAJ injection INJ UTD PRF SYSTEMIC REACTIONS  0  . PEG-KCl-NaCl-NaSulf-Na Asc-C (PLENVU) 140 g SOLR Take 140 g by mouth as directed. 1 each 0   No current facility-administered medications for this visit.       ___________________________________________________________________ Objective   Exam:  BP 120/60 (BP  Location: Left Arm, Patient Position: Sitting, Cuff Size: Normal)   Pulse (!) 108   Ht 5' 7.25" (1.708 m) Comment: height measured without shoes  Wt 152 lb 6 oz (69.1 kg)   BMI 23.69 kg/m    General: this is a(n) healthy and well-appearing woman  Eyes: sclera anicteric, no redness  ENT: oral mucosa moist without lesions, no cervical or supraclavicular lymphadenopathy, good dentition  CV: RRR without murmur, S1/S2, no JVD, no peripheral edema  Resp: clear to auscultation bilaterally, normal RR and effort noted  GI: soft, no tenderness, with active bowel sounds. No guarding or palpable organomegaly noted.  Skin; warm and dry, no rash or jaundice noted  Neuro: awake, alert and oriented x 3. Normal gross motor function and fluent speech   Assessment: Encounter Diagnosis  Name Primary?  . Family history of colon cancer in mother Yes    Given her family history and her overall excellent health, I advised her to  have a screening colonoscopy.  She is agreeable after discussion of procedure and risks.  I expect this will be her last colonoscopy.   Thank you for the courtesy of this consult.  Please call me with any questions or concerns.  Nelida Meuse III  CC: Dorothyann Peng, NP

## 2018-06-16 ENCOUNTER — Ambulatory Visit (AMBULATORY_SURGERY_CENTER): Payer: Medicare Other | Admitting: Gastroenterology

## 2018-06-16 ENCOUNTER — Encounter: Payer: Self-pay | Admitting: Gastroenterology

## 2018-06-16 VITALS — BP 121/67 | HR 79 | Temp 97.1°F | Resp 17 | Ht 67.0 in | Wt 152.0 lb

## 2018-06-16 DIAGNOSIS — Z1211 Encounter for screening for malignant neoplasm of colon: Secondary | ICD-10-CM

## 2018-06-16 DIAGNOSIS — Z800 Family history of malignant neoplasm of digestive organs: Secondary | ICD-10-CM | POA: Diagnosis not present

## 2018-06-16 MED ORDER — SODIUM CHLORIDE 0.9 % IV SOLN: 500.0000 mL | Freq: Once | INTRAVENOUS | Status: DC

## 2018-06-16 NOTE — Patient Instructions (Signed)
**   Handout given on diverticulosis **   YOU HAD AN ENDOSCOPIC PROCEDURE TODAY AT THE [facility]:   Refer to the procedure report that was given to you for any specific questions about what was found during the examination.  If the procedure report does not answer your questions, please call your gastroenterologist to clarify.  If you requested that your care partner not be given the details of your procedure findings, then the procedure report has been included in a sealed envelope for you to review at your convenience later.  YOU SHOULD EXPECT: Some feelings of bloating in the abdomen. Passage of more gas than usual.  Walking can help get rid of the air that was put into your GI tract during the procedure and reduce the bloating. If you had a lower endoscopy (such as a colonoscopy or flexible sigmoidoscopy) you may notice spotting of blood in your stool or on the toilet paper. If you underwent a bowel prep for your procedure, you may not have a normal bowel movement for a few days.  Please Note:  You might notice some irritation and congestion in your nose or some drainage.  This is from the oxygen used during your procedure.  There is no need for concern and it should clear up in a day or so.  SYMPTOMS TO REPORT IMMEDIATELY:   Following lower endoscopy (colonoscopy or flexible sigmoidoscopy):  Excessive amounts of blood in the stool  Significant tenderness or worsening of abdominal pains  Swelling of the abdomen that is new, acute  Fever of 100F or higher  For urgent or emergent issues, a gastroenterologist can be reached at any hour by calling [phone].   DIET:  We do recommend a small meal at first, but then you may proceed to your regular diet.  Drink plenty of fluids but you should avoid alcoholic beverages for 24 hours.  ACTIVITY:  You should plan to take it easy for the rest of today and you should NOT DRIVE or use heavy machinery until tomorrow (because of the  sedation medicines used during the test).    FOLLOW UP: Our staff will call the number listed on your records the next business day following your procedure to check on you and address any questions or concerns that you may have regarding the information given to you following your procedure. If we do not reach you, we will leave a message.  However, if you are feeling well and you are not experiencing any problems, there is no need to return our call.  We will assume that you have returned to your regular daily activities without incident.  If any biopsies were taken you will be contacted by phone or by letter within the next 1-3 weeks.  Please call us at [phone] if you have not heard about the biopsies in 3 weeks.    SIGNATURES/CONFIDENTIALITY: You and/or your care partner have signed paperwork which will be entered into your electronic medical record.  These signatures attest to the fact that that the information above on your After Visit Summary has been reviewed and is understood.  Full responsibility of the confidentiality of this discharge information lies with you and/or your care-partner. 

## 2018-06-16 NOTE — Progress Notes (Signed)
Pt's states no medical or surgical changes since previsit or office visit. 

## 2018-06-16 NOTE — Op Note (Signed)
[facility] Patient Name: Sheila Armstrong Procedure Date: 06/16/2018 8:30 AM MRN: [id number] Endoscopist: Mallie Mussel L. Loletha Carrow , MD Age: 83 Referring MD:  Date of Birth: 1935/03/07 Gender: Female Account #: [id number] Procedure:                Colonoscopy Indications:              Colon cancer screening in patient at increased                            risk: Colorectal cancer in mother Medicines:                Monitored Anesthesia Care Procedure:                Pre-Anesthesia Assessment:                           - Prior to the procedure, a History and Physical                            was performed, and patient medications and                            allergies were reviewed. The patient's tolerance of                            previous anesthesia was also reviewed. The risks                            and benefits of the procedure and the sedation                            options and risks were discussed with the patient.                            All questions were answered, and informed consent                            was obtained. Prior Anticoagulants: The patient has                            taken no previous anticoagulant or antiplatelet                            agents. ASA Grade Assessment: II - A patient with                            mild systemic disease. After reviewing the risks                            and benefits, the patient was deemed in                            satisfactory condition to undergo the procedure.  After obtaining informed consent, the colonoscope                            was passed under direct vision. Throughout the                            procedure, the patient's blood pressure, pulse, and                            oxygen saturations were monitored continuously. The                            Colonoscope was introduced through the anus and                            advanced to the the cecum,  identified by                            appendiceal orifice and ileocecal valve. The                            colonoscopy was performed without difficulty. The                            patient tolerated the procedure well. The quality                            of the bowel preparation was excellent. The                            ileocecal valve, appendiceal orifice, and rectum                            were photographed. Scope In: 8:37:03 AM Scope Out: 8:48:45 AM Scope Withdrawal Time: 0 hours 8 minutes 3 seconds  Total Procedure Duration: 0 hours 11 minutes 42 seconds  Findings:                 The perianal and digital rectal examinations were                            normal.                           Many diverticula were found in the left colon.                           The exam was otherwise without abnormality on                            direct and retroflexion views. Complications:            No immediate complications. Estimated Blood Loss:     Estimated blood loss: none. Impression:               - Diverticulosis in the left colon.                           -  The examination was otherwise normal on direct                            and retroflexion views.                           - No specimens collected. Recommendation:           - Patient has a contact number available for                            emergencies. The signs and symptoms of potential                            delayed complications were discussed with the                            patient. Return to normal activities tomorrow.                            Written discharge instructions were provided to the                            patient.                           - Resume previous diet.                           - Continue present medications.                           - No repeat screening colonoscopy due to age. Henry L. Loletha Carrow, MD 06/16/2018 8:53:41 AM This report has been signed  electronically.

## 2018-06-16 NOTE — Progress Notes (Signed)
A and O x3. Report to RN. Tolerated MAC anesthesia well.

## 2018-06-17 ENCOUNTER — Telehealth: Payer: Self-pay

## 2018-06-17 NOTE — Telephone Encounter (Signed)
Attempted to reach patient for post-procedure f/u call. No answer. Left message that we will make another attempt to reach her again later today and to please not hesitate to call us if she has any questions/concerns regarding her care. 

## 2018-06-17 NOTE — Telephone Encounter (Signed)
  Follow up Call-  Call Sheila Armstrong number 06/16/2018  Post procedure Call Sincere Liuzzi phone  # [phone]  Permission to leave phone message Yes  Some recent data might be hidden     Patient questions:  Do you have a fever, pain , or abdominal swelling? No. Pain Score  0 *  Have you tolerated food without any problems? Yes.    Have you been able to return to your normal activities? Yes.    Do you have any questions about your discharge instructions: Diet   No. Medications  No. Follow up visit  No.  Do you have questions or concerns about your Care? No.  Actions: * If pain score is 4 or above: No action needed, pain <4.

## 2018-07-05 ENCOUNTER — Ambulatory Visit
Admission: RE | Admit: 2018-07-05 | Discharge: 2018-07-05 | Disposition: A | Discharge status: Home / Self Care | Payer: Medicare Other | Source: Ambulatory Visit | Attending: Adult Health | Admitting: Adult Health

## 2018-07-05 DIAGNOSIS — Z1239 Encounter for other screening for malignant neoplasm of breast: Secondary | ICD-10-CM

## 2018-07-13 DIAGNOSIS — H903 Sensorineural hearing loss, bilateral: Secondary | ICD-10-CM | POA: Insufficient documentation

## 2018-07-13 DIAGNOSIS — H6121 Impacted cerumen, right ear: Secondary | ICD-10-CM | POA: Insufficient documentation

## 2018-07-15 ENCOUNTER — Ambulatory Visit
Admission: RE | Admit: 2018-07-15 | Discharge: 2018-07-15 | Disposition: A | Discharge status: Home / Self Care | Payer: Medicare Other | Source: Ambulatory Visit | Attending: Radiation Oncology | Admitting: Radiation Oncology

## 2018-07-15 ENCOUNTER — Other Ambulatory Visit: Payer: Self-pay

## 2018-07-15 ENCOUNTER — Encounter: Payer: Self-pay | Admitting: Radiation Oncology

## 2018-07-15 VITALS — BP 126/78 | HR 74 | Temp 98.4°F | Resp 20 | Ht 67.0 in | Wt 152.2 lb

## 2018-07-15 DIAGNOSIS — Z8542 Personal history of malignant neoplasm of other parts of uterus: Secondary | ICD-10-CM | POA: Diagnosis not present

## 2018-07-15 DIAGNOSIS — Z08 Encounter for follow-up examination after completed treatment for malignant neoplasm: Secondary | ICD-10-CM | POA: Diagnosis not present

## 2018-07-15 DIAGNOSIS — C541 Malignant neoplasm of endometrium: Secondary | ICD-10-CM

## 2018-07-15 NOTE — Progress Notes (Signed)
Pt presents today for f/u with Dr. Sondra Come. Pt is unaccompanied. Pt denies dysuria/hematuria. Pt denies vaginal bleeding/discharge. Pt denies rectal, diarrhea/constipation. Pt denies nausea/vomiting.   BP 126/78 (BP Location: Right Arm, Patient Position: Sitting)   Pulse 74   Temp 98.4 F (36.9 C) (Oral)   Resp 20   Ht 5\' 7"  (1.702 m)   Wt 152 lb 3.2 oz (69 kg)   SpO2 99%   BMI 23.84 kg/m    Wt Readings from Last 3 Encounters:  07/15/18 152 lb 3.2 oz (69 kg)  06/16/18 152 lb (68.9 kg)  06/11/18 152 lb 6 oz (69.1 kg)   Loma Sousa, RN BSN

## 2018-07-15 NOTE — Progress Notes (Signed)
Radiation Oncology         [phone] ________________________________  Name: Sheila Armstrong MRN: [id number]  Date: 07/15/2018  DOB: [birth year]  Follow-Up Visit Note  CC: Dorothyann Peng, NP  [name], MD    ICD-10-CM   1. Endometrial cancer ([place]) C54.1     Diagnosis:   pT1b, pN0, FIGO Stage IB Endometriod Adenocarcinoma   Interval Since Last Radiation:  1 year and 2.5 months  Radiation treatment dates:   04/07/17 - 04/30/17  Site/dose:   Vaginal Cuff treated to 30 Gy in 5 fractions  Narrative:  Sheila Armstrong returns today for routine follow-up. She reports she is still using her vaginal dilator and denies bleeding with use. She recently saw Dr. Fermin Schwab on 04/16/18. A Pap smear was obtained and was negative.  On review of systems, she denies pain, fatigue, bladder/bowel issues, vaginal bleeding/discharge, rectal bleeding, nausea, vomiting, and any other symptoms.                       ALLERGIES:  is allergic to sulfonamide derivatives.  Meds: Current Outpatient Medications  Medication Sig Dispense Refill  . calcium-vitamin D (OSCAL WITH D) 500-200 MG-UNIT tablet Take 1 tablet by mouth daily with breakfast.    . cetirizine (ZYRTEC) 10 MG tablet Take 10 mg by mouth daily as needed for allergies.     Marland Kitchen EPINEPHrine 0.3 mg/0.3 mL IJ SOAJ injection INJ UTD PRF SYSTEMIC REACTIONS  0  . fluticasone (FLONASE) 50 MCG/ACT nasal spray Place 2 sprays into both nostrils daily as needed for allergies or rhinitis.    . hydrocortisone 2.5 % ointment APPLY ON THE LIPS BID  1  . Multiple Vitamin (MULTIVITAMIN) capsule Take 1 capsule by mouth daily.    . NON FORMULARY Allergy Injections- weekly    . Probiotic Product (ALIGN) 4 MG CAPS Take 1 tablet by mouth daily.     No current facility-administered medications for this encounter.    Review of Systems: A 10+ POINT REVIEW OF SYSTEMS WAS OBTAINED including neurology, dermatology, psychiatry, cardiac, respiratory, lymph, extremities, GI,  GU, musculoskeletal, constitutional, reproductive, HEENT. All pertinent positives are noted in the HPI. All others are negative.  Physical Findings:  The patient is in no acute distress. Patient is alert and oriented.  height is 5\' 7"  (1.702 m) and weight is 152 lb 3.2 oz (69 kg). Her oral temperature is 98.4 F (36.9 C). Her blood pressure is 126/78 and her pulse is 74. Her respiration is 20 and oxygen saturation is 99%. .   Lungs are clear to auscultation bilaterally. Heart has regular rate and rhythm. No palpable cervical, supraclavicular, or axillary adenopathy. Abdomen soft, non-tender, normal bowel sounds.  On pelvic examination the external genitalia were unremarkable. A speculum exam was performed. There are no mucosal lesions noted in the vaginal vault. On bimanual and rectovaginal examination there were no pelvic masses appreciated.  Lab Findings: Lab Results  Component Value Date   WBC 13.9 (H) 02/25/2017   HGB 10.3 (L) 02/25/2017   HCT 30.6 (L) 02/25/2017   MCV 91.9 02/25/2017   PLT 185 02/25/2017    Radiographic Findings: Mm Digital Screening  Result Date: 07/05/2018 CLINICAL DATA:  Screening. EXAM: DIGITAL SCREENING BILATERAL MAMMOGRAM WITH CAD COMPARISON:  Previous exam(s). ACR Breast Density Category c: The breast tissue is heterogeneously dense, which may obscure small masses. FINDINGS: There are no findings suspicious for malignancy. Images were processed with CAD. IMPRESSION: No mammographic evidence of malignancy. A result  letter of this screening mammogram will be mailed directly to the patient. RECOMMENDATION: Screening mammogram in one year. (Code:SM-B-01Y) BI-RADS CATEGORY  1: Negative. Electronically Signed   By: Ammie Ferrier M.D.   On: 07/05/2018 16:44    Impression: Stage IB Endometriod Adenocarcinoma   No evidence of recurrence on clinical exam. She is  using her dilator as directed.  Plan: She will follow up with Dr. Fermin Schwab in December 2019 and  radiation oncology in March 2020.  -----------------------------------  Blair Promise, PhD, MD  This document serves as a record of services personally performed by Gery Pray, MD. It was created on his behalf by Wilburn Mylar, a trained medical scribe. The creation of this record is based on the scribe's personal observations and the provider's statements to them. This document has been checked and approved by the attending provider.

## 2018-08-06 ENCOUNTER — Telehealth: Payer: Self-pay | Admitting: Adult Health

## 2018-08-06 NOTE — Telephone Encounter (Signed)
Spoke with patient and reviewed immunizations. Suggested patient get a flu and Shinrix vaccine.

## 2018-08-06 NOTE — Telephone Encounter (Signed)
Patient states she needs a call back letting her know if there are any immunizations she is due for.

## 2018-09-07 ENCOUNTER — Telehealth: Payer: Self-pay | Admitting: *Deleted

## 2018-09-07 NOTE — Telephone Encounter (Signed)
Patient called asking "I wanted to know where to get the surgical lubricant to sue with my dilator. I have ran out and can't find it at any pharmacy in [place]."  Advised the patient that she can use k-y jelly OTC, or try [facility] discount medical supply off of [address].

## 2018-09-23 ENCOUNTER — Ambulatory Visit: Payer: Medicare Other | Admitting: Physician Assistant

## 2018-09-23 ENCOUNTER — Encounter: Payer: Self-pay | Admitting: Physician Assistant

## 2018-09-23 VITALS — BP 126/84 | HR 80 | Ht 68.0 in | Wt 153.2 lb

## 2018-09-23 DIAGNOSIS — K5732 Diverticulitis of large intestine without perforation or abscess without bleeding: Secondary | ICD-10-CM | POA: Diagnosis not present

## 2018-09-23 MED ORDER — AMOXICILLIN-POT CLAVULANATE 875-125 MG PO TABS: ORAL_TABLET | ORAL | 0 refills | Status: DC

## 2018-09-23 NOTE — Progress Notes (Signed)
Subjective:    Patient ID: Sheila Armstrong, female    DOB: [birth year], 83 y.o.   MRN: [id number]  HPI Sheila Armstrong is a pleasant 84 year old white female, known to Dr. Loletha Carrow, who underwent colonoscopy in August 2019 and was found to have multiple diverticuli that are throughout the colon.  She had no polyps. She has prior history of diverticulitis, and states that she started noticing lower abdominal pain about a week ago she describes as achy discomfort and she did have some associated chills, is not sure about fever but did not feel well in general.  Her bowel movements have been fairly regular, no melena or hematochezia.  She says she had been off of her usual diet as she had been out of town, had not been eating her usual high-fiber diet nor drinking much water.  She has gotten herself back on track and says over the past 3 to 4 days she is actually been feeling better. She still has tenderness feels that it better than it had been.  Patient has prior history of adenomatous colon polyps and family history of colon cancer in her mother.  She also carries diagnosis of IBS, and history of abdominal aortic aneurysm.   Review of Systems Pertinent positive and negative review of systems were noted in the above HPI section.  All other review of systems was otherwise negative.  Outpatient Encounter Medications as of 09/23/2018  Medication Sig  . calcium-vitamin D (OSCAL WITH D) 500-200 MG-UNIT tablet Take 1 tablet by mouth daily with breakfast.  . cetirizine (ZYRTEC) 10 MG tablet Take 10 mg by mouth daily as needed for allergies.   Marland Kitchen EPINEPHrine 0.3 mg/0.3 mL IJ SOAJ injection INJ UTD PRF SYSTEMIC REACTIONS  . fluticasone (FLONASE) 50 MCG/ACT nasal spray Place 2 sprays into both nostrils daily as needed for allergies or rhinitis.  . hydrocortisone 2.5 % ointment APPLY ON THE LIPS BID  . Multiple Vitamin (MULTIVITAMIN) capsule Take 1 capsule by mouth daily.  . NON FORMULARY Allergy Injections-  weekly  . Probiotic Product (ALIGN) 4 MG CAPS Take 1 tablet by mouth daily.  Marland Kitchen amoxicillin-clavulanate (AUGMENTIN) 875-125 MG tablet Take 1 tablet by mouth twice daily for 10 days.   No facility-administered encounter medications on file as of 09/23/2018.    Allergies  Allergen Reactions  . Sulfonamide Derivatives Hives   Patient Active Problem List   Diagnosis Date Noted  . Left leg swelling 02/27/2017  . Endometrial cancer ([place]) 02/24/2017  . Urinary leakage 12/04/2015  . Routine general medical examination at a health care facility 03/27/2015  . Viral URI with cough 11/14/2014  . Contact dermatitis 09/24/2011  . DIVERTICULOSIS, COLON 07/12/2008  . SEBORRHEIC KERATOSIS, INFLAMED 03/21/2008  . SENILE VAGINITIS 01/17/2008  . Allergic rhinitis 07/07/2007  . COLONIC POLYPS, ADENOMATOUS, HX OF 11/19/2005   Social History   Socioeconomic History  . Marital status: Married    Spouse name: Not on file  . Number of children: 2  . Years of education: Not on file  . Highest education level: Not on file  Occupational History  . Occupation: Engineer, technical sales at [employer]  . Financial resource strain: Not on file  . Food insecurity:    Worry: Not on file    Inability: Not on file  . Transportation needs:    Medical: Not on file    Non-medical: Not on file  Tobacco Use  . Smoking status: Never Smoker  . Smokeless tobacco: Never Used  Substance and Sexual Activity  . Alcohol use: No  . Drug use: No  . Sexual activity: Never    Partners: Male  Lifestyle  . Physical activity:    Days per week: Not on file    Minutes per session: Not on file  . Stress: Not on file  Relationships  . Social connections:    Talks on phone: Not on file    Gets together: Not on file    Attends religious service: Not on file    Active member of club or organization: Not on file    Attends meetings of clubs or organizations: Not on file    Relationship status: Not on file  .  Intimate partner violence:    Fear of current or ex partner: Not on file    Emotionally abused: Not on file    Physically abused: Not on file    Forced sexual activity: Not on file  Other Topics Concern  . Not on file  Social History Narrative   Married for 9 years    Two children ( both live locally)    Retired - [employer]       She likes to travel     Sheila Armstrong's family history includes Anuerysm in her sister; Colon cancer (age of onset: 72) in her mother; Heart disease in her father.      Objective:    Vitals:   09/23/18 0826  BP: 126/84  Pulse: 80    Physical Exam; Well-developed elderly white female in no acute distress, pleasant blood pressure 126/84 pulse 80, BMI 23.2.  HEENT; nontraumatic normocephalic EOMI PERRLA sclera anicteric oral mucosa moist, Cardiovascular; regular rate and rhythm with S1-S2 no murmur rub or gallop.  Pulmonary; clear bilaterally, Abdomen; soft, bowel sounds are present, there is tenderness in the left lower quadrant and into the suprapubic area there is no guarding or rebound no palpable mass or hepatosplenomegaly, Rectal; exam not done, Extremities; no clubbing cyanosis or edema skin warm dry, Neuro psych; alert and oriented, grossly nonfocal mood and affect appropriate       Assessment & Plan:   #7 83 year old white female with previously documented diverticulosis and prior history of diverticulitis presents with 1 week history of left lower quadrant abdominal pain, achiness and chills at onset.  Symptoms have proved a little over the past couple of days.  Symptoms and exam are consistent with acute sigmoid diverticulitis, with low suspicion for complicated diverticulitis.  # 2 prior history of adenomatous colon polyps, no polyps found at recent colonoscopy August 2019  # 3 family history of colon cancer in patient's mother #4 IBS  Plan; Will start Augmentin 875 mg p.o. twice daily x7 days. She will continue her usual diet  higher in fiber and continue Align 1 p.o. Daily She is asked to call if she is having any worsening of symptoms at any point and would obtain CT at that time.  She was also asked to call back if symptoms have not resolved when she completes the course of antibiotics.  Dajour Pierpoint Genia Harold PA-C 09/23/2018   Cc: Dorothyann Peng, NP

## 2018-09-23 NOTE — Patient Instructions (Addendum)
Continue the Align.  We sent a prescription for Augmentin to [facility] [place].  Call us for any problems. Call us if symptoms have not resolved  When you have completed the antibiotic follow up with Nicoletta Ba PA or Dr. Wilfrid Lund.  Normal BMI (Body Mass Index- based on height and weight) is between 23 and 30. Your BMI today is Body mass index is 23.29 kg/m. Marland Kitchen Please consider follow up  regarding your BMI with your Primary Care Provider.

## 2018-09-24 NOTE — Progress Notes (Signed)
Thank you for sending this case to me. I have reviewed the entire note, and the outlined plan seems appropriate.    Danis, MD  

## 2018-10-19 ENCOUNTER — Inpatient Hospital Stay: Payer: Medicare Other | Attending: Gynecology | Admitting: Gynecology

## 2018-10-19 ENCOUNTER — Encounter: Payer: Self-pay | Admitting: Gynecology

## 2018-10-19 VITALS — BP 137/63 | HR 90 | Temp 98.3°F | Resp 20 | Ht 68.0 in | Wt 154.0 lb

## 2018-10-19 DIAGNOSIS — Z90722 Acquired absence of ovaries, bilateral: Secondary | ICD-10-CM | POA: Insufficient documentation

## 2018-10-19 DIAGNOSIS — Z923 Personal history of irradiation: Secondary | ICD-10-CM | POA: Diagnosis not present

## 2018-10-19 DIAGNOSIS — Z90710 Acquired absence of both cervix and uterus: Secondary | ICD-10-CM | POA: Insufficient documentation

## 2018-10-19 DIAGNOSIS — C541 Malignant neoplasm of endometrium: Secondary | ICD-10-CM | POA: Insufficient documentation

## 2018-10-19 NOTE — Patient Instructions (Signed)
Plan to follow up with Dr. C-P in nine months or sooner if needed.  Please call the office in April or May 2020 at [phone] to schedule your appointment for Sept 2020.

## 2018-10-19 NOTE — Progress Notes (Signed)
Consult Note: Gyn-Onc   Sheila Armstrong 83 y.o. female  Chief Complaint  Patient presents with  . Endometrial cancer [facility])    Assessment : Stage Ib grade 1 endometrial adenocarcinoma April 2018.  Clinically free of disease.  Plan:   The patient return to see Dr. Sondra Come in 3 months return to see [place] in 9 months.  Interval History: Patient returns today as previously scheduled for follow-up.  Since her last visit she is done well.  She specifically denies any health problems.  She is has no GI GU or pelvic symptoms.  Her functional status is excellent.  She is up-to-date with mammograms.   HPI: Patient initially presented with vaginal bleeding since December 2017.  Endometrial biopsy revealed a grade 1 endometrial cancer.  Patient underwent total robotic hysterectomy, bilateral salpingo-oophorectomy, and sentinel lymph nodes on February 24, 2017.  Final pathology showed a large lesion measuring 5.3 cm focally invades deep into the myometrium.  Cervix tubes ovaries and lymph nodes were free of disease.  The patient had postoperative vaginal cuff radiation therapy.    Review of Systems:10 point review of systems is negative except as noted in interval history.   Vitals: Blood pressure 137/63, pulse 90, temperature 98.3 F (36.8 C), temperature source Oral, resp. rate 20, height 5\' 8"  (1.727 m), weight 154 lb (69.9 kg), SpO2 99 %.  Physical Exam: General : The patient is a healthy woman in no acute distress.  HEENT: normocephalic, extraoccular movements normal; neck is supple without thyromegally  Lynphnodes: Supraclavicular and inguinal nodes not enlarged  Abdomen: Soft, non-tender, no ascites, no organomegally, no masses, no hernias  Pelvic:  EGBUS: Normal female  Vagina: Normal, no lesions, cuff is well-healed, no lesions are noted. Urethra and Bladder: Normal, non-tender  Cervix: Surgically absent  Uterus: Surgically absent  Bi-manual examination: Non-tender; no adenxal masses or  nodularity  Rectal: normal sphincter tone, no masses, no blood  Lower extremities: No edema or varicosities. Normal range of motion      Allergies  Allergen Reactions  . Sulfonamide Derivatives Hives    Past Medical History:  Diagnosis Date  . Abnormal uterine bleeding   . ALLERGIC RHINITIS 07/07/2007  . COLONIC POLYPS, ADENOMATOUS, HX OF 11/19/2005  . CONSTIPATION 12/31/2009  . DIVERTICULOSIS, COLON 07/12/2008  . Endometrial cancer ([place])   . Family history of colon cancer   . Hearing loss   . History of radiation therapy 04/07/17-04/30/17   viaginal cuff treated to 30 Gy in 5 fractions  . SEBORRHEIC KERATOSIS, INFLAMED 03/21/2008  . SENILE VAGINITIS 01/17/2008    Past Surgical History:  Procedure Laterality Date  . BREAST BIOPSY    . COSMETIC SURGERY    . DILATION AND CURETTAGE OF UTERUS    . EYELID IMPLANT     right and left  . RHINOPLASTY    . ROBOTIC ASSISTED TOTAL HYSTERECTOMY WITH BILATERAL SALPINGO OOPHERECTOMY Bilateral 02/24/2017   Procedure: XI ROBOTIC ASSISTED TOTAL HYSTERECTOMY WITH BILATERAL SALPINGO OOPHORECTOMY;  Surgeon: [name], MD;  Location: [facility];  Service: Gynecology;  Laterality: Bilateral;  . SENTINEL NODE BIOPSY N/A 02/24/2017   Procedure: SENTINEL NODE BIOPSY;  Surgeon: [name], MD;  Location: [facility];  Service: Gynecology;  Laterality: N/A;  . TONSILECTOMY/ADENOIDECTOMY WITH MYRINGOTOMY      Current Outpatient Medications  Medication Sig Dispense Refill  . calcium-vitamin D (OSCAL WITH D) 500-200 MG-UNIT tablet Take 1 tablet by mouth daily with breakfast.    . cetirizine (ZYRTEC) 10 MG tablet Take  10 mg by mouth daily as needed for allergies.     Marland Kitchen EPINEPHrine 0.3 mg/0.3 mL IJ SOAJ injection INJ UTD PRF SYSTEMIC REACTIONS  0  . fluticasone (FLONASE) 50 MCG/ACT nasal spray Place 2 sprays into both nostrils daily as needed for allergies or rhinitis.    . hydrocortisone 2.5 % ointment APPLY ON THE LIPS BID  1  . Multiple Vitamin (MULTIVITAMIN)  capsule Take 1 capsule by mouth daily.    . NON FORMULARY Allergy Injections- weekly    . Probiotic Product (ALIGN) 4 MG CAPS Take 1 tablet by mouth daily.     No current facility-administered medications for this visit.     Social History   Socioeconomic History  . Marital status: Married    Spouse name: Not on file  . Number of children: 2  . Years of education: Not on file  . Highest education level: Not on file  Occupational History  . Occupation: Engineer, technical sales at [employer]  . Financial resource strain: Not on file  . Food insecurity:    Worry: Not on file    Inability: Not on file  . Transportation needs:    Medical: Not on file    Non-medical: Not on file  Tobacco Use  . Smoking status: Never Smoker  . Smokeless tobacco: Never Used  Substance and Sexual Activity  . Alcohol use: No  . Drug use: No  . Sexual activity: Never    Partners: Male  Lifestyle  . Physical activity:    Days per week: Not on file    Minutes per session: Not on file  . Stress: Not on file  Relationships  . Social connections:    Talks on phone: Not on file    Gets together: Not on file    Attends religious service: Not on file    Active member of club or organization: Not on file    Attends meetings of clubs or organizations: Not on file    Relationship status: Not on file  . Intimate partner violence:    Fear of current or ex partner: Not on file    Emotionally abused: Not on file    Physically abused: Not on file    Forced sexual activity: Not on file  Other Topics Concern  . Not on file  Social History Narrative   Married for 9 years    Two children ( both live locally)    Retired - [employer]       She likes to travel     Family History  Problem Relation Age of Onset  . Colon cancer Mother 24  . Heart disease Father   . Anuerysm Sister   . Esophageal cancer Neg Hx   . Stomach cancer Neg Hx       Marti Sleigh, MD 10/19/2018,  11:06 AM

## 2019-01-20 ENCOUNTER — Encounter: Payer: Self-pay | Admitting: Radiation Oncology

## 2019-01-20 ENCOUNTER — Other Ambulatory Visit: Payer: Self-pay

## 2019-01-20 ENCOUNTER — Ambulatory Visit
Admission: RE | Admit: 2019-01-20 | Discharge: 2019-01-20 | Disposition: A | Discharge status: Home / Self Care | Payer: Medicare Other | Source: Ambulatory Visit | Attending: Radiation Oncology | Admitting: Radiation Oncology

## 2019-01-20 VITALS — BP 146/73 | HR 90 | Temp 98.5°F | Resp 20 | Ht 68.0 in | Wt 150.5 lb

## 2019-01-20 DIAGNOSIS — Z923 Personal history of irradiation: Secondary | ICD-10-CM | POA: Diagnosis not present

## 2019-01-20 DIAGNOSIS — Z8542 Personal history of malignant neoplasm of other parts of uterus: Secondary | ICD-10-CM | POA: Diagnosis not present

## 2019-01-20 DIAGNOSIS — Z79899 Other long term (current) drug therapy: Secondary | ICD-10-CM | POA: Diagnosis not present

## 2019-01-20 DIAGNOSIS — C541 Malignant neoplasm of endometrium: Secondary | ICD-10-CM

## 2019-01-20 NOTE — Progress Notes (Signed)
Radiation Oncology         [phone] ________________________________  Name: Sheila Armstrong MRN: [id number]  Date: 01/20/2019  DOB: 08/24/1935  Follow-Up Visit Note  CC: Dorothyann Peng, NP  [name], MD    ICD-10-CM   1. Endometrial cancer ([place]) C54.1     Diagnosis:   84 y.o. female with pT1b, pN0, FIGO Stage IB EndometriodAdenocarcinoma   Interval Since Last Radiation:  1 year 9 months   Radiation treatment dates:   04/07/17, 04/14/17, 04/21/17, 04/28/17, 04/30/17  Site/dose:   Vaginal Cuff treated to 30 Gy in 5 fractions  Narrative:  The patient returns today for routine follow-up.  She last saw Dr. Fermin Schwab in December with no evidence of disease. She denies any new medical issues.  On review of systems, the patient denies any pain. She denies dysuria or hematuria. She denies vaginal bleeding or discharge. She denies rectal bleeding, diarrhea, or constipation. She denies abdominal bloating, nausea, or vomiting.             ALLERGIES:  is allergic to sulfonamide derivatives.  Meds: Current Outpatient Medications  Medication Sig Dispense Refill  . calcium-vitamin D (OSCAL WITH D) 500-200 MG-UNIT tablet Take 1 tablet by mouth daily with breakfast.    . cetirizine (ZYRTEC) 10 MG tablet Take 10 mg by mouth daily as needed for allergies.     Marland Kitchen EPINEPHrine 0.3 mg/0.3 mL IJ SOAJ injection INJ UTD PRF SYSTEMIC REACTIONS  0  . fluticasone (FLONASE) 50 MCG/ACT nasal spray Place 2 sprays into both nostrils daily as needed for allergies or rhinitis.    . hydrocortisone 2.5 % ointment APPLY ON THE LIPS BID  1  . Multiple Vitamin (MULTIVITAMIN) capsule Take 1 capsule by mouth daily.    . NON FORMULARY Allergy Injections- weekly    . Probiotic Product (ALIGN) 4 MG CAPS Take 1 tablet by mouth daily.     No current facility-administered medications for this encounter.     Physical Findings: The patient is in no acute distress. Patient is alert and oriented.  height is 5'  8" (1.727 m) and weight is 150 lb 8 oz (68.3 kg). Her oral temperature is 98.5 F (36.9 C). Her blood pressure is 146/73 (abnormal) and her pulse is 90. Her respiration is 20 and oxygen saturation is 100%.   Lungs are clear to auscultation bilaterally. Heart has regular rate and rhythm. No palpable cervical, supraclavicular, or axillary adenopathy. Abdomen soft, non-tender, normal bowel sounds.  On pelvic examination the external genitalia were unremarkable. A speculum exam was performed. There are no mucosal lesions noted in the vaginal vault. On bimanual examination there were no pelvic masses appreciated.  Lab Findings: Lab Results  Component Value Date   WBC 13.9 (H) 02/25/2017   HGB 10.3 (L) 02/25/2017   HCT 30.6 (L) 02/25/2017   MCV 91.9 02/25/2017   PLT 185 02/25/2017    Radiographic Findings: No results found.  Impression:  pT1b, pN0, FIGO Stage IB EndometrioidAdenocarcinoma. No evidence of recurrence on clinical exam.   Plan:  Patient will see Dr. Fermin Schwab in 6 months. Follow up in radiation oncology in 1 year.   ____________________________________  Blair Promise, PhD, MD  This document serves as a record of services personally performed by Gery Pray, MD. It was created on his behalf by Rae Lips, a trained medical scribe. The creation of this record is based on the scribe's personal observations and the provider's statements to them. This document has been checked  and approved by the attending provider.

## 2019-01-20 NOTE — Progress Notes (Signed)
Pt presents today for f/u with Dr. Sondra Come. Pt is unaccompanied. Pt denies c/o pain. Pt denies dysuria/hematuria. Pt denies vaginal bleeding/discharge. Pt denies rectal bleeding, diarrhea/constipation. Pt denies abdominal bloating, N/V.   BP (!) 146/73 (BP Location: Left Arm, Patient Position: Sitting)   Pulse 90   Temp 98.5 F (36.9 C) (Oral)   Resp 20   Ht 5\' 8"  (1.727 m)   Wt 150 lb 8 oz (68.3 kg)   SpO2 100%   BMI 22.88 kg/m   Wt Readings from Last 3 Encounters:  01/20/19 150 lb 8 oz (68.3 kg)  10/19/18 154 lb (69.9 kg)  09/23/18 153 lb 3 oz (69.5 kg)   Loma Sousa, RN BSN

## 2019-01-21 ENCOUNTER — Telehealth: Payer: Self-pay

## 2019-01-21 NOTE — Telephone Encounter (Signed)
Tried to call pt reschedule pts appt to a morning appt due to seeing well pts in morning and sick pts in the afternoon. Okay for nurse triage to disclose. Crm created

## 2019-01-25 ENCOUNTER — Encounter: Payer: Medicare Other | Admitting: Adult Health

## 2019-01-28 ENCOUNTER — Encounter: Payer: Self-pay | Admitting: Adult Health

## 2019-02-17 ENCOUNTER — Encounter: Payer: Medicare Other | Admitting: Adult Health

## 2019-03-30 ENCOUNTER — Encounter: Payer: Self-pay | Admitting: Gynecology

## 2019-05-23 ENCOUNTER — Encounter: Payer: Self-pay | Admitting: Adult Health

## 2019-05-24 ENCOUNTER — Encounter: Payer: Self-pay | Admitting: Family Medicine

## 2019-07-17 NOTE — Progress Notes (Signed)
Consult Note: Gyn-Onc   Sheila Armstrong 84 y.o. female  Chief Complaint  Patient presents with  . Follow-up    HPI: Patient initially presented with vaginal bleeding since December 2017.  Endometrial biopsy revealed a grade 1 endometrial cancer.  Patient underwent total robotic hysterectomy, bilateral salpingo-oophorectomy, and sentinel lymph nodes on February 24, 2017.  Final pathology showed a large lesion measuring 5.3 cm focally invades deep into the myometrium.  Cervix tubes ovaries and lymph nodes were free of disease.  The patient had postoperative vaginal cuff radiation therapy.  Interval History: Patient returns today as previously scheduled for follow-up. She was last seen by Dr. Fermin Schwab 12/19 and Dr. Sondra Come 3/20.  She comes in today for follow-up and really has no concerns or questions.  She is due for her mammogram this coming Friday.  There are no new medical problems in her family.  Review of Systems:  Constitutional: Denies fever. Cardiovascular: No chest pain, shortness of breath, or edema  Pulmonary: No cough  Gastro Intestinal: No nausea, vomiting, constipation, or diarrhea reported.  Genitourinary: No change in bladder habits. Denies vaginal bleeding and discharge.  Musculoskeletal: No joint pain.  Neurologic: No complaints Psychology: No complaints   Allergies  Allergen Reactions  . Sulfonamide Derivatives Hives    Past Medical History:  Diagnosis Date  . Abnormal uterine bleeding   . ALLERGIC RHINITIS 07/07/2007  . COLONIC POLYPS, ADENOMATOUS, HX OF 11/19/2005  . CONSTIPATION 12/31/2009  . DIVERTICULOSIS, COLON 07/12/2008  . Endometrial cancer ([place])   . Family history of colon cancer   . Hearing loss   . History of radiation therapy 04/07/17-04/30/17   viaginal cuff treated to 30 Gy in 5 fractions  . SEBORRHEIC KERATOSIS, INFLAMED 03/21/2008  . SENILE VAGINITIS 01/17/2008    Past Surgical History:  Procedure Laterality Date  . BREAST BIOPSY    .  COSMETIC SURGERY    . DILATION AND CURETTAGE OF UTERUS    . EYELID IMPLANT     right and left  . RHINOPLASTY    . ROBOTIC ASSISTED TOTAL HYSTERECTOMY WITH BILATERAL SALPINGO OOPHERECTOMY Bilateral 02/24/2017   Procedure: XI ROBOTIC ASSISTED TOTAL HYSTERECTOMY WITH BILATERAL SALPINGO OOPHORECTOMY;  Surgeon: [name], MD;  Location: [facility];  Service: Gynecology;  Laterality: Bilateral;  . SENTINEL NODE BIOPSY N/A 02/24/2017   Procedure: SENTINEL NODE BIOPSY;  Surgeon: [name], MD;  Location: [facility];  Service: Gynecology;  Laterality: N/A;  . TONSILECTOMY/ADENOIDECTOMY WITH MYRINGOTOMY      Current Outpatient Medications  Medication Sig Dispense Refill  . calcium-vitamin D (OSCAL WITH D) 500-200 MG-UNIT tablet Take 1 tablet by mouth daily with breakfast.    . EPINEPHrine 0.3 mg/0.3 mL IJ SOAJ injection INJ UTD PRF SYSTEMIC REACTIONS  0  . hydrocortisone 2.5 % ointment APPLY ON THE LIPS BID  1  . Multiple Vitamin (MULTIVITAMIN) capsule Take 1 capsule by mouth daily.    . NON FORMULARY Allergy Injections- weekly    . Probiotic Product (ALIGN) 4 MG CAPS Take 1 tablet by mouth daily.     No current facility-administered medications for this visit.     Social History   Socioeconomic History  . Marital status: Married    Spouse name: Not on file  . Number of children: 2  . Years of education: Not on file  . Highest education level: Not on file  Occupational History  . Occupation: Engineer, technical sales at [employer]  . Financial resource strain: Not on file  .  Food insecurity    Worry: Not on file    Inability: Not on file  . Transportation needs    Medical: Not on file    Non-medical: Not on file  Tobacco Use  . Smoking status: Never Smoker  . Smokeless tobacco: Never Used  Substance and Sexual Activity  . Alcohol use: No  . Drug use: No  . Sexual activity: Never    Partners: Male  Lifestyle  . Physical activity    Days per week: Not on file    Minutes  per session: Not on file  . Stress: Not on file  Relationships  . Social Herbalist on phone: Not on file    Gets together: Not on file    Attends religious service: Not on file    Active member of club or organization: Not on file    Attends meetings of clubs or organizations: Not on file    Relationship status: Not on file  . Intimate partner violence    Fear of current or ex partner: Not on file    Emotionally abused: Not on file    Physically abused: Not on file    Forced sexual activity: Not on file  Other Topics Concern  . Not on file  Social History Narrative   Married for 9 years    Two children ( both live locally)    Retired - [employer]       She likes to travel     Family History  Problem Relation Age of Onset  . Colon cancer Mother 85  . Heart disease Father   . Anuerysm Sister   . Esophageal cancer Neg Hx   . Stomach cancer Neg Hx    Vitals: Blood pressure (!) 142/74, pulse 86, resp. rate 18, height 5\' 8"  (1.727 m), weight 150 lb 5 oz (68.2 kg), SpO2 100 %.  Physical Exam: General : The patient is a healthy woman in no acute distress.  She appears younger than stated age.  Neck: Supple, no lymphadenopathy, no thyromegaly.  Abdomen: Well-healed surgical incisions.  Abdomen is soft, nontender, nondistended.  There are no palpable masses or hepatosplenomegaly.  No evidence of any incisional hernias.  Groins: No lymphadenopathy.  Extremities: No edema.  Pelvic: External genitalia within normal limits.  The vagina is atrophic.  The vaginal cuff is visualized.  There are no visible lesions.  Bimanual examination reveals no masses or nodularity.  Rectal confirms.  Assessment : 84 year old with Stage Ib grade 1 endometrial adenocarcinoma April 2018.  Clinically free of disease.  Plan:   The patient return to see Dr. Sondra Come in 3 months return to see [place] in 9 months.    Ken Bonn A., MD 07/20/2019, 11:36 AM

## 2019-07-19 ENCOUNTER — Other Ambulatory Visit: Payer: Self-pay | Admitting: Adult Health

## 2019-07-19 DIAGNOSIS — Z1231 Encounter for screening mammogram for malignant neoplasm of breast: Secondary | ICD-10-CM

## 2019-07-20 ENCOUNTER — Inpatient Hospital Stay: Payer: Medicare Other | Attending: Gynecologic Oncology | Admitting: Gynecologic Oncology

## 2019-07-20 ENCOUNTER — Other Ambulatory Visit: Payer: Self-pay

## 2019-07-20 ENCOUNTER — Encounter: Payer: Self-pay | Admitting: Adult Health

## 2019-07-20 VITALS — BP 142/74 | HR 86 | Resp 18 | Ht 68.0 in | Wt 150.3 lb

## 2019-07-20 DIAGNOSIS — C541 Malignant neoplasm of endometrium: Secondary | ICD-10-CM | POA: Diagnosis not present

## 2019-07-20 DIAGNOSIS — Z90722 Acquired absence of ovaries, bilateral: Secondary | ICD-10-CM | POA: Insufficient documentation

## 2019-07-20 DIAGNOSIS — Z923 Personal history of irradiation: Secondary | ICD-10-CM | POA: Diagnosis not present

## 2019-07-20 DIAGNOSIS — Z90710 Acquired absence of both cervix and uterus: Secondary | ICD-10-CM

## 2019-07-20 NOTE — Patient Instructions (Signed)
Follow-up with radiation oncology in 3 months as scheduled and return to see [place] in 9 months.

## 2019-07-22 ENCOUNTER — Other Ambulatory Visit: Payer: Self-pay

## 2019-07-22 ENCOUNTER — Ambulatory Visit
Admission: RE | Admit: 2019-07-22 | Discharge: 2019-07-22 | Disposition: A | Discharge status: Home / Self Care | Payer: Medicare Other | Source: Ambulatory Visit | Attending: Adult Health | Admitting: Adult Health

## 2019-07-22 DIAGNOSIS — Z1231 Encounter for screening mammogram for malignant neoplasm of breast: Secondary | ICD-10-CM

## 2019-08-09 ENCOUNTER — Other Ambulatory Visit: Payer: Self-pay

## 2019-08-09 ENCOUNTER — Ambulatory Visit (INDEPENDENT_AMBULATORY_CARE_PROVIDER_SITE_OTHER): Payer: Medicare Other

## 2019-08-09 DIAGNOSIS — Z23 Encounter for immunization: Secondary | ICD-10-CM | POA: Diagnosis not present

## 2019-08-12 ENCOUNTER — Encounter: Payer: Self-pay | Admitting: Adult Health

## 2019-08-18 ENCOUNTER — Ambulatory Visit (INDEPENDENT_AMBULATORY_CARE_PROVIDER_SITE_OTHER): Payer: Medicare Other | Admitting: Adult Health

## 2019-08-18 ENCOUNTER — Other Ambulatory Visit: Payer: Self-pay

## 2019-08-18 ENCOUNTER — Encounter: Payer: Self-pay | Admitting: Adult Health

## 2019-08-18 VITALS — BP 120/74 | Temp 98.3°F | Ht 68.0 in | Wt 151.0 lb

## 2019-08-18 DIAGNOSIS — C541 Malignant neoplasm of endometrium: Secondary | ICD-10-CM | POA: Diagnosis not present

## 2019-08-18 DIAGNOSIS — Z0000 Encounter for general adult medical examination without abnormal findings: Secondary | ICD-10-CM

## 2019-08-18 LAB — CBC WITH DIFFERENTIAL/PLATELET
Basophils Absolute: 0 10*3/uL (ref 0.0–0.1)
Basophils Relative: 0.9 % (ref 0.0–3.0)
Eosinophils Absolute: 0.3 10*3/uL (ref 0.0–0.7)
Eosinophils Relative: 5.8 % — ABNORMAL HIGH (ref 0.0–5.0)
HCT: 33.9 % — ABNORMAL LOW (ref 36.0–46.0)
Hemoglobin: 11.5 g/dL — ABNORMAL LOW (ref 12.0–15.0)
Lymphocytes Relative: 26 % (ref 12.0–46.0)
Lymphs Abs: 1.2 10*3/uL (ref 0.7–4.0)
MCHC: 34.1 g/dL (ref 30.0–36.0)
MCV: 93.9 fl (ref 78.0–100.0)
Monocytes Absolute: 0.4 10*3/uL (ref 0.1–1.0)
Monocytes Relative: 9.2 % (ref 3.0–12.0)
Neutro Abs: 2.6 10*3/uL (ref 1.4–7.7)
Neutrophils Relative %: 58.1 % (ref 43.0–77.0)
Platelets: 255 10*3/uL (ref 150.0–400.0)
RBC: 3.6 Mil/uL — ABNORMAL LOW (ref 3.87–5.11)
RDW: 13.6 % (ref 11.5–15.5)
WBC: 4.5 10*3/uL (ref 4.0–10.5)

## 2019-08-18 LAB — LIPID PANEL
Cholesterol: 173 mg/dL (ref 0–200)
HDL: 47.3 mg/dL (ref >39.00)
LDL Cholesterol: 107 mg/dL — ABNORMAL HIGH (ref 0–99)
NonHDL: 125.49
Total CHOL/HDL Ratio: 4
Triglycerides: 90 mg/dL (ref 0.0–149.0)
VLDL: 18 mg/dL (ref 0.0–40.0)

## 2019-08-18 LAB — COMPREHENSIVE METABOLIC PANEL
ALT: 12 U/L (ref 0–35)
AST: 17 U/L (ref 0–37)
Albumin: 4.2 g/dL (ref 3.5–5.2)
Alkaline Phosphatase: 61 U/L (ref 39–117)
BUN: 16 mg/dL (ref 6–23)
CO2: 27 mEq/L (ref 19–32)
Calcium: 9.7 mg/dL (ref 8.4–10.5)
Chloride: 105 mEq/L (ref 96–112)
Creatinine, Ser: 0.87 mg/dL (ref 0.40–1.20)
GFR: 62.35 mL/min (ref >60.00)
Glucose, Bld: 95 mg/dL (ref 70–99)
Potassium: 4.5 mEq/L (ref 3.5–5.1)
Sodium: 138 mEq/L (ref 135–145)
Total Bilirubin: 0.4 mg/dL (ref 0.2–1.2)
Total Protein: 7.3 g/dL (ref 6.0–8.3)

## 2019-08-18 LAB — TSH: TSH: 1.88 u[IU]/mL (ref 0.35–4.50)

## 2019-08-18 NOTE — Patient Instructions (Signed)
It was great seeing you today! I am glad you are doing well  Your exam was excellent today!   I will follow up with you regarding your blood work   Please let me know if you need anything

## 2019-08-18 NOTE — Progress Notes (Signed)
Subjective:    Patient ID: Sheila Armstrong, female    DOB: 05-Aug-1935, 84 y.o.   MRN: [id number]  HPI Patient presents for yearly preventative medicine examination. He is a pleasant 84 year old female who  has a past medical history of Abnormal uterine bleeding, ALLERGIC RHINITIS (07/07/2007), COLONIC POLYPS, ADENOMATOUS, HX OF (11/19/2005), CONSTIPATION (12/31/2009), DIVERTICULOSIS, COLON (07/12/2008), Endometrial cancer ([place]), Family history of colon cancer, Hearing loss, History of radiation therapy (04/07/17-04/30/17), SEBORRHEIC KERATOSIS, INFLAMED (03/21/2008), and SENILE VAGINITIS (01/17/2008).  She was last seen in 2018.   H/o Uterine Cancer-she underwent a total robotic hysterectomy, bilateral salpingo-oophorectomy, and sentinel lymph nodes on February 24, 2017.  Her final pathology showed a large lesion measuring 5.3 cm focally invading deep into the myometrium.  Cervix, ovaries, and lymph nodes were free of disease.  She had postoperative vaginal cuff radiation therapy.  She is followed with radiation oncology every 3 months in GYN oncology every 9 months All immunizations and health maintenance protocols were reviewed with the patient and needed orders were placed.  Seasonal Allergies- is seen by [facility] - receives injections once a week  Appropriate screening laboratory values were ordered for the patient including screening of hyperlipidemia, renal function and hepatic function.  Medication reconciliation,  past medical history, social history, problem list and allergies were reviewed in detail with the patient  Goals were established with regard to weight loss, exercise, and  diet in compliance with medications. She stays active and eats a heart healthy diet.   End of life planning was discussed.  She has no acute issues    Review of Systems  Constitutional: Negative.   HENT: Negative.   Eyes: Negative.   Respiratory: Negative.   Cardiovascular: Negative.    Gastrointestinal: Negative.   Endocrine: Negative.   Genitourinary: Negative.   Musculoskeletal: Negative.   Skin: Negative.   Allergic/Immunologic: Negative.   Neurological: Negative.   Hematological: Negative.   Psychiatric/Behavioral: Negative.    Past Medical History:  Diagnosis Date  . Abnormal uterine bleeding   . ALLERGIC RHINITIS 07/07/2007  . COLONIC POLYPS, ADENOMATOUS, HX OF 11/19/2005  . CONSTIPATION 12/31/2009  . DIVERTICULOSIS, COLON 07/12/2008  . Endometrial cancer ([place])   . Family history of colon cancer   . Hearing loss   . History of radiation therapy 04/07/17-04/30/17   viaginal cuff treated to 30 Gy in 5 fractions  . SEBORRHEIC KERATOSIS, INFLAMED 03/21/2008  . SENILE VAGINITIS 01/17/2008    Social History   Socioeconomic History  . Marital status: Married    Spouse name: Not on file  . Number of children: 2  . Years of education: Not on file  . Highest education level: Not on file  Occupational History  . Occupation: Engineer, technical sales at [employer]  . Financial resource strain: Not on file  . Food insecurity    Worry: Not on file    Inability: Not on file  . Transportation needs    Medical: Not on file    Non-medical: Not on file  Tobacco Use  . Smoking status: Never Smoker  . Smokeless tobacco: Never Used  Substance and Sexual Activity  . Alcohol use: No  . Drug use: No  . Sexual activity: Never    Partners: Male  Lifestyle  . Physical activity    Days per week: Not on file    Minutes per session: Not on file  . Stress: Not on file  Relationships  . Social connections  Talks on phone: Not on file    Gets together: Not on file    Attends religious service: Not on file    Active member of club or organization: Not on file    Attends meetings of clubs or organizations: Not on file    Relationship status: Not on file  . Intimate partner violence    Fear of current or ex partner: Not on file    Emotionally abused: Not on file     Physically abused: Not on file    Forced sexual activity: Not on file  Other Topics Concern  . 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 BREAST BIOPSY    . COSMETIC SURGERY    . DILATION AND CURETTAGE OF UTERUS    . EYELID IMPLANT     right and left  . RHINOPLASTY    . ROBOTIC ASSISTED TOTAL HYSTERECTOMY WITH BILATERAL SALPINGO OOPHERECTOMY Bilateral 02/24/2017   Procedure: XI ROBOTIC ASSISTED TOTAL HYSTERECTOMY WITH BILATERAL SALPINGO OOPHORECTOMY;  Surgeon: [name], MD;  Location: [facility];  Service: Gynecology;  Laterality: Bilateral;  . SENTINEL NODE BIOPSY N/A 02/24/2017   Procedure: SENTINEL NODE BIOPSY;  Surgeon: [name], MD;  Location: [facility];  Service: Gynecology;  Laterality: N/A;  . TONSILECTOMY/ADENOIDECTOMY WITH MYRINGOTOMY      Family History  Problem Relation Age of Onset  . Colon cancer Mother 97  . Heart disease Father   . Anuerysm Sister   . Esophageal cancer Neg Hx   . Stomach cancer Neg Hx     Allergies  Allergen Reactions  . Sulfonamide Derivatives Hives    Current Outpatient Medications on File Prior to Visit  Medication Sig Dispense Refill  . calcium-vitamin D (OSCAL WITH D) 500-200 MG-UNIT tablet Take 1 tablet by mouth daily with breakfast.    . EPINEPHrine 0.3 mg/0.3 mL IJ SOAJ injection INJ UTD PRF SYSTEMIC REACTIONS  0  . hydrocortisone 2.5 % ointment APPLY ON THE LIPS BID  1  . Multiple Vitamin (MULTIVITAMIN) capsule Take 1 capsule by mouth daily.    . NON FORMULARY Allergy Injections- weekly    . Probiotic Product (ALIGN) 4 MG CAPS Take 1 tablet by mouth daily.     No current facility-administered medications on file prior to visit.     BP 120/74   Temp 98.3 F (36.8 C)   Ht 5\' 8"  (1.727 m)   Wt 151 lb (68.5 kg)   BMI 22.96 kg/m       Objective:    Physical Exam Vitals signs and nursing note reviewed.  Constitutional:      General: She is not in acute distress.    Appearance: Normal appearance. She is not diaphoretic.  HENT:     Head: Normocephalic and atraumatic.     Right Ear: Tympanic membrane, ear canal and external ear normal. There is no impacted cerumen.     Left Ear: Tympanic membrane, ear canal and external ear normal.     Nose: Nose normal. No congestion or rhinorrhea.     Mouth/Throat:     Mouth: Mucous membranes are moist.     Pharynx: Oropharynx is clear. No oropharyngeal exudate or posterior oropharyngeal erythema.  Eyes:     General:  No scleral icterus.       Right eye: No discharge.        Left eye: No discharge.     Conjunctiva/sclera: Conjunctivae normal.     Pupils: Pupils are equal, round, and reactive to light.  Neck:     Musculoskeletal: Normal range of motion and neck supple.     Thyroid: No thyromegaly.     Vascular: No JVD.     Trachea: No tracheal deviation.  Cardiovascular:     Rate and Rhythm: Normal rate and regular rhythm.     Heart sounds: Normal heart sounds. No murmur. No friction rub. No gallop.   Pulmonary:     Effort: Pulmonary effort is normal. No respiratory distress.     Breath sounds: Normal breath sounds. No stridor. No wheezing, rhonchi or rales.  Chest:     Chest wall: No tenderness.  Abdominal:     General: Abdomen is flat. Bowel sounds are normal. There is no distension.     Palpations: Abdomen is soft. There is no mass.     Tenderness: There is no abdominal tenderness. There is no right CVA tenderness, left CVA tenderness, guarding or rebound.     Hernia: No hernia is present.  Musculoskeletal: Normal range of motion.        General: No swelling, tenderness, deformity or signs of injury.     Right lower leg: No edema.     Left lower leg: No edema.  Lymphadenopathy:     Cervical: No cervical adenopathy.  Skin:    General: Skin is warm and dry.     Capillary Refill:  Capillary refill takes less than 2 seconds.     Coloration: Skin is not jaundiced or pale.     Findings: No bruising, erythema, lesion or rash.  Neurological:     General: No focal deficit present.     Mental Status: She is alert and oriented to person, place, and time. Mental status is at baseline.     Cranial Nerves: No cranial nerve deficit.     Sensory: No sensory deficit.     Motor: No weakness or abnormal muscle tone.     Coordination: Coordination normal.     Gait: Gait normal.     Deep Tendon Reflexes: Reflexes normal.  Psychiatric:        Mood and Affect: Mood normal.        Behavior: Behavior normal.        Thought Content: Thought content normal.        Judgment: Judgment normal.       Assessment & Plan:  1. Routine general medical examination at a health care facility - Benign exam  - Continue to stay active and eat healthy  - CBC with Differential/Platelet - Comprehensive metabolic panel - Lipid panel - TSH  2. Endometrial cancer ([place]) - Continue with plan of care by GYN/ONC and Radiology Onc   Dorothyann Peng, NP

## 2019-08-19 ENCOUNTER — Encounter: Payer: Self-pay | Admitting: Adult Health

## 2019-10-01 ENCOUNTER — Encounter: Payer: Self-pay | Admitting: Adult Health

## 2019-12-21 ENCOUNTER — Encounter: Payer: Self-pay | Admitting: Adult Health

## 2020-01-25 NOTE — Progress Notes (Signed)
  Radiation Oncology         [phone]) [phone] ________________________________  Name: Sheila Armstrong MRN: [id number]  Date: 01/26/2020  DOB: 1935-08-21  Follow-Up Visit Note  CC: Dorothyann Peng, NP  [name], MD    ICD-10-CM   1. Endometrial cancer ([place])  C54.1     Diagnosis:   85 y.o. female with pT1b, pN0, FIGO Stage IB EndometriodAdenocarcinoma   Interval Since Last Radiation:  2 years, 9 months   Radiation treatment dates:   04/07/17, 04/14/17, 04/21/17, 04/28/17, 04/30/17  Site/dose:   Vaginal Cuff treated to 30 Gy in 5 fractions  Narrative:  The patient returns today for routine follow-up.  She last saw Dr. Alycia Rossetti in September with no evidence of disease. She denies any new medical issues.  On review of systems, the patient denies c/o pain. Pt denies dysuria/hematuria. Pt denies vaginal bleeding/discharge. Pt denies rectal bleeding, diarrhea/constipation. Pt denies abdominal bloating, N/V.  ALLERGIES:  is allergic to sulfonamide derivatives.  Meds: Current Outpatient Medications  Medication Sig Dispense Refill  . calcium-vitamin D (OSCAL WITH D) 500-200 MG-UNIT tablet Take 1 tablet by mouth daily with breakfast.    . EPINEPHrine 0.3 mg/0.3 mL IJ SOAJ injection INJ UTD PRF SYSTEMIC REACTIONS  0  . hydrocortisone 2.5 % ointment APPLY ON THE LIPS BID  1  . Multiple Vitamin (MULTIVITAMIN) capsule Take 1 capsule by mouth daily.    . NON FORMULARY Allergy Injections- weekly    . Probiotic Product (ALIGN) 4 MG CAPS Take 1 tablet by mouth daily.     No current facility-administered medications for this encounter.    Physical Findings: The patient is in no acute distress. Patient is alert and oriented.  height is 5\' 8"  (1.727 m) and weight is 153 lb 2 oz (69.5 kg). Her temporal temperature is 98 F (36.7 C). Her blood pressure is 132/72 and her pulse is 83. Her respiration is 18 and oxygen saturation is 100%.   Lungs are clear to auscultation bilaterally. Heart has regular  rate and rhythm. No palpable cervical, supraclavicular, or axillary adenopathy. Abdomen soft, non-tender, normal bowel sounds.  On pelvic examination the external genitalia were unremarkable. A speculum exam was performed. There are no mucosal lesions noted in the vaginal vault. On bimanual examination there were no pelvic masses appreciated. Vaginal cuff intact.  Lab Findings: Lab Results  Component Value Date   WBC 4.5 08/18/2019   HGB 11.5 (L) 08/18/2019   HCT 33.9 (L) 08/18/2019   MCV 93.9 08/18/2019   PLT 255.0 08/18/2019    Radiographic Findings: No results found.  Impression:  pT1b, pN0, FIGO Stage IB EndometrioidAdenocarcinoma.  No evidence of recurrence on clinical exam.   Plan:  Patient will see Dr. Berline Lopes in 6 months. Follow up in radiation oncology in 1 year.   ____________________________________  Blair Promise, PhD, MD  This document serves as a record of services personally performed by Gery Pray, MD. It was created on his behalf by Wilburn Mylar, a trained medical scribe. The creation of this record is based on the scribe's personal observations and the provider's statements to them. This document has been checked and approved by the attending provider.

## 2020-01-26 ENCOUNTER — Other Ambulatory Visit: Payer: Self-pay

## 2020-01-26 ENCOUNTER — Encounter: Payer: Self-pay | Admitting: Radiation Oncology

## 2020-01-26 ENCOUNTER — Ambulatory Visit
Admission: RE | Admit: 2020-01-26 | Discharge: 2020-01-26 | Disposition: A | Discharge status: Home / Self Care | Payer: Medicare PPO | Source: Ambulatory Visit | Attending: Radiation Oncology | Admitting: Radiation Oncology

## 2020-01-26 VITALS — BP 132/72 | HR 83 | Temp 98.0°F | Resp 18 | Ht 68.0 in | Wt 153.1 lb

## 2020-01-26 DIAGNOSIS — Z923 Personal history of irradiation: Secondary | ICD-10-CM | POA: Insufficient documentation

## 2020-01-26 DIAGNOSIS — Z8542 Personal history of malignant neoplasm of other parts of uterus: Secondary | ICD-10-CM | POA: Diagnosis not present

## 2020-01-26 DIAGNOSIS — C541 Malignant neoplasm of endometrium: Secondary | ICD-10-CM

## 2020-01-26 DIAGNOSIS — Z79899 Other long term (current) drug therapy: Secondary | ICD-10-CM | POA: Insufficient documentation

## 2020-01-26 NOTE — Progress Notes (Signed)
Sheila Armstrong presents today for f/u with Dr. Sondra Come. Pt denies c/o pain. Pt denies dysuria/hematuria. Pt denies vaginal bleeding/discharge. Pt denies rectal bleeding, diarrhea/constipation. Pt denies abdominal bloating, N/V.  BP 132/72 (BP Location: Left Arm, Patient Position: Sitting)   Pulse 83   Temp 98 F (36.7 C) (Temporal)   Resp 18   Ht 5\' 8"  (1.727 m)   Wt 153 lb 2 oz (69.5 kg)   SpO2 100%   BMI 23.28 kg/m   Wt Readings from Last 3 Encounters:  01/26/20 153 lb 2 oz (69.5 kg)  08/18/19 151 lb (68.5 kg)  07/20/19 150 lb 5 oz (68.2 kg)   Loma Sousa, RN BSN

## 2020-01-26 NOTE — Patient Instructions (Signed)
Coronavirus (COVID-19) Are you at risk?  Are you at risk for the Coronavirus (COVID-19)?  To be considered HIGH RISK for Coronavirus (COVID-19), you have to meet the following criteria:  . Traveled to [place], [place], [place], [place] or [place]; or in the [place] to [place], [place], [place], or [place]; and have fever, cough, and shortness of breath within the last 2 weeks of travel OR . Been in close contact with a person diagnosed with COVID-19 within the last 2 weeks and have fever, cough, and shortness of breath . IF YOU DO NOT MEET THESE CRITERIA, YOU ARE CONSIDERED LOW RISK FOR COVID-19.  What to do if you are HIGH RISK for COVID-19?  . If you are having a medical emergency, call 911. . Seek medical care right away. Before you go to a doctor's office, urgent care or emergency department, call ahead and tell them about your recent travel, contact with someone diagnosed with COVID-19, and your symptoms. You should receive instructions from your physician's office regarding next steps of care.  . When you arrive at healthcare provider, tell the healthcare staff immediately you have returned from visiting [place], [place], [place], [place] or [place]; or traveled in the [place] to [place], [place], [place], or [place]; in the last two weeks or you have been in close contact with a person diagnosed with COVID-19 in the last 2 weeks.   . Tell the health care staff about your symptoms: fever, cough and shortness of breath. . After you have been seen by a medical provider, you will be either: o Tested for (COVID-19) and discharged home on quarantine except to seek medical care if symptoms worsen, and asked to  - Stay home and avoid contact with others until you get your results (4-5 days)  - Avoid travel on public transportation if possible (such as bus, train, or airplane) or o Sent to the Emergency Department by EMS for evaluation, COVID-19 testing, and possible  admission depending on your condition and test results.  What to do if you are LOW RISK for COVID-19?  Reduce your risk of any infection by using the same precautions used for avoiding the common cold or flu:  . Wash your hands often with soap and warm water for at least 20 seconds.  If soap and water are not readily available, use an alcohol-based hand sanitizer with at least 60% alcohol.  . If coughing or sneezing, cover your mouth and nose by coughing or sneezing into the elbow areas of your shirt or coat, into a tissue or into your sleeve (not your hands). . Avoid shaking hands with others and consider head nods or verbal greetings only. . Avoid touching your eyes, nose, or mouth with unwashed hands.  . Avoid close contact with people who are sick. . Avoid places or events with large numbers of people in one location, like concerts or sporting events. . Carefully consider travel plans you have or are making. . If you are planning any travel outside or inside the [place], visit the CDC's Travelers' Health webpage for the latest health notices. . If you have some symptoms but not all symptoms, continue to monitor at home and seek medical attention if your symptoms worsen. . If you are having a medical emergency, call 911.   ADDITIONAL HEALTHCARE OPTIONS FOR PATIENTS  [facility] / e-Visit: [email or web address]         [facility]: [phone]  [facility]: [phone]                   [facility]: [phone]   

## 2020-05-10 ENCOUNTER — Telehealth: Payer: Self-pay | Admitting: *Deleted

## 2020-05-10 NOTE — Telephone Encounter (Signed)
Called patient to inform of fu with Dr. Berline Lopes on 08-01-20 - arrival time- 9 am, spoke with patient and she is aware of this appt.

## 2020-05-10 NOTE — Telephone Encounter (Signed)
Sheila Armstrong in radiation called and scheduled a follow up for Sept. She will call the patient with the appt

## 2020-06-19 ENCOUNTER — Ambulatory Visit: Payer: Medicare PPO | Admitting: Adult Health

## 2020-07-31 NOTE — Assessment & Plan Note (Addendum)
85 year old with Stage Ib grade 1 endometrial adenocarcinoma  Negative symptom review, normal exam.  No evidence of recurrence  >f/u w/rad-onc in 3/22 >Return in 9 mths

## 2020-07-31 NOTE — Progress Notes (Signed)
Follow Up Note: Gyn-Onc  Sheila Armstrong 85 y.o. female  CC:  Chief Complaint  Patient presents with  . Follow-up    endometrial cancer    HPI:  Oncology History  Endometrial cancer ([place])  02/24/2017 Initial Diagnosis   Endometrial cancer ([place]) pT1b, pN0, FIGO Stage IB Endometriod Adenocarcinoma    02/24/2017 Definitive Surgery   TRH-BSO, SNL  Final pathology showed a large lesion measuring 5.3 cm focally invades deep into the myometrium.  Cervix tubes ovaries and lymph nodes were free of disease.   04/07/2017 - 04/30/2017 Radiation Therapy   Vaginal cuff     Interval History: She denies any vaginal bleeding, abdominal/pelvic pain, cough, lethargy or abdominal distention.  No other complaints.    Review of Systems Review of Systems  Constitutional: Negative for malaise/fatigue.  Respiratory: Negative for cough.   Gastrointestinal: Negative for abdominal pain.  Genitourinary:       Negative for vaginal bleeding    Current Meds:  Outpatient Encounter Medications as of 08/01/2020  Medication Sig  . calcium-vitamin D (OSCAL WITH D) 500-200 MG-UNIT tablet Take 1 tablet by mouth daily with breakfast.  . EPINEPHrine 0.3 mg/0.3 mL IJ SOAJ injection INJ UTD PRF SYSTEMIC REACTIONS  . hydrocortisone 2.5 % ointment APPLY ON THE LIPS BID  . Multiple Vitamin (MULTIVITAMIN) capsule Take 1 capsule by mouth daily.  . NON FORMULARY Allergy Injections- weekly  . Probiotic Product (ALIGN) 4 MG CAPS Take 1 tablet by mouth daily.   No facility-administered encounter medications on file as of 08/01/2020.    Allergy:  Allergies  Allergen Reactions  . Sulfonamide Derivatives Hives    Social Hx:   Social History   Socioeconomic History  . Marital status: Married    Spouse name: Not on file  . Number of children: 2  . Years of education: Not on file  . Highest education level: Not on file  Occupational  History  . Occupation: Engineer, technical sales at [employer]  . Smoking status: Never Smoker  . Smokeless tobacco: Never Used  Vaping Use  . Vaping Use: Never used  Substance and Sexual Activity  . Alcohol use: No  . Drug use: No  . Sexual activity: Never    Partners: Male  Other Topics Concern  . Not on file  Social History Narrative   Married for 9 years    Two children ( both live locally)    Retired - [employer]       She likes to travel    Social Determinants of Radio broadcast assistant Strain:   . Difficulty of Paying Living Expenses: Not on file  Food Insecurity:   . Worried About Charity fundraiser in the Last Year: Not on file  . Ran Out of Food in the Last Year: Not on file  Transportation Needs:   . Lack of Transportation (Medical): Not on file  . Lack of Transportation (Non-Medical): Not on file  Physical Activity:   . Days of Exercise per Week: Not on file  . Minutes of Exercise per Session: Not on file  Stress:   . Feeling of Stress : Not on file  Social Connections:   . Frequency of Communication with Friends and Family: Not on file  . Frequency of Social Gatherings with Friends and Family: Not on file  . Attends Religious Services: Not on file  . Active Member of Clubs or Organizations: Not on file  . Attends Club or  Organization Meetings: Not on file  . Marital Status: Not on file  Intimate Partner Violence:   . Fear of Current or Ex-Partner: Not on file  . Emotionally Abused: Not on file  . Physically Abused: Not on file  . Sexually Abused: Not on file    Past Surgical Hx:  Past Surgical History:  Procedure Laterality Date  . BREAST BIOPSY    . COSMETIC SURGERY    . DILATION AND CURETTAGE OF UTERUS    . EYELID IMPLANT     right and left  . RHINOPLASTY    . ROBOTIC ASSISTED TOTAL HYSTERECTOMY WITH BILATERAL SALPINGO OOPHERECTOMY Bilateral 02/24/2017   Procedure: XI ROBOTIC ASSISTED TOTAL HYSTERECTOMY WITH BILATERAL  SALPINGO OOPHORECTOMY;  Surgeon: [name], MD;  Location: [facility];  Service: Gynecology;  Laterality: Bilateral;  . SENTINEL NODE BIOPSY N/A 02/24/2017   Procedure: SENTINEL NODE BIOPSY;  Surgeon: [name], MD;  Location: [facility];  Service: Gynecology;  Laterality: N/A;  . TONSILECTOMY/ADENOIDECTOMY WITH MYRINGOTOMY      Past Medical Hx:  Past Medical History:  Diagnosis Date  . Abnormal uterine bleeding   . ALLERGIC RHINITIS 07/07/2007  . COLONIC POLYPS, ADENOMATOUS, HX OF 11/19/2005  . CONSTIPATION 12/31/2009  . DIVERTICULOSIS, COLON 07/12/2008  . Endometrial cancer ([place])   . Family history of colon cancer   . Hearing loss   . History of radiation therapy 04/07/17-04/30/17   viaginal cuff treated to 30 Gy in 5 fractions  . SEBORRHEIC KERATOSIS, INFLAMED 03/21/2008  . SENILE VAGINITIS 01/17/2008    Family Hx:  Family History  Problem Relation Age of Onset  . Colon cancer Mother 72  . Heart disease Father   . Anuerysm Sister   . Esophageal cancer Neg Hx   . Stomach cancer Neg Hx     Vitals:  BP 136/76 (BP Location: Left Arm, Patient Position: Sitting)   Pulse 88   Resp 18   Ht 5\' 8"  (1.727 m)   Wt 150 lb 3.2 oz (68.1 kg)   SpO2 98%   BMI 22.84 kg/m    Physical Exam:  Physical Exam Exam conducted with a chaperone present.  Constitutional:      General: She is not in acute distress.    Appearance: Normal appearance.  Chest:     Chest wall: No tenderness.  Abdominal:     General: There is no distension.     Palpations: Abdomen is soft. There is no mass.     Tenderness: There is no abdominal tenderness.  Genitourinary:    General: Normal vulva.     Exam position: Lithotomy position.     Vagina: No lesions.     Adnexa:        Right: No mass or tenderness.         Left: No mass or tenderness.       Rectum: Normal.     Comments: Sclerotic appearing vagina w/petehiae-appearing areas.  No discrete lesions or palpable abnormality Lymphadenopathy:     Cervical:      Right cervical: No superficial cervical adenopathy.    Left cervical: No superficial cervical adenopathy.     Lower Body: No right inguinal adenopathy. No left inguinal adenopathy.  Neurological:     Mental Status: She is alert.     Assessment/Plan:  Problem List Items Addressed This Visit    Endometrial cancer [facility]) - Primary    85 year old with Stage Ib grade 1 endometrial adenocarcinoma  Negative symptom review, normal exam.  No evidence of recurrence  >f/u w/rad-onc in 3/22 >Return in 9 mths         I personally spent 25 minutes face-to-face and non-face-to-face in the care of this patient, which includes all pre, intra, and post visit time on the date of service.  Lahoma Crocker, MD 07/31/2020, 11:37 AM

## 2020-08-01 ENCOUNTER — Inpatient Hospital Stay: Payer: Medicare PPO | Attending: Obstetrics & Gynecology | Admitting: Obstetrics & Gynecology

## 2020-08-01 ENCOUNTER — Other Ambulatory Visit: Payer: Self-pay

## 2020-08-01 ENCOUNTER — Encounter: Payer: Self-pay | Admitting: Obstetrics & Gynecology

## 2020-08-01 VITALS — BP 136/76 | HR 88 | Resp 18 | Ht 68.0 in | Wt 150.2 lb

## 2020-08-01 DIAGNOSIS — C541 Malignant neoplasm of endometrium: Secondary | ICD-10-CM | POA: Insufficient documentation

## 2020-08-01 DIAGNOSIS — Z90722 Acquired absence of ovaries, bilateral: Secondary | ICD-10-CM | POA: Insufficient documentation

## 2020-08-01 DIAGNOSIS — Z90710 Acquired absence of both cervix and uterus: Secondary | ICD-10-CM | POA: Insufficient documentation

## 2020-08-01 DIAGNOSIS — Z923 Personal history of irradiation: Secondary | ICD-10-CM | POA: Insufficient documentation

## 2020-08-01 NOTE — Patient Instructions (Addendum)
Return in 9 months

## 2020-08-23 ENCOUNTER — Other Ambulatory Visit: Payer: Self-pay

## 2020-08-23 ENCOUNTER — Encounter: Payer: Self-pay | Admitting: Adult Health

## 2020-08-23 ENCOUNTER — Ambulatory Visit: Payer: Medicare PPO | Admitting: Adult Health

## 2020-08-23 VITALS — BP 100/40 | HR 86 | Temp 98.0°F | Ht 68.5 in | Wt 150.0 lb

## 2020-08-23 DIAGNOSIS — Z23 Encounter for immunization: Secondary | ICD-10-CM | POA: Diagnosis not present

## 2020-08-23 DIAGNOSIS — C541 Malignant neoplasm of endometrium: Secondary | ICD-10-CM | POA: Diagnosis not present

## 2020-08-23 DIAGNOSIS — Z0000 Encounter for general adult medical examination without abnormal findings: Secondary | ICD-10-CM | POA: Diagnosis not present

## 2020-08-23 DIAGNOSIS — Z1231 Encounter for screening mammogram for malignant neoplasm of breast: Secondary | ICD-10-CM | POA: Diagnosis not present

## 2020-08-23 DIAGNOSIS — J301 Allergic rhinitis due to pollen: Secondary | ICD-10-CM

## 2020-08-23 LAB — CBC WITH DIFFERENTIAL/PLATELET
Basophils Relative: 0.6 %
Lymphs Abs: 1084 cells/uL (ref 850–3900)
Monocytes Relative: 8.8 %
RDW: 12.7 % (ref 11.0–15.0)
Total Lymphocyte: 17.2 %

## 2020-08-23 NOTE — Progress Notes (Signed)
Subjective:    Patient ID: Sheila Armstrong, female    DOB: 11-08-1935, 85 y.o.   MRN: [id number]  HPI Patient presents for yearly preventative medicine examination. She is a pleasant and remarkable 85 year old female who  has a past medical history of Abnormal uterine bleeding, ALLERGIC RHINITIS (07/07/2007), COLONIC POLYPS, ADENOMATOUS, HX OF (11/19/2005), CONSTIPATION (12/31/2009), DIVERTICULOSIS, COLON (07/12/2008), Endometrial cancer ([place]), Family history of colon cancer, Hearing loss, History of radiation therapy (04/07/17-04/30/17), SEBORRHEIC KERATOSIS, INFLAMED (03/21/2008), and SENILE VAGINITIS (01/17/2008).  H/o Uterine Cancer-Diagnosed 2018 and underwent total robotic hysterectomy, bilateral salpingo-oophorectomy, and sentinel lymph nodes on February 24, 2017.  She continues to be followed by radiation oncology and GYN oncology.  She denies vaginal bleeding, abdominal/pelvic pain, abdominal distention or other complaints.  Seasonal Allergies - is seen by [facility] and receives steroid injections weekly.   All immunizations and health maintenance protocols were reviewed with the patient and needed orders were placed.  Appropriate screening laboratory values were ordered for the patient including screening of hyperlipidemia, renal function and hepatic function.  Medication reconciliation,  past medical history, social history, problem list and allergies were reviewed in detail with the patient  Goals were established with regard to weight loss, exercise, and  diet in compliance with medications. She stays active and eats healthy   Wt Readings from Last 3 Encounters:  08/23/20 150 lb (68 kg)  08/01/20 150 lb 3.2 oz (68.1 kg)  01/26/20 153 lb 2 oz (69.5 kg)   She is due for a mammogram and would like to continue to get these done.   She has no acute complaints   Review of Systems  Constitutional: Negative.   HENT: Negative.   Eyes: Negative.   Respiratory: Negative.     Cardiovascular: Negative.   Gastrointestinal: Negative.   Endocrine: Negative.   Genitourinary: Negative.   Musculoskeletal: Negative.   Skin: Negative.   Allergic/Immunologic: Negative.   Neurological: Negative.   Hematological: Negative.   Psychiatric/Behavioral: Negative.    Past Medical History:  Diagnosis Date   Abnormal uterine bleeding    ALLERGIC RHINITIS 07/07/2007   COLONIC POLYPS, ADENOMATOUS, HX OF 11/19/2005   CONSTIPATION 12/31/2009   DIVERTICULOSIS, COLON 07/12/2008   Endometrial cancer ([facility])    Family history of colon cancer    Hearing loss    History of radiation therapy 04/07/17-04/30/17   viaginal cuff treated to 30 Gy in 5 fractions   SEBORRHEIC KERATOSIS, INFLAMED 03/21/2008   SENILE VAGINITIS 01/17/2008    Social History   Socioeconomic History   Marital status: Married    Spouse name: Not on file   Number of children: 2   Years of education: Not on file   Highest education level: Not on file  Occupational History   Occupation: Engineer, 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   Smoking status: Never Smoker   Smokeless tobacco: Never Used  Scientific laboratory technician Use: Never used  Substance and Sexual Activity   Alcohol use: No   Drug use: No   Sexual activity: Never    Partners: Male  Other Topics Concern   Not on file  Social History Narrative   Married for 9 years    Two children ( both live locally)    Retired - Hydrologist - Chiropractor       She likes to travel    Social Determinants of Radio broadcast assistant Strain:    Difficulty of Paying Living Expenses: Not on  file  Food Insecurity:    Worried About Charity fundraiser in the Last Year: Not on file   [employer] of Food in the Last Year: Not on file  Transportation Needs:    Lack of Transportation (Medical): Not on file   Lack of Transportation (Non-Medical): Not on file  Physical Activity:    Days of Exercise per Week: Not on file   Minutes of Exercise per  Session: Not on file  Stress:    Feeling of Stress : Not on file  Social Connections:    Frequency of Communication with Friends and Family: Not on file   Frequency of Social Gatherings with Friends and Family: Not on file   Attends Religious Services: Not on file   Active Member of Clubs or Organizations: Not on file   Attends Archivist Meetings: Not on file   Marital Status: Not on file  Intimate Partner Violence:    Fear of Current or Ex-Partner: Not on file   Emotionally Abused: Not on file   Physically Abused: Not on file   Sexually Abused: Not on file    Past Surgical History:  Procedure Laterality Date   BREAST BIOPSY     COSMETIC SURGERY     DILATION AND CURETTAGE OF UTERUS     EYELID IMPLANT     right and left   RHINOPLASTY     ROBOTIC ASSISTED TOTAL HYSTERECTOMY WITH BILATERAL SALPINGO OOPHERECTOMY Bilateral 02/24/2017   Procedure: XI ROBOTIC ASSISTED TOTAL HYSTERECTOMY WITH BILATERAL SALPINGO OOPHORECTOMY;  Surgeon: [name], MD;  [facility];  Service: Gynecology;  Laterality: Bilateral;   SENTINEL NODE BIOPSY N/A 02/24/2017   Procedure: SENTINEL NODE BIOPSY;  Surgeon: [name], MD;  [facility];  Service: Gynecology;  Laterality: N/A;   TONSILECTOMY/ADENOIDECTOMY WITH MYRINGOTOMY      Family History  Problem Relation Age of Onset   Colon cancer Mother 47   Heart disease Father    Anuerysm Sister    Esophageal cancer Neg Hx    Stomach cancer Neg Hx     Allergies  Allergen Reactions   Sulfonamide Derivatives Hives   Thimerosal     Swelling of eyelids from eye drops    Current Outpatient Medications on File Prior to Visit  Medication Sig Dispense Refill   calcium-vitamin D (OSCAL WITH D) 500-200 MG-UNIT tablet Take 1 tablet by mouth daily with breakfast.     EPINEPHrine 0.3 mg/0.3 mL IJ SOAJ injection INJ UTD PRF SYSTEMIC REACTIONS  0   hydrocortisone 2.5 % ointment APPLY ON THE LIPS BID  1    Multiple Vitamin (MULTIVITAMIN) capsule Take 1 capsule by mouth daily.     NON FORMULARY Allergy Injections- weekly     Probiotic Product (ALIGN) 4 MG CAPS Take 1 tablet by mouth daily.     No current facility-administered medications on file prior to visit.    BP (!) 100/40 (BP Location: Left Arm, Patient Position: Sitting, Cuff Size: Normal)    Pulse 86    Temp 98 F (36.7 C) (Oral)    Ht 5' 8.5" (1.74 m)    Wt 150 lb (68 kg)    SpO2 98%    BMI 22.48 kg/m       Objective:   Physical Exam Vitals and nursing note reviewed.  Constitutional:      General: She is not in acute distress.    Appearance: Normal appearance. She is well-developed. She is not ill-appearing.  HENT:  Head: Normocephalic and atraumatic.     Right Ear: Tympanic membrane, ear canal and external ear normal. There is no impacted cerumen.     Left Ear: Tympanic membrane, ear canal and external ear normal. There is no impacted cerumen.     Nose: Nose normal. No congestion or rhinorrhea.     Mouth/Throat:     Mouth: Mucous membranes are moist.     Pharynx: Oropharynx is clear. No oropharyngeal exudate or posterior oropharyngeal erythema.     Comments: Significant gum recession   Eyes:     General:        Right eye: No discharge.        Left eye: No discharge.     Extraocular Movements: Extraocular movements intact.     Conjunctiva/sclera: Conjunctivae normal.     Pupils: Pupils are equal, round, and reactive to light.  Neck:     Thyroid: No thyromegaly.     Vascular: No carotid bruit.     Trachea: No tracheal deviation.  Cardiovascular:     Rate and Rhythm: Normal rate and regular rhythm.     Pulses: Normal pulses.     Heart sounds: Normal heart sounds. No murmur heard.  No friction rub. No gallop.   Pulmonary:     Effort: Pulmonary effort is normal. No respiratory distress.     Breath sounds: Normal breath sounds. No stridor. No wheezing, rhonchi or rales.  Chest:     Chest wall: No tenderness.    Abdominal:     General: Abdomen is flat. Bowel sounds are normal. There is no distension.     Palpations: Abdomen is soft. There is no mass.     Tenderness: There is no abdominal tenderness. There is no right CVA tenderness, left CVA tenderness, guarding or rebound.     Hernia: No hernia is present.  Musculoskeletal:        General: No swelling, tenderness, deformity or signs of injury. Normal range of motion.     Cervical back: Normal range of motion and neck supple.     Right lower leg: No edema.     Left lower leg: No edema.  Lymphadenopathy:     Cervical: No cervical adenopathy.  Skin:    General: Skin is warm and dry.     Coloration: Skin is not jaundiced or pale.     Findings: No bruising, erythema, lesion or rash.  Neurological:     General: No focal deficit present.     Mental Status: She is alert and oriented to person, place, and time.     Cranial Nerves: No cranial nerve deficit.     Sensory: No sensory deficit.     Motor: No weakness.     Coordination: Coordination normal.     Gait: Gait normal.     Deep Tendon Reflexes: Reflexes normal.  Psychiatric:        Mood and Affect: Mood normal.        Behavior: Behavior normal.        Thought Content: Thought content normal.        Judgment: Judgment normal.       Assessment & Plan:  1. Routine general medical examination at a health care facility - Benign exam  - Continue to stay active and exercise - Follow up in one year or sooner if needed - CBC with Differential/Platelet; Future - Hemoglobin A1c; Future - Lipid panel; Future - TSH; Future - CMP with eGFR([facility]); Future  2. Endometrial cancer (  [place]) - Follow up with GYN/Oncology and Radiation Oncology as directed   3. Seasonal allergic rhinitis due to pollen - Continue with [place] allergy and asthma   4. Screening mammogram for breast cancer  - MM DIGITAL SCREENING BILATERAL; Future

## 2020-08-23 NOTE — Patient Instructions (Signed)
It was great seeing you today   We will follow up with you regarding your labs  Continue to stay active and eat healthy   I will see you back in one year

## 2020-08-24 LAB — CBC WITH DIFFERENTIAL/PLATELET
Absolute Monocytes: 554 cells/uL (ref 200–950)
Basophils Absolute: 38 cells/uL (ref 0–200)
Eosinophils Absolute: 239 cells/uL (ref 15–500)
Eosinophils Relative: 3.8 %
HCT: 36.1 % (ref 35.0–45.0)
Hemoglobin: 12 g/dL (ref 11.7–15.5)
MCH: 31.3 pg (ref 27.0–33.0)
MCHC: 33.2 g/dL (ref 32.0–36.0)
MCV: 94.3 fL (ref 80.0–100.0)
MPV: 9.9 fL (ref 7.5–12.5)
Neutro Abs: 4385 cells/uL (ref 1500–7800)
Neutrophils Relative %: 69.6 %
Platelets: 239 10*3/uL (ref 140–400)
RBC: 3.83 10*6/uL (ref 3.80–5.10)
WBC: 6.3 10*3/uL (ref 3.8–10.8)

## 2020-08-24 LAB — COMPLETE METABOLIC PANEL WITH GFR
AG Ratio: 1.4 (calc) (ref 1.0–2.5)
ALT: 9 U/L (ref 6–29)
AST: 17 U/L (ref 10–35)
Albumin: 4.2 g/dL (ref 3.6–5.1)
Alkaline phosphatase (APISO): 51 U/L (ref 37–153)
BUN/Creatinine Ratio: 16 (calc) (ref 6–22)
BUN: 15 mg/dL (ref 7–25)
CO2: 24 mmol/L (ref 20–32)
Calcium: 9.8 mg/dL (ref 8.6–10.4)
Chloride: 104 mmol/L (ref 98–110)
Creat: 0.92 mg/dL — ABNORMAL HIGH (ref 0.60–0.88)
GFR, Est African American: 67 mL/min/{1.73_m2} (ref >=60)
GFR, Est Non African American: 58 mL/min/{1.73_m2} — ABNORMAL LOW (ref >=60)
Globulin: 3.1 g/dL (calc) (ref 1.9–3.7)
Glucose, Bld: 94 mg/dL (ref 65–99)
Potassium: 5 mmol/L (ref 3.5–5.3)
Sodium: 138 mmol/L (ref 135–146)
Total Bilirubin: 0.5 mg/dL (ref 0.2–1.2)
Total Protein: 7.3 g/dL (ref 6.1–8.1)

## 2020-08-24 LAB — LIPID PANEL
Cholesterol: 189 mg/dL (ref <200)
HDL: 47 mg/dL — ABNORMAL LOW (ref >=50)
LDL Cholesterol (Calc): 118 mg/dL (calc) — ABNORMAL HIGH
Non-HDL Cholesterol (Calc): 142 mg/dL (calc) — ABNORMAL HIGH (ref <130)
Total CHOL/HDL Ratio: 4 (calc) (ref <5.0)
Triglycerides: 129 mg/dL (ref <150)

## 2020-08-24 LAB — HEMOGLOBIN A1C
Hgb A1c MFr Bld: 5.6 % of total Hgb (ref <5.7)
Mean Plasma Glucose: 114 (calc)
eAG (mmol/L): 6.3 (calc)

## 2020-08-24 LAB — TSH: TSH: 2.28 mIU/L (ref 0.40–4.50)

## 2020-08-25 ENCOUNTER — Encounter: Payer: Self-pay | Admitting: Adult Health

## 2020-09-10 DIAGNOSIS — J301 Allergic rhinitis due to pollen: Secondary | ICD-10-CM | POA: Diagnosis not present

## 2020-09-10 DIAGNOSIS — J3089 Other allergic rhinitis: Secondary | ICD-10-CM | POA: Diagnosis not present

## 2020-09-18 DIAGNOSIS — J3089 Other allergic rhinitis: Secondary | ICD-10-CM | POA: Diagnosis not present

## 2020-09-18 DIAGNOSIS — J301 Allergic rhinitis due to pollen: Secondary | ICD-10-CM | POA: Diagnosis not present

## 2020-10-01 DIAGNOSIS — J3089 Other allergic rhinitis: Secondary | ICD-10-CM | POA: Diagnosis not present

## 2020-10-08 DIAGNOSIS — J301 Allergic rhinitis due to pollen: Secondary | ICD-10-CM | POA: Diagnosis not present

## 2020-10-08 DIAGNOSIS — J3089 Other allergic rhinitis: Secondary | ICD-10-CM | POA: Diagnosis not present

## 2020-10-15 DIAGNOSIS — J301 Allergic rhinitis due to pollen: Secondary | ICD-10-CM | POA: Diagnosis not present

## 2020-10-15 DIAGNOSIS — J3089 Other allergic rhinitis: Secondary | ICD-10-CM | POA: Diagnosis not present

## 2020-10-22 DIAGNOSIS — J3089 Other allergic rhinitis: Secondary | ICD-10-CM | POA: Diagnosis not present

## 2020-10-22 DIAGNOSIS — J301 Allergic rhinitis due to pollen: Secondary | ICD-10-CM | POA: Diagnosis not present

## 2020-10-29 DIAGNOSIS — J3089 Other allergic rhinitis: Secondary | ICD-10-CM | POA: Diagnosis not present

## 2020-10-29 DIAGNOSIS — J301 Allergic rhinitis due to pollen: Secondary | ICD-10-CM | POA: Diagnosis not present

## 2020-11-05 DIAGNOSIS — J3089 Other allergic rhinitis: Secondary | ICD-10-CM | POA: Diagnosis not present

## 2020-11-05 DIAGNOSIS — J301 Allergic rhinitis due to pollen: Secondary | ICD-10-CM | POA: Diagnosis not present

## 2020-11-11 ENCOUNTER — Encounter: Payer: Self-pay | Admitting: Adult Health

## 2020-11-12 DIAGNOSIS — J3089 Other allergic rhinitis: Secondary | ICD-10-CM | POA: Diagnosis not present

## 2020-11-13 ENCOUNTER — Other Ambulatory Visit: Payer: Self-pay | Admitting: Adult Health

## 2020-11-13 DIAGNOSIS — E785 Hyperlipidemia, unspecified: Secondary | ICD-10-CM

## 2020-11-13 NOTE — Addendum Note (Signed)
Addended by: Lerry Liner on: 11/13/2020 03:57 PM   Modules accepted: Orders

## 2020-11-14 ENCOUNTER — Other Ambulatory Visit: Payer: Self-pay

## 2020-11-14 ENCOUNTER — Other Ambulatory Visit (INDEPENDENT_AMBULATORY_CARE_PROVIDER_SITE_OTHER): Payer: Medicare PPO

## 2020-11-14 DIAGNOSIS — E785 Hyperlipidemia, unspecified: Secondary | ICD-10-CM | POA: Diagnosis not present

## 2020-11-14 LAB — LIPID PANEL
Cholesterol: 182 mg/dL (ref 0–200)
HDL: 44.4 mg/dL (ref >39.00)
LDL Cholesterol: 117 mg/dL — ABNORMAL HIGH (ref 0–99)
NonHDL: 137.58
Total CHOL/HDL Ratio: 4
Triglycerides: 101 mg/dL (ref 0.0–149.0)
VLDL: 20.2 mg/dL (ref 0.0–40.0)

## 2020-11-19 DIAGNOSIS — J3089 Other allergic rhinitis: Secondary | ICD-10-CM | POA: Diagnosis not present

## 2020-11-19 DIAGNOSIS — J301 Allergic rhinitis due to pollen: Secondary | ICD-10-CM | POA: Diagnosis not present

## 2020-11-29 DIAGNOSIS — J3089 Other allergic rhinitis: Secondary | ICD-10-CM | POA: Diagnosis not present

## 2020-12-03 DIAGNOSIS — J301 Allergic rhinitis due to pollen: Secondary | ICD-10-CM | POA: Diagnosis not present

## 2020-12-03 DIAGNOSIS — J3089 Other allergic rhinitis: Secondary | ICD-10-CM | POA: Diagnosis not present

## 2020-12-10 DIAGNOSIS — J3089 Other allergic rhinitis: Secondary | ICD-10-CM | POA: Diagnosis not present

## 2020-12-10 DIAGNOSIS — J301 Allergic rhinitis due to pollen: Secondary | ICD-10-CM | POA: Diagnosis not present

## 2020-12-17 DIAGNOSIS — J3089 Other allergic rhinitis: Secondary | ICD-10-CM | POA: Diagnosis not present

## 2020-12-17 DIAGNOSIS — J301 Allergic rhinitis due to pollen: Secondary | ICD-10-CM | POA: Diagnosis not present

## 2020-12-24 DIAGNOSIS — J3081 Allergic rhinitis due to animal (cat) (dog) hair and dander: Secondary | ICD-10-CM | POA: Diagnosis not present

## 2020-12-24 DIAGNOSIS — J301 Allergic rhinitis due to pollen: Secondary | ICD-10-CM | POA: Diagnosis not present

## 2020-12-24 DIAGNOSIS — J3089 Other allergic rhinitis: Secondary | ICD-10-CM | POA: Diagnosis not present

## 2021-01-01 DIAGNOSIS — J301 Allergic rhinitis due to pollen: Secondary | ICD-10-CM | POA: Diagnosis not present

## 2021-01-01 DIAGNOSIS — J3089 Other allergic rhinitis: Secondary | ICD-10-CM | POA: Diagnosis not present

## 2021-01-03 DIAGNOSIS — J3089 Other allergic rhinitis: Secondary | ICD-10-CM | POA: Diagnosis not present

## 2021-01-03 DIAGNOSIS — J301 Allergic rhinitis due to pollen: Secondary | ICD-10-CM | POA: Diagnosis not present

## 2021-01-07 DIAGNOSIS — J3089 Other allergic rhinitis: Secondary | ICD-10-CM | POA: Diagnosis not present

## 2021-01-07 DIAGNOSIS — J301 Allergic rhinitis due to pollen: Secondary | ICD-10-CM | POA: Diagnosis not present

## 2021-01-11 DIAGNOSIS — J3089 Other allergic rhinitis: Secondary | ICD-10-CM | POA: Diagnosis not present

## 2021-01-11 DIAGNOSIS — J301 Allergic rhinitis due to pollen: Secondary | ICD-10-CM | POA: Diagnosis not present

## 2021-01-14 DIAGNOSIS — J3089 Other allergic rhinitis: Secondary | ICD-10-CM | POA: Diagnosis not present

## 2021-01-14 DIAGNOSIS — J301 Allergic rhinitis due to pollen: Secondary | ICD-10-CM | POA: Diagnosis not present

## 2021-01-21 DIAGNOSIS — J301 Allergic rhinitis due to pollen: Secondary | ICD-10-CM | POA: Diagnosis not present

## 2021-01-21 DIAGNOSIS — J3089 Other allergic rhinitis: Secondary | ICD-10-CM | POA: Diagnosis not present

## 2021-01-28 ENCOUNTER — Telehealth: Payer: Self-pay | Admitting: *Deleted

## 2021-01-28 NOTE — Telephone Encounter (Signed)
Called patient to inform that Dr. Sondra Come will be in the OR on 01-31-21, fu appt. has been moved to 02-14-21 @ 11:30 am, lvm for a return call

## 2021-01-29 DIAGNOSIS — J3089 Other allergic rhinitis: Secondary | ICD-10-CM | POA: Diagnosis not present

## 2021-01-31 ENCOUNTER — Ambulatory Visit: Payer: Self-pay | Admitting: Radiation Oncology

## 2021-02-04 DIAGNOSIS — J3089 Other allergic rhinitis: Secondary | ICD-10-CM | POA: Diagnosis not present

## 2021-02-04 DIAGNOSIS — J3081 Allergic rhinitis due to animal (cat) (dog) hair and dander: Secondary | ICD-10-CM | POA: Diagnosis not present

## 2021-02-04 DIAGNOSIS — J301 Allergic rhinitis due to pollen: Secondary | ICD-10-CM | POA: Diagnosis not present

## 2021-02-11 DIAGNOSIS — J3089 Other allergic rhinitis: Secondary | ICD-10-CM | POA: Diagnosis not present

## 2021-02-11 DIAGNOSIS — J301 Allergic rhinitis due to pollen: Secondary | ICD-10-CM | POA: Diagnosis not present

## 2021-02-13 ENCOUNTER — Ambulatory Visit (INDEPENDENT_AMBULATORY_CARE_PROVIDER_SITE_OTHER): Payer: Medicare PPO | Admitting: Otolaryngology

## 2021-02-13 ENCOUNTER — Other Ambulatory Visit: Payer: Self-pay

## 2021-02-13 ENCOUNTER — Encounter (INDEPENDENT_AMBULATORY_CARE_PROVIDER_SITE_OTHER): Payer: Self-pay | Admitting: Otolaryngology

## 2021-02-13 VITALS — Temp 97.5°F

## 2021-02-13 DIAGNOSIS — H6123 Impacted cerumen, bilateral: Secondary | ICD-10-CM | POA: Diagnosis not present

## 2021-02-13 DIAGNOSIS — H903 Sensorineural hearing loss, bilateral: Secondary | ICD-10-CM

## 2021-02-13 NOTE — Progress Notes (Signed)
HPI: Sheila Armstrong is a 86 y.o. female who presents for evaluation of wax buildup in her ears.  She wears bilateral hearing aids.  The left ear canal is obstructed.  She was last cleaned about 2 years ago..  Past Medical History:  Diagnosis Date  . Abnormal uterine bleeding   . ALLERGIC RHINITIS 07/07/2007  . COLONIC POLYPS, ADENOMATOUS, HX OF 11/19/2005  . CONSTIPATION 12/31/2009  . DIVERTICULOSIS, COLON 07/12/2008  . Endometrial cancer ([place])   . Family history of colon cancer   . Hearing loss   . History of radiation therapy 04/07/17-04/30/17   viaginal cuff treated to 30 Gy in 5 fractions  . History of radiation therapy 04/07/17,04/14/17,04/21/17,04/30/17   endometrial- Dr. Sondra Come   . SEBORRHEIC KERATOSIS, INFLAMED 03/21/2008  . SENILE VAGINITIS 01/17/2008   Past Surgical History:  Procedure Laterality Date  . BREAST BIOPSY    . COSMETIC SURGERY    . DILATION AND CURETTAGE OF UTERUS    . EYELID IMPLANT     right and left  . RHINOPLASTY    . ROBOTIC ASSISTED TOTAL HYSTERECTOMY WITH BILATERAL SALPINGO OOPHERECTOMY Bilateral 02/24/2017   Procedure: XI ROBOTIC ASSISTED TOTAL HYSTERECTOMY WITH BILATERAL SALPINGO OOPHORECTOMY;  Surgeon: [name], MD;  [facility];  Service: Gynecology;  Laterality: Bilateral;  . SENTINEL NODE BIOPSY N/A 02/24/2017   Procedure: SENTINEL NODE BIOPSY;  Surgeon: [name], MD;  [facility];  Service: Gynecology;  Laterality: N/A;  . TONSILECTOMY/ADENOIDECTOMY WITH MYRINGOTOMY     Social History   Socioeconomic History  . Marital status: Married    Spouse name: Not on file  . Number of children: 2  . Years of education: Not on file  . Highest education level: Not on file  Occupational History  . Occupation: Engineer, technical sales at [employer]  . Smoking status: Never Smoker  . Smokeless tobacco: Never Used  Vaping Use  . Vaping Use: Never used  Substance and Sexual Activity  . Alcohol use: No  . Drug use: No  . Sexual  activity: Never    Partners: Male  Other Topics Concern  . Not on file  Social History Narrative   Married for 9 years    Two children ( both live locally)    Retired - Hydrologist - Chiropractor       She likes to travel    Social Determinants of Radio broadcast assistant Strain: Not on [employer] Insecurity: Not on file  Transportation Needs: Not on file  Physical Activity: Not on file  Stress: Not on file  Social Connections: Not on file   Family History  Problem Relation Age of Onset  . Colon cancer Mother 67  . Heart disease Father   . Anuerysm Sister   . Esophageal cancer Neg Hx   . Stomach cancer Neg Hx    Allergies  Allergen Reactions  . Sulfonamide Derivatives Hives  . Thimerosal     Swelling of eyelids from eye drops   Prior to Admission medications   Medication Sig Start Date End Date Taking? Authorizing Provider  calcium-vitamin D (OSCAL WITH D) 500-200 MG-UNIT tablet Take 1 tablet by mouth daily with breakfast.    [provider]  EPINEPHrine 0.3 mg/0.3 mL IJ SOAJ injection INJ UTD PRF SYSTEMIC REACTIONS 11/13/16   [provider]  hydrocortisone 2.5 % ointment APPLY ON THE LIPS BID 12/03/17   [provider]  Multiple Vitamin (MULTIVITAMIN) capsule Take 1 capsule by  mouth daily.    [provider]  NON FORMULARY Allergy Injections- weekly    [provider]  Probiotic Product (ALIGN) 4 MG CAPS Take 1 tablet by mouth daily. 05/04/12   Esterwood, Amy S, PA-C     Positive ROS: Otherwise negative  All other systems have been reviewed and were otherwise negative with the exception of those mentioned in the HPI and as above.  Physical Exam: Constitutional: Alert, well-appearing, no acute distress Ears: External ears without lesions or tenderness. Ear canals moderate mild wax on the right side that was nonobstructing.  The left ear canal was completely occluded with cerumen that was obstructing her hearing aid.  This was  cleaned with curette and forceps.  Ear canals and TMs were otherwise clear.. Nasal: External nose without lesions. Clear nasal passages Oral: Oropharynx clear. Neck: No palpable adenopathy or masses Respiratory: Breathing comfortably  Skin: No facial/neck lesions or rash noted.  Cerumen impaction removal  Date/Time: 02/13/2021 11:31 AM Performed by: Rozetta Nunnery, MD Authorized by: Rozetta Nunnery, MD   Consent:    Consent obtained:  Verbal   Consent given by:  Patient   Risks discussed:  Pain and bleeding Procedure details:    Location:  L ear and R ear   Procedure type: curette and forceps   Post-procedure details:    Inspection:  TM intact and canal normal   Hearing quality:  Improved   Patient tolerance of procedure:  Tolerated well, no immediate complications Comments:     TMs are clear bilaterally.    Assessment: Cerumen impaction on the left side and patient who wears hearing aids.  Plan: This was cleaned in the office with improved hearing. She will follow-up as needed.  Radene Journey, MD with stenosis open deep cervical node acid 3510 I just I would Certainly lymph node uncertain behavior whenever without lymph nodes.  Uncertain as to [place] when she is referred

## 2021-02-14 ENCOUNTER — Other Ambulatory Visit: Payer: Self-pay

## 2021-02-14 ENCOUNTER — Encounter: Payer: Self-pay | Admitting: Radiation Oncology

## 2021-02-14 ENCOUNTER — Ambulatory Visit
Admission: RE | Admit: 2021-02-14 | Discharge: 2021-02-14 | Disposition: A | Discharge status: Home / Self Care | Payer: Medicare PPO | Source: Ambulatory Visit | Attending: Radiation Oncology | Admitting: Radiation Oncology

## 2021-02-14 VITALS — BP 136/79 | HR 74 | Temp 97.6°F | Resp 18 | Ht 68.0 in | Wt 141.8 lb

## 2021-02-14 DIAGNOSIS — C541 Malignant neoplasm of endometrium: Secondary | ICD-10-CM

## 2021-02-14 DIAGNOSIS — Z923 Personal history of irradiation: Secondary | ICD-10-CM | POA: Diagnosis not present

## 2021-02-14 DIAGNOSIS — Z8542 Personal history of malignant neoplasm of other parts of uterus: Secondary | ICD-10-CM | POA: Insufficient documentation

## 2021-02-14 DIAGNOSIS — Z08 Encounter for follow-up examination after completed treatment for malignant neoplasm: Secondary | ICD-10-CM | POA: Diagnosis not present

## 2021-02-14 NOTE — Progress Notes (Signed)
She is currently in no pain.  Patient complains of None. Reports none None.    Patient states they urinate 1 - 2 times per night.   Patient reports None, a bowel movement every day.   Patient reports None.    Patient is using their vaginal dilator 3 times weekly for 15 minutes.  BP 136/79 (BP Location: Left Arm, Patient Position: Sitting, Cuff Size: Normal)   Pulse 74   Temp 97.6 F (36.4 C)   Resp 18   Ht 5\' 8"  (1.727 m)   Wt 141 lb 12.8 oz (64.3 kg)   SpO2 100%   BMI 21.56 kg/m

## 2021-02-14 NOTE — Progress Notes (Signed)
Radiation Oncology         [phone] ________________________________  Name: Sheila Armstrong MRN: [id number]  Date: 02/14/2021  DOB: [birth year]  Follow-Up Visit Note  CC: Dorothyann Peng, NP  [name], MD    ICD-10-CM   1. Endometrial cancer (HCC)  C54.1     Diagnosis:   86 y.o. female with pT1b, pN0, FIGO Stage IB EndometriodAdenocarcinoma   Interval Since Last Radiation: Three years, nine months, two weeks, and three days  Radiation treatment dates:   04/07/17, 04/14/17, 04/21/17, 04/28/17, 04/30/17  Site/dose:   Vaginal Cuff treated to 30 Gy in 5 fractions  Narrative:  The patient returns today for routine follow-up. Since her lat visit, she was seen by Dr. Delsa Sale on 07/31/2020. There was no evidence of recurrence at that time.  On review of systems, she reports no new medical issues. She denies pelvic pain vaginal bleeding or discharge.  She denies any abdominal bloating urination difficulties or bowel complaints.  She denies any hematuria or rectal bleeding.  She continues to use her vaginal dilator as recommended..  ALLERGIES:  is allergic to sulfonamide derivatives, sulfur, thimerosal, and sudafed [pseudoephedrine].  Meds: Current Outpatient Medications  Medication Sig Dispense Refill  . calcium-vitamin D (OSCAL WITH D) 500-200 MG-UNIT tablet Take 1 tablet by mouth daily with breakfast.    . EPINEPHrine 0.3 mg/0.3 mL IJ SOAJ injection INJ UTD PRF SYSTEMIC REACTIONS  0  . hydrocortisone 2.5 % ointment APPLY ON THE LIPS BID  1  . Multiple Vitamin (MULTIVITAMIN) capsule Take 1 capsule by mouth daily.    . NON FORMULARY Allergy Injections- weekly    . Probiotic Product (ALIGN) 4 MG CAPS Take 1 tablet by mouth daily.     No current facility-administered medications for this encounter.    Physical Findings: The patient is in no acute distress. Patient is alert and oriented.  height is 5\' 8"  (1.727 m) and weight is 141 lb 12.8 oz (64.3 kg). Her temperature is  97.6 F (36.4 C). Her blood pressure is 136/79 and her pulse is 74. Her respiration is 18 and oxygen saturation is 100%.   Lungs are clear to auscultation bilaterally. Heart has regular rate and rhythm. No palpable cervical, supraclavicular, or axillary adenopathy. Abdomen soft, non-tender, normal bowel sounds.  On pelvic examination the external genitalia were unremarkable. A speculum exam was performed. There are no mucosal lesions noted in the vaginal vault. On bimanual examination and rectovaginal examination there were no pelvic masses appreciated. Vaginal cuff intact.   Lab Findings: Lab Results  Component Value Date   WBC 6.3 08/23/2020   HGB 12.0 08/23/2020   HCT 36.1 08/23/2020   MCV 94.3 08/23/2020   PLT 239 08/23/2020    Radiographic Findings: No results found.  Impression:  pT1b, pN0, FIGO Stage IB EndometrioidAdenocarcinoma.   No evidence of recurrence on clinical exam today.   Plan:  The patient will follow up with GYN-Oncology in six months and with radiation oncology in one year.  Total time spent in this encounter was 20 minutes which included reviewing the patient's most recent follow-up, physical examination, and documentation.  ____________________________________  Blair Promise, PhD, MD  This document serves as a record of services personally performed by Gery Pray, MD. It was created on his behalf by Clerance Lav, a trained medical scribe. The creation of this record is based on the scribe's personal observations and the provider's statements to them. This document has been checked and approved by the  attending provider.

## 2021-02-18 DIAGNOSIS — J301 Allergic rhinitis due to pollen: Secondary | ICD-10-CM | POA: Diagnosis not present

## 2021-02-18 DIAGNOSIS — J3089 Other allergic rhinitis: Secondary | ICD-10-CM | POA: Diagnosis not present

## 2021-02-26 DIAGNOSIS — J3081 Allergic rhinitis due to animal (cat) (dog) hair and dander: Secondary | ICD-10-CM | POA: Diagnosis not present

## 2021-02-26 DIAGNOSIS — J3089 Other allergic rhinitis: Secondary | ICD-10-CM | POA: Diagnosis not present

## 2021-02-26 DIAGNOSIS — J301 Allergic rhinitis due to pollen: Secondary | ICD-10-CM | POA: Diagnosis not present

## 2021-03-08 DIAGNOSIS — J301 Allergic rhinitis due to pollen: Secondary | ICD-10-CM | POA: Diagnosis not present

## 2021-03-08 DIAGNOSIS — J3089 Other allergic rhinitis: Secondary | ICD-10-CM | POA: Diagnosis not present

## 2021-03-18 DIAGNOSIS — J301 Allergic rhinitis due to pollen: Secondary | ICD-10-CM | POA: Diagnosis not present

## 2021-03-18 DIAGNOSIS — J3089 Other allergic rhinitis: Secondary | ICD-10-CM | POA: Diagnosis not present

## 2021-03-25 DIAGNOSIS — J3089 Other allergic rhinitis: Secondary | ICD-10-CM | POA: Diagnosis not present

## 2021-04-02 DIAGNOSIS — J3089 Other allergic rhinitis: Secondary | ICD-10-CM | POA: Diagnosis not present

## 2021-04-02 DIAGNOSIS — J301 Allergic rhinitis due to pollen: Secondary | ICD-10-CM | POA: Diagnosis not present

## 2021-04-03 ENCOUNTER — Encounter: Payer: Self-pay | Admitting: Adult Health

## 2021-04-09 DIAGNOSIS — J301 Allergic rhinitis due to pollen: Secondary | ICD-10-CM | POA: Diagnosis not present

## 2021-04-09 DIAGNOSIS — J3089 Other allergic rhinitis: Secondary | ICD-10-CM | POA: Diagnosis not present

## 2021-04-15 DIAGNOSIS — J3089 Other allergic rhinitis: Secondary | ICD-10-CM | POA: Diagnosis not present

## 2021-04-15 DIAGNOSIS — J301 Allergic rhinitis due to pollen: Secondary | ICD-10-CM | POA: Diagnosis not present

## 2021-04-22 DIAGNOSIS — J3089 Other allergic rhinitis: Secondary | ICD-10-CM | POA: Diagnosis not present

## 2021-04-22 DIAGNOSIS — J301 Allergic rhinitis due to pollen: Secondary | ICD-10-CM | POA: Diagnosis not present

## 2021-04-30 DIAGNOSIS — J301 Allergic rhinitis due to pollen: Secondary | ICD-10-CM | POA: Diagnosis not present

## 2021-04-30 DIAGNOSIS — J3089 Other allergic rhinitis: Secondary | ICD-10-CM | POA: Diagnosis not present

## 2021-05-03 ENCOUNTER — Telehealth: Payer: Self-pay

## 2021-05-03 ENCOUNTER — Telehealth: Payer: Self-pay | Admitting: *Deleted

## 2021-05-03 NOTE — Telephone Encounter (Signed)
CALLED PATIENT TO INFORM OF FU APPT. WITH DR. [place] ON 08-14-21 - ARRIVAL TIME- 8:30 AM, SPOKE WITH PATIENT AND SHE IS AWARE OF THIS APPT.

## 2021-05-03 NOTE — Telephone Encounter (Signed)
Received phone call from [facility] requesting an appointment with Dr. Delsa Sale for Sheila Armstrong in October. Appointment has been scheduled for 08/14/21 at 0900. Enid Derry will notify pt. of day and time.

## 2021-05-06 DIAGNOSIS — J301 Allergic rhinitis due to pollen: Secondary | ICD-10-CM | POA: Diagnosis not present

## 2021-05-06 DIAGNOSIS — J3089 Other allergic rhinitis: Secondary | ICD-10-CM | POA: Diagnosis not present

## 2021-05-14 DIAGNOSIS — M5459 Other low back pain: Secondary | ICD-10-CM | POA: Diagnosis not present

## 2021-05-14 DIAGNOSIS — M545 Low back pain, unspecified: Secondary | ICD-10-CM | POA: Diagnosis not present

## 2021-05-15 DIAGNOSIS — J3089 Other allergic rhinitis: Secondary | ICD-10-CM | POA: Diagnosis not present

## 2021-05-15 DIAGNOSIS — J301 Allergic rhinitis due to pollen: Secondary | ICD-10-CM | POA: Diagnosis not present

## 2021-05-20 DIAGNOSIS — J3089 Other allergic rhinitis: Secondary | ICD-10-CM | POA: Diagnosis not present

## 2021-05-20 DIAGNOSIS — J301 Allergic rhinitis due to pollen: Secondary | ICD-10-CM | POA: Diagnosis not present

## 2021-05-28 DIAGNOSIS — J301 Allergic rhinitis due to pollen: Secondary | ICD-10-CM | POA: Diagnosis not present

## 2021-05-28 DIAGNOSIS — J3089 Other allergic rhinitis: Secondary | ICD-10-CM | POA: Diagnosis not present

## 2021-05-31 ENCOUNTER — Ambulatory Visit
Admission: RE | Admit: 2021-05-31 | Discharge: 2021-05-31 | Disposition: A | Discharge status: Home / Self Care | Payer: Medicare PPO | Source: Ambulatory Visit | Attending: Adult Health | Admitting: Adult Health

## 2021-05-31 ENCOUNTER — Other Ambulatory Visit: Payer: Self-pay

## 2021-05-31 DIAGNOSIS — Z1231 Encounter for screening mammogram for malignant neoplasm of breast: Secondary | ICD-10-CM

## 2021-06-09 ENCOUNTER — Encounter: Payer: Self-pay | Admitting: Adult Health

## 2021-06-10 ENCOUNTER — Encounter: Payer: Self-pay | Admitting: Family Medicine

## 2021-06-10 ENCOUNTER — Telehealth (INDEPENDENT_AMBULATORY_CARE_PROVIDER_SITE_OTHER): Payer: Medicare PPO | Admitting: Family Medicine

## 2021-06-10 VITALS — Ht 68.0 in

## 2021-06-10 DIAGNOSIS — U071 COVID-19: Secondary | ICD-10-CM | POA: Diagnosis not present

## 2021-06-10 MED ORDER — MOLNUPIRAVIR EUA 200MG CAPSULE: 4.0000 | ORAL_CAPSULE | Freq: Two times a day (BID) | ORAL | 0 refills | Status: AC

## 2021-06-10 NOTE — Progress Notes (Signed)
Virtual Visit via Telephone Note I connected with Sheila Armstrong on 06/10/21 at  3:00 PM EDT by telephone and verified that I am speaking with the correct person using two identifiers.   I discussed the limitations, risks, security and privacy concerns of performing an evaluation and management service by telephone and the availability of in person appointments. I also discussed with the patient that there may be a patient responsible charge related to this service. The patient expressed understanding and agreed to proceed.  Location patient: home Location provider: work office Participants present for the call: patient, provider Patient did not have a visit in the prior 7 days to address this/these issue(s).  Chief Complaint  Patient presents with   Covid Positive   History of Present Illness: Sheila Armstrong is a 86 y.o.female with hx of allergy rhinitis, endometrial carcinoma s/p radiation treatment 3 years ago complaining of 4 days of respiratory and GI symptoms.  Fatigue, body aches,chills,sore throat, rhinorrhea, nasal congestion, productive cough, nausea, intermittent generalized abdominal pain, and diarrhea. She has not had diarrhea today or yesterday. Negative for hemoptysis.  Nausea and abdominal pain have improved. Negative for fever, anosmia,ageusia,CP, wheezing, dyspnea, vomiting, blood/mucus in stool, melena, urinary symptoms, or skin rash.  She has taking Tylenol. Some symptoms have improved.  COVID-19 test positive yesterday, the day before she had a home COVID-19 test negative. No sick contact. COVID-19 vaccination completed.  Observations/Objective: Patient sounds cheerful and well on the phone. I do not appreciate any SOB. Speech and thought processing are grossly intact. Patient reported vitals:Ht '5\' 8"'$  (1.727 m)   BMI 21.56 kg/m   Assessment and Plan: 1. COVID-19 virus infection We discussed diagnosis, possible complications, and available treatment  options. She has a mild case with some risk for complications due to her age. After discussing oral antiviral option, she would like to try medication. We reviewed side effects. Symptomatic treatment with oral fluids, Tylenol 500 mg 3-4 times per day as needed, and rest recommended. She does not feel that she needs something for nausea, it has improved. She lives alone but has somebody checking on her a few times per day. Clearly instructed about warning signs.  - molnupiravir EUA 200 mg CAPS; Take 4 capsules (800 mg total) by mouth 2 (two) times daily for 5 days.  Dispense: 40 capsule; Refill: 0  Follow Up Instructions:  Return if symptoms worsen or fail to improve.  I did not refer this patient for an OV in the next 24 hours for this/these issue(s).  I discussed the assessment and treatment plan with the patient.  Sheila Armstrong was provided an opportunity to ask questions and all were answered.  She agreed with the plan and demonstrated an understanding of the instructions.   The patient was advised to call back or seek an in-person evaluation if the symptoms worsen or if the condition fails to improve as anticipated.  I provided 13 minutes of non-face-to-face time during this encounter.   Carlynn Leduc [place], MD

## 2021-06-18 ENCOUNTER — Telehealth: Payer: Self-pay

## 2021-06-18 NOTE — Telephone Encounter (Signed)
Patient called [facility] requesting a second opinion on her cholesterol.  She spoke with Wendall Stade.  Stated that she did not want to complete a transfer of care from [facility]. Stated she has called [facility] but was not able to get her concern addressed.   Tami informed her that we would reach out to the office manager at [place] in regard to her concern.  Call was then disconnected.  After call, I seen that patient had scheduled a new patient appointment with Maximiano Coss through open scheduling.  I phoned patient back to inform her that she was an existing patient of [facility], Therefore we would have to cancel the appointment she made through open scheduling and request a transfer of care.   Patient stated she did not want a TOC or NP appt.  Only a second opinion.  Patient stated that her family has not been happy with the treatment options Dorothyann Peng has given in regard to cholesterol.   Stated she believes this has been the cause of her loosing 15 pounds that she did not need to loose.    Patient stated she has just gotten over covid and surgery and has had a lot going on.    Please follow up in regard.

## 2021-06-24 DIAGNOSIS — J3089 Other allergic rhinitis: Secondary | ICD-10-CM | POA: Diagnosis not present

## 2021-06-24 DIAGNOSIS — J301 Allergic rhinitis due to pollen: Secondary | ICD-10-CM | POA: Diagnosis not present

## 2021-06-27 DIAGNOSIS — J3089 Other allergic rhinitis: Secondary | ICD-10-CM | POA: Diagnosis not present

## 2021-06-27 DIAGNOSIS — J301 Allergic rhinitis due to pollen: Secondary | ICD-10-CM | POA: Diagnosis not present

## 2021-07-01 DIAGNOSIS — J3089 Other allergic rhinitis: Secondary | ICD-10-CM | POA: Diagnosis not present

## 2021-07-01 DIAGNOSIS — J301 Allergic rhinitis due to pollen: Secondary | ICD-10-CM | POA: Diagnosis not present

## 2021-07-03 DIAGNOSIS — J301 Allergic rhinitis due to pollen: Secondary | ICD-10-CM | POA: Diagnosis not present

## 2021-07-03 DIAGNOSIS — J3089 Other allergic rhinitis: Secondary | ICD-10-CM | POA: Diagnosis not present

## 2021-07-08 DIAGNOSIS — J3089 Other allergic rhinitis: Secondary | ICD-10-CM | POA: Diagnosis not present

## 2021-07-08 DIAGNOSIS — J301 Allergic rhinitis due to pollen: Secondary | ICD-10-CM | POA: Diagnosis not present

## 2021-07-16 DIAGNOSIS — J301 Allergic rhinitis due to pollen: Secondary | ICD-10-CM | POA: Diagnosis not present

## 2021-07-16 DIAGNOSIS — J3089 Other allergic rhinitis: Secondary | ICD-10-CM | POA: Diagnosis not present

## 2021-07-22 DIAGNOSIS — J3089 Other allergic rhinitis: Secondary | ICD-10-CM | POA: Diagnosis not present

## 2021-07-22 DIAGNOSIS — J301 Allergic rhinitis due to pollen: Secondary | ICD-10-CM | POA: Diagnosis not present

## 2021-07-25 ENCOUNTER — Ambulatory Visit: Payer: Medicare PPO | Admitting: Registered Nurse

## 2021-07-29 DIAGNOSIS — J3089 Other allergic rhinitis: Secondary | ICD-10-CM | POA: Diagnosis not present

## 2021-07-29 DIAGNOSIS — J301 Allergic rhinitis due to pollen: Secondary | ICD-10-CM | POA: Diagnosis not present

## 2021-08-05 DIAGNOSIS — J301 Allergic rhinitis due to pollen: Secondary | ICD-10-CM | POA: Diagnosis not present

## 2021-08-05 DIAGNOSIS — J3089 Other allergic rhinitis: Secondary | ICD-10-CM | POA: Diagnosis not present

## 2021-08-12 DIAGNOSIS — J3089 Other allergic rhinitis: Secondary | ICD-10-CM | POA: Diagnosis not present

## 2021-08-12 DIAGNOSIS — J301 Allergic rhinitis due to pollen: Secondary | ICD-10-CM | POA: Diagnosis not present

## 2021-08-14 ENCOUNTER — Ambulatory Visit: Payer: Medicare PPO | Admitting: Obstetrics & Gynecology

## 2021-08-15 ENCOUNTER — Telehealth: Payer: Self-pay | Admitting: *Deleted

## 2021-08-15 NOTE — Telephone Encounter (Signed)
Called the patient and moved appt time from 9 am on 10/12 to 12:15 pm

## 2021-08-19 DIAGNOSIS — J301 Allergic rhinitis due to pollen: Secondary | ICD-10-CM | POA: Diagnosis not present

## 2021-08-19 DIAGNOSIS — J3089 Other allergic rhinitis: Secondary | ICD-10-CM | POA: Diagnosis not present

## 2021-08-20 NOTE — Assessment & Plan Note (Addendum)
86 year old with Stage Ib grade 1 endometrial adenocarcinoma  Negative symptom review, normal exam.  No evidence of recurrence  >f/u w/RAD-ONC in 4/23 >return prn or in 1 yr

## 2021-08-20 NOTE — Progress Notes (Signed)
Follow Up Note: Gyn-Onc  Sheila Armstrong 86 y.o. female  CC: She presents for a follow-up visit   HPI:  Oncology History  Endometrial cancer ([place])  02/24/2017 Initial Diagnosis   Endometrial cancer ([place]) pT1b, pN0, FIGO Stage IB Endometriod Adenocarcinoma    02/24/2017 Definitive Surgery   TRH-BSO, SNL  Final pathology showed a large lesion measuring 5.3 cm focally invades deep into the myometrium.  Cervix tubes ovaries and lymph nodes were free of disease.   04/07/2017 - 04/30/2017 Radiation Therapy   Vaginal cuff     Interval History: Since her last visit, she was seen by Gery Pray, MD.  There was no evidence of recurrence on clinical exam at that time. She denies any vaginal bleeding, abdominal/pelvic pain, cough, lethargy or abdominal distention.  No other complaints.    Review of Systems Review of Systems  Constitutional: Negative for malaise/fatigue.  Respiratory: Negative for cough.   Gastrointestinal: Negative for abdominal pain.  Genitourinary:       Negative for vaginal bleeding    Current Meds:  Outpatient Encounter Medications as of 08/21/2021  Medication Sig   calcium-vitamin D (OSCAL WITH D) 500-200 MG-UNIT tablet Take 1 tablet by mouth daily with breakfast.   EPINEPHrine 0.3 mg/0.3 mL IJ SOAJ injection INJ UTD PRF SYSTEMIC REACTIONS   hydrocortisone 2.5 % ointment APPLY ON THE LIPS BID   Multiple Vitamin (MULTIVITAMIN) capsule Take 1 capsule by mouth daily.   NON FORMULARY Allergy Injections- weekly   Probiotic Product (ALIGN) 4 MG CAPS Take 1 tablet by mouth daily.   No facility-administered encounter medications on file as of 08/21/2021.    Allergy:  Allergies  Allergen Reactions   Sulfonamide Derivatives Hives   Sulfur Hives   Thimerosal     Swelling of eyelids from eye drops   Sudafed [Pseudoephedrine] Palpitations    Social Hx:   Social History    Socioeconomic History   Marital status: Married    Spouse name: Not on file   Number of children: 2   Years of education: Not on file   Highest education level: Not on file  Occupational History   Occupation: Engineer, technical sales at [employer]   Smoking status: Never   Smokeless tobacco: Never  Vaping Use   Vaping Use: Never used  Substance and Sexual Activity   Alcohol use: No   Drug use: No   Sexual activity: Never    Partners: Male  Other Topics Concern   Not on file  Social History Narrative   Married for 9 years    Two children ( both live locally)    Retired - Hydrologist - Chiropractor       She likes to travel    Social Determinants of Radio broadcast assistant Strain: Not on Art therapist Insecurity: Not on Pensions consultant Needs: Not on file  Physical Activity: Not on file  Stress: Not on file  Social Connections: Not on file  Intimate Partner Violence: Not on file    Past Surgical Hx:  Past Surgical History:  Procedure Laterality Date   BREAST BIOPSY     COSMETIC SURGERY     DILATION AND CURETTAGE OF UTERUS     EYELID IMPLANT     right and left   RHINOPLASTY     ROBOTIC ASSISTED TOTAL HYSTERECTOMY WITH BILATERAL SALPINGO OOPHERECTOMY Bilateral 02/24/2017   Procedure: XI ROBOTIC ASSISTED TOTAL HYSTERECTOMY WITH BILATERAL SALPINGO OOPHORECTOMY;  Surgeon: [name], MD;  Location: [facility];  Service: Gynecology;  Laterality: Bilateral;   SENTINEL NODE BIOPSY N/A 02/24/2017   Procedure: SENTINEL NODE BIOPSY;  Surgeon: [name], MD;  Location: [facility];  Service: Gynecology;  Laterality: N/A;   TONSILECTOMY/ADENOIDECTOMY WITH MYRINGOTOMY      Past Medical Hx:  Past Medical History:  Diagnosis Date   Abnormal uterine bleeding    ALLERGIC RHINITIS 07/07/2007   COLONIC POLYPS, ADENOMATOUS, HX OF 11/19/2005   CONSTIPATION 12/31/2009   DIVERTICULOSIS, COLON 07/12/2008   Endometrial cancer ([place])    Family history of colon cancer    Hearing  loss    History of radiation therapy 04/07/17-04/30/17   viaginal cuff treated to 30 Gy in 5 fractions   History of radiation therapy 04/07/17,04/14/17,04/21/17,04/30/17   endometrial- Dr. Sondra Come    SEBORRHEIC KERATOSIS, INFLAMED 03/21/2008   SENILE VAGINITIS 01/17/2008    Family Hx:  Family History  Problem Relation Age of Onset   Colon cancer Mother 29   Heart disease Father    Anuerysm Sister    Esophageal cancer Neg Hx    Stomach cancer Neg Hx     Vitals:  BP 111/87 (BP Location: Right Arm, Patient Position: Sitting)   Pulse 89   Temp 97.9 F (36.6 C) (Oral)   Resp 18   Ht 5\' 8"  (1.727 m)   Wt 139 lb 14.4 oz (63.5 kg)   SpO2 100%   BMI 21.27 kg/m     Physical Exam:  Physical Exam Exam conducted with a chaperone present.  Constitutional:      General: She is not in acute distress.    Appearance: Normal appearance.  Chest:     Chest wall: No tenderness.  Abdominal:     General: There is no distension.     Palpations: Abdomen is soft. There is no mass.     Tenderness: There is no abdominal tenderness.  Genitourinary:    General: Normal vulva.     Exam position: Lithotomy position.     Vagina: No lesions.     Adnexa:        Right: No mass or tenderness.         Left: No mass or tenderness.       Rectum: Normal.     Comments: Sclerotic appearing vagina w/petehiae-appearing areas.  No discrete lesions or palpable abnormality Lymphadenopathy:     Cervical:     Right cervical: No superficial cervical adenopathy.    Left cervical: No superficial cervical adenopathy.     Lower Body: No right inguinal adenopathy. No left inguinal adenopathy.  Neurological:     Mental Status: She is alert.     Assessment/Plan:  Endometrial cancer ([place]) 86 year old with Stage Ib grade 1 endometrial adenocarcinoma  Negative symptom review, normal exam.  No evidence of recurrence  >f/u w/RAD-ONC in 4/23 >return prn or in 1 yr      I personally spent 25 minutes face-to-face and  non-face-to-face in the care of this patient, which includes all pre, intra, and post visit time on the date of service.  Lahoma Crocker, MD 08/20/2021, 11:15 AM

## 2021-08-21 ENCOUNTER — Encounter: Payer: Self-pay | Admitting: Obstetrics & Gynecology

## 2021-08-21 ENCOUNTER — Other Ambulatory Visit: Payer: Self-pay

## 2021-08-21 ENCOUNTER — Inpatient Hospital Stay: Payer: Medicare PPO | Attending: Obstetrics & Gynecology | Admitting: Obstetrics & Gynecology

## 2021-08-21 DIAGNOSIS — Z90710 Acquired absence of both cervix and uterus: Secondary | ICD-10-CM | POA: Diagnosis not present

## 2021-08-21 DIAGNOSIS — Z8542 Personal history of malignant neoplasm of other parts of uterus: Secondary | ICD-10-CM | POA: Insufficient documentation

## 2021-08-21 DIAGNOSIS — Z923 Personal history of irradiation: Secondary | ICD-10-CM | POA: Diagnosis not present

## 2021-08-21 DIAGNOSIS — Z90722 Acquired absence of ovaries, bilateral: Secondary | ICD-10-CM | POA: Diagnosis not present

## 2021-08-21 DIAGNOSIS — C541 Malignant neoplasm of endometrium: Secondary | ICD-10-CM

## 2021-08-21 NOTE — Patient Instructions (Signed)
Return prn 

## 2021-08-26 DIAGNOSIS — J3089 Other allergic rhinitis: Secondary | ICD-10-CM | POA: Diagnosis not present

## 2021-08-26 DIAGNOSIS — H26493 Other secondary cataract, bilateral: Secondary | ICD-10-CM | POA: Diagnosis not present

## 2021-08-26 DIAGNOSIS — J301 Allergic rhinitis due to pollen: Secondary | ICD-10-CM | POA: Diagnosis not present

## 2021-08-26 DIAGNOSIS — H524 Presbyopia: Secondary | ICD-10-CM | POA: Diagnosis not present

## 2021-08-27 ENCOUNTER — Other Ambulatory Visit: Payer: Self-pay

## 2021-08-28 ENCOUNTER — Ambulatory Visit: Payer: Medicare PPO | Admitting: Family Medicine

## 2021-08-28 ENCOUNTER — Encounter: Payer: Self-pay | Admitting: Family Medicine

## 2021-08-28 VITALS — BP 112/72 | HR 89 | Temp 98.0°F | Ht 68.0 in | Wt 139.0 lb

## 2021-08-28 DIAGNOSIS — E7800 Pure hypercholesterolemia, unspecified: Secondary | ICD-10-CM

## 2021-08-28 DIAGNOSIS — Z0000 Encounter for general adult medical examination without abnormal findings: Secondary | ICD-10-CM | POA: Diagnosis not present

## 2021-08-28 DIAGNOSIS — E2839 Other primary ovarian failure: Secondary | ICD-10-CM | POA: Diagnosis not present

## 2021-08-28 DIAGNOSIS — Z23 Encounter for immunization: Secondary | ICD-10-CM

## 2021-08-28 DIAGNOSIS — Z87898 Personal history of other specified conditions: Secondary | ICD-10-CM | POA: Diagnosis not present

## 2021-08-28 LAB — COMPREHENSIVE METABOLIC PANEL
ALT: 10 U/L (ref 0–35)
AST: 19 U/L (ref 0–37)
Albumin: 4.2 g/dL (ref 3.5–5.2)
Alkaline Phosphatase: 53 U/L (ref 39–117)
BUN: 20 mg/dL (ref 6–23)
CO2: 30 mEq/L (ref 19–32)
Calcium: 9.7 mg/dL (ref 8.4–10.5)
Chloride: 101 mEq/L (ref 96–112)
Creatinine, Ser: 0.9 mg/dL (ref 0.40–1.20)
GFR: 58.93 mL/min — ABNORMAL LOW (ref >60.00)
Glucose, Bld: 80 mg/dL (ref 70–99)
Potassium: 4.4 mEq/L (ref 3.5–5.1)
Sodium: 137 mEq/L (ref 135–145)
Total Bilirubin: 0.5 mg/dL (ref 0.2–1.2)
Total Protein: 7.1 g/dL (ref 6.0–8.3)

## 2021-08-28 LAB — LIPID PANEL
Cholesterol: 184 mg/dL (ref 0–200)
HDL: 52.2 mg/dL (ref >39.00)
LDL Cholesterol: 110 mg/dL — ABNORMAL HIGH (ref 0–99)
NonHDL: 131.84
Total CHOL/HDL Ratio: 4
Triglycerides: 111 mg/dL (ref 0.0–149.0)
VLDL: 22.2 mg/dL (ref 0.0–40.0)

## 2021-08-28 LAB — HEMOGLOBIN A1C: Hgb A1c MFr Bld: 5.4 % (ref 4.6–6.5)

## 2021-08-28 NOTE — Patient Instructions (Signed)
Give [place] 2-3 business days to get the results of your labs back.   Keep the diet clean and stay active.  I recommend getting the updated bivalent covid vaccination booster at your convenience.   Let us know if you need anything.

## 2021-08-28 NOTE — Progress Notes (Signed)
Chief Complaint  Patient presents with   transfer of care     Well Woman Sheila Armstrong is here for a complete physical.   Her last physical was >1 year ago.  Current diet: in general, a "healthy" diet. Current exercise: walking. Weight is stable and she denies daytime fatigue. Seatbelt? Yes  Health Maintenance Shingrix- Yes DEXA- Due Tetanus- Yes Pneumonia- Yes  Past Medical History:  Diagnosis Date   Abnormal uterine bleeding    ALLERGIC RHINITIS 07/07/2007   COLONIC POLYPS, ADENOMATOUS, HX OF 11/19/2005   CONSTIPATION 12/31/2009   DIVERTICULOSIS, COLON 07/12/2008   Endometrial cancer (Sheila Armstrong)    Family history of colon cancer    Hearing loss    History of radiation therapy 04/07/17-04/30/17   viaginal cuff treated to 30 Gy in 5 fractions   History of radiation therapy 04/07/17,04/14/17,04/21/17,04/30/17   endometrial- Dr. Sondra Come    SEBORRHEIC KERATOSIS, INFLAMED 03/21/2008   SENILE VAGINITIS 01/17/2008     Past Surgical History:  Procedure Laterality Date   BREAST BIOPSY     COSMETIC SURGERY     DILATION AND CURETTAGE OF UTERUS     EYELID IMPLANT     right and left   RHINOPLASTY     ROBOTIC ASSISTED TOTAL HYSTERECTOMY WITH BILATERAL SALPINGO OOPHERECTOMY Bilateral 02/24/2017   Procedure: XI ROBOTIC ASSISTED TOTAL HYSTERECTOMY WITH BILATERAL SALPINGO OOPHORECTOMY;  Surgeon: [name], MD;  Location: [facility];  Service: Gynecology;  Laterality: Bilateral;   SENTINEL NODE BIOPSY N/A 02/24/2017   Procedure: SENTINEL NODE BIOPSY;  Surgeon: [name], MD;  Location: [facility];  Service: Gynecology;  Laterality: N/A;   TONSILECTOMY/ADENOIDECTOMY WITH MYRINGOTOMY      Medications  Current Outpatient Medications on File Prior to Visit  Medication Sig Dispense Refill   calcium-vitamin D (OSCAL WITH D) 500-200 MG-UNIT tablet Take 1 tablet by mouth daily with breakfast.     EPINEPHrine 0.3 mg/0.3 mL IJ SOAJ injection INJ UTD PRF SYSTEMIC REACTIONS  0   hydrocortisone 2.5 % ointment  APPLY ON THE LIPS BID  1   Multiple Vitamin (MULTIVITAMIN) capsule Take 1 capsule by mouth daily.     NON FORMULARY Allergy Injections- weekly     Probiotic Product (ALIGN) 4 MG CAPS Take 1 tablet by mouth daily.      Allergies Allergies  Allergen Reactions   Amoxicillin Diarrhea   Sulfonamide Derivatives Hives   Sulfur Hives   Thimerosal     Swelling of eyelids from eye drops   Sudafed [Pseudoephedrine] Palpitations    Review of Systems: Constitutional:  no fevers Eye:  no recent significant change in vision Ears:  No changes in hearing Nose/Mouth/Throat:  no complaints of nasal congestion, no sore throat Cardiovascular: no chest pain Respiratory:  No shortness of breath Gastrointestinal:  No change in bowel habits GU:  Female: negative for dysuria Integumentary:  no abnormal skin lesions reported Neurologic:  no headaches Endocrine:  denies unexplained weight changes  Exam BP 112/72   Pulse 89   Temp 98 F (36.7 C) (Oral)   Ht 5\' 8"  (1.727 m)   Wt 139 lb (63 kg)   SpO2 94%   BMI 21.13 kg/m  General:  well developed, well nourished, in no apparent distress Skin:  no significant moles, warts, or growths Head:  no masses, lesions, or tenderness Eyes:  pupils equal and round, sclera anicteric without injection Ears:  canals without lesions, TMs shiny without retraction, no obvious effusion, no erythema Nose:  nares patent, septum midline,  mucosa normal, and no drainage or sinus tenderness Throat/Pharynx:  lips and gingiva without lesion; tongue and uvula midline; non-inflamed pharynx; no exudates or postnasal drainage Neck: neck supple without adenopathy, thyromegaly, or masses Lungs:  clear to auscultation, breath sounds equal bilaterally, no respiratory distress Cardio:  regular rate and rhythm, no bruits or LE edema Abdomen:  abdomen soft, nontender; bowel sounds normal; no masses or organomegaly Genital: Deferred Neuro:  gait normal; deep tendon reflexes normal  and symmetric Psych: well oriented with normal range of affect and appropriate judgment/insight  Assessment and Plan  Well adult exam  Need for influenza vaccination - Plan: Flu Vaccine QUAD High Dose(Fluad)  Estrogen deficiency - Plan: DG Bone Density  Pure hypercholesterolemia - Plan: Comprehensive metabolic panel, Lipid panel  History of prediabetes - Plan: Hemoglobin A1c   Well 86 y.o. female. I'm impressed with her health for her age actually. New pt to me.  Counseled on diet and exercise. Other orders as above. DEXA due.  Flu shot today. Bivalent booster rec'd.  Follow up in 1 yr or prn. The patient voiced understanding and agreement to the plan.  [name], DO 08/28/21 8:46 AM

## 2021-08-28 NOTE — Progress Notes (Signed)
[id number]

## 2021-08-29 ENCOUNTER — Other Ambulatory Visit: Payer: Self-pay

## 2021-08-29 ENCOUNTER — Ambulatory Visit (HOSPITAL_BASED_OUTPATIENT_CLINIC_OR_DEPARTMENT_OTHER)
Admission: RE | Admit: 2021-08-29 | Discharge: 2021-08-29 | Disposition: A | Discharge status: Home / Self Care | Payer: Medicare PPO | Source: Ambulatory Visit | Attending: Family Medicine | Admitting: Family Medicine

## 2021-08-29 DIAGNOSIS — M85851 Other specified disorders of bone density and structure, right thigh: Secondary | ICD-10-CM | POA: Diagnosis not present

## 2021-08-29 DIAGNOSIS — E2839 Other primary ovarian failure: Secondary | ICD-10-CM | POA: Insufficient documentation

## 2021-08-29 DIAGNOSIS — Z780 Asymptomatic menopausal state: Secondary | ICD-10-CM | POA: Diagnosis not present

## 2021-08-29 DIAGNOSIS — M810 Age-related osteoporosis without current pathological fracture: Secondary | ICD-10-CM | POA: Diagnosis not present

## 2021-09-02 DIAGNOSIS — J3089 Other allergic rhinitis: Secondary | ICD-10-CM | POA: Diagnosis not present

## 2021-09-02 DIAGNOSIS — J301 Allergic rhinitis due to pollen: Secondary | ICD-10-CM | POA: Diagnosis not present

## 2021-09-03 ENCOUNTER — Encounter: Payer: Self-pay | Admitting: Family Medicine

## 2021-09-03 ENCOUNTER — Other Ambulatory Visit: Payer: Self-pay | Admitting: Family Medicine

## 2021-09-03 ENCOUNTER — Ambulatory Visit: Payer: Medicare PPO | Admitting: Family Medicine

## 2021-09-03 ENCOUNTER — Other Ambulatory Visit: Payer: Self-pay

## 2021-09-03 VITALS — BP 110/72 | HR 98 | Temp 98.1°F | Ht 67.5 in | Wt 138.0 lb

## 2021-09-03 DIAGNOSIS — M810 Age-related osteoporosis without current pathological fracture: Secondary | ICD-10-CM | POA: Diagnosis not present

## 2021-09-03 DIAGNOSIS — J301 Allergic rhinitis due to pollen: Secondary | ICD-10-CM | POA: Diagnosis not present

## 2021-09-03 DIAGNOSIS — J3089 Other allergic rhinitis: Secondary | ICD-10-CM | POA: Diagnosis not present

## 2021-09-03 LAB — VITAMIN D 25 HYDROXY (VIT D DEFICIENCY, FRACTURES): VITD: 30.92 ng/mL (ref 30.00–100.00)

## 2021-09-03 MED ORDER — ALENDRONATE SODIUM 70 MG PO TABS: 70.0000 mg | ORAL_TABLET | ORAL | 11 refills | Status: DC

## 2021-09-03 NOTE — Patient Instructions (Addendum)
Give [place] 2-3 business days to get the results of your labs back.   Take 1200 mg of calcium daily and at least 1000 units of vitamin D3 daily.   If labs are normal, we will send in a weekly medicine to prevent fractures.   Aim to do some physical exertion for 150 minutes per week. This is typically divided into 5 days per week, 30 minutes per day. The activity should be enough to get your heart rate up. Anything is better than nothing if you have time constraints.  Weight bearing/resistance exercise is very important.   Let us know if you need anything.

## 2021-09-03 NOTE — Progress Notes (Signed)
Chief Complaint  Patient presents with   Results    Bone Density    Subjective: Patient is a 86 y.o. female here for f/u DEXA.  DEXA showed osteoporosis. She is not taking any medication nor has she. She walks but no other wt resistance exercise. She does take Vit D and Ca, does not remember how much.   Past Medical History:  Diagnosis Date   Abnormal uterine bleeding    ALLERGIC RHINITIS 07/07/2007   COLONIC POLYPS, ADENOMATOUS, HX OF 11/19/2005   CONSTIPATION 12/31/2009   DIVERTICULOSIS, COLON 07/12/2008   Endometrial cancer ([place])    Family history of colon cancer    Hearing loss    History of radiation therapy 04/07/17-04/30/17   viaginal cuff treated to 30 Gy in 5 fractions   History of radiation therapy 04/07/17,04/14/17,04/21/17,04/30/17   endometrial- Dr. Sondra Come    SEBORRHEIC KERATOSIS, INFLAMED 03/21/2008   SENILE VAGINITIS 01/17/2008    Objective: BP 110/72   Pulse 98   Temp 98.1 F (36.7 C) (Oral)   Ht 5' 7.5" (1.715 m)   Wt 138 lb (62.6 kg)   SpO2 99%   BMI 21.29 kg/m  General: Awake, appears stated age Heart: RRR, no LE edema Lungs: CTAB, no rales, wheezes or rhonchi. No accessory muscle use Psych: Age appropriate judgment and insight, normal affect and mood  Assessment and Plan: Age-related osteoporosis without current pathological fracture - Plan: VITAMIN D 25 Hydroxy (Vit-D Deficiency, Fractures)  New chronic issue, not ideally managed yet. Ck Vit D today. If nml, will send in Fosamax 70 mg weekly.  F/u in 6 mo for med ck.  The patient voiced understanding and agreement to the plan.  [place], DO 09/03/21  12:54 PM

## 2021-09-09 ENCOUNTER — Telehealth: Payer: Self-pay | Admitting: Family Medicine

## 2021-09-09 DIAGNOSIS — J301 Allergic rhinitis due to pollen: Secondary | ICD-10-CM | POA: Diagnosis not present

## 2021-09-09 DIAGNOSIS — J3089 Other allergic rhinitis: Secondary | ICD-10-CM | POA: Diagnosis not present

## 2021-09-09 NOTE — Telephone Encounter (Signed)
Could you look into Prolia for this patient.

## 2021-09-11 NOTE — Telephone Encounter (Signed)
Pt Info submitted for Prolia, awaiting Summary of Benefits. Will call and schedule for appointment once received and reviewed with pt.

## 2021-09-17 DIAGNOSIS — J3089 Other allergic rhinitis: Secondary | ICD-10-CM | POA: Diagnosis not present

## 2021-09-23 DIAGNOSIS — J3089 Other allergic rhinitis: Secondary | ICD-10-CM | POA: Diagnosis not present

## 2021-09-23 DIAGNOSIS — J301 Allergic rhinitis due to pollen: Secondary | ICD-10-CM | POA: Diagnosis not present

## 2021-09-30 DIAGNOSIS — J3089 Other allergic rhinitis: Secondary | ICD-10-CM | POA: Diagnosis not present

## 2021-09-30 DIAGNOSIS — J301 Allergic rhinitis due to pollen: Secondary | ICD-10-CM | POA: Diagnosis not present

## 2021-10-07 IMAGING — MG DIGITAL SCREENING BILAT W/ CAD
5 series · 5 of 5 positions shown · non-contrast
Comparison: Previous exam(s).

CLINICAL DATA: Screening.

EXAM:
DIGITAL SCREENING BILATERAL MAMMOGRAM WITH CAD
TECHNIQUE: Bilateral screening digital craniocaudal and mediolateral oblique
mammograms were obtained. The images were evaluated with
computer-aided detection.

[L MLO]
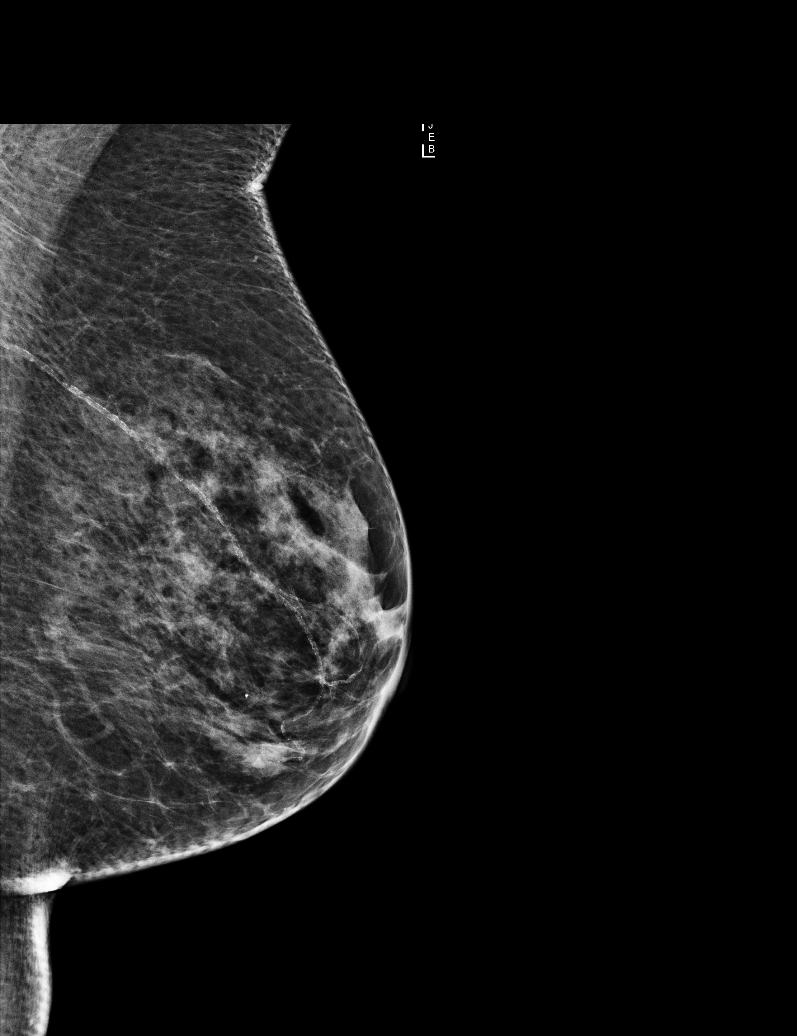

[L CC]
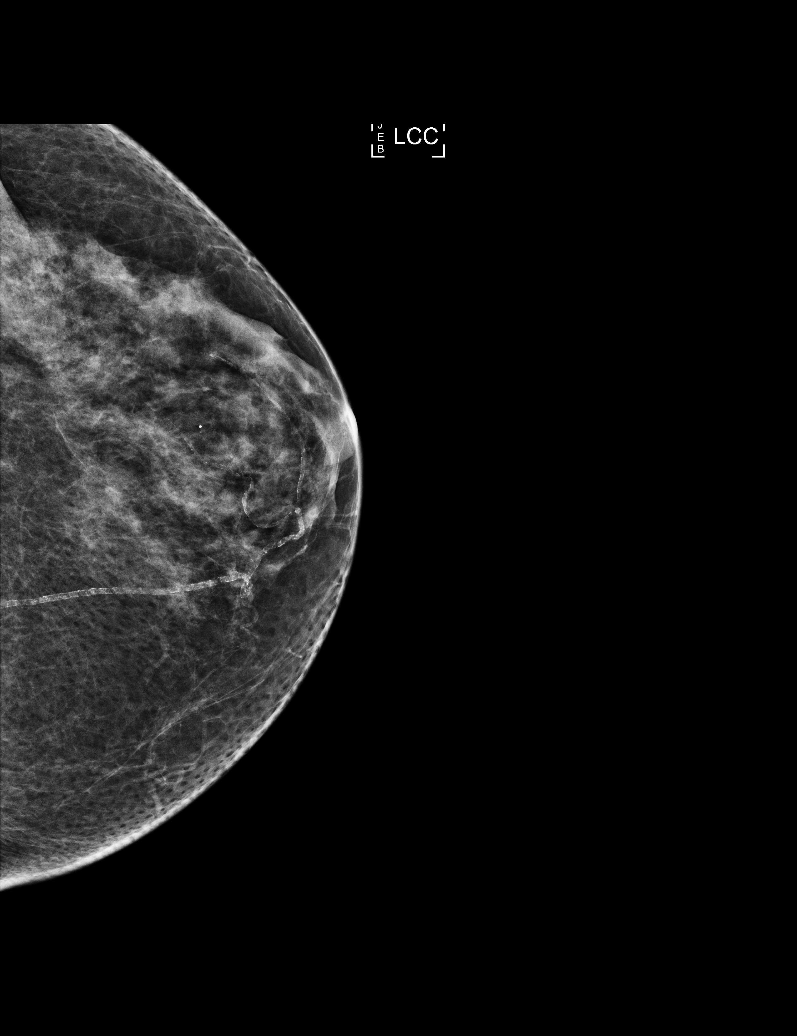

[R CC (1 of 2)]
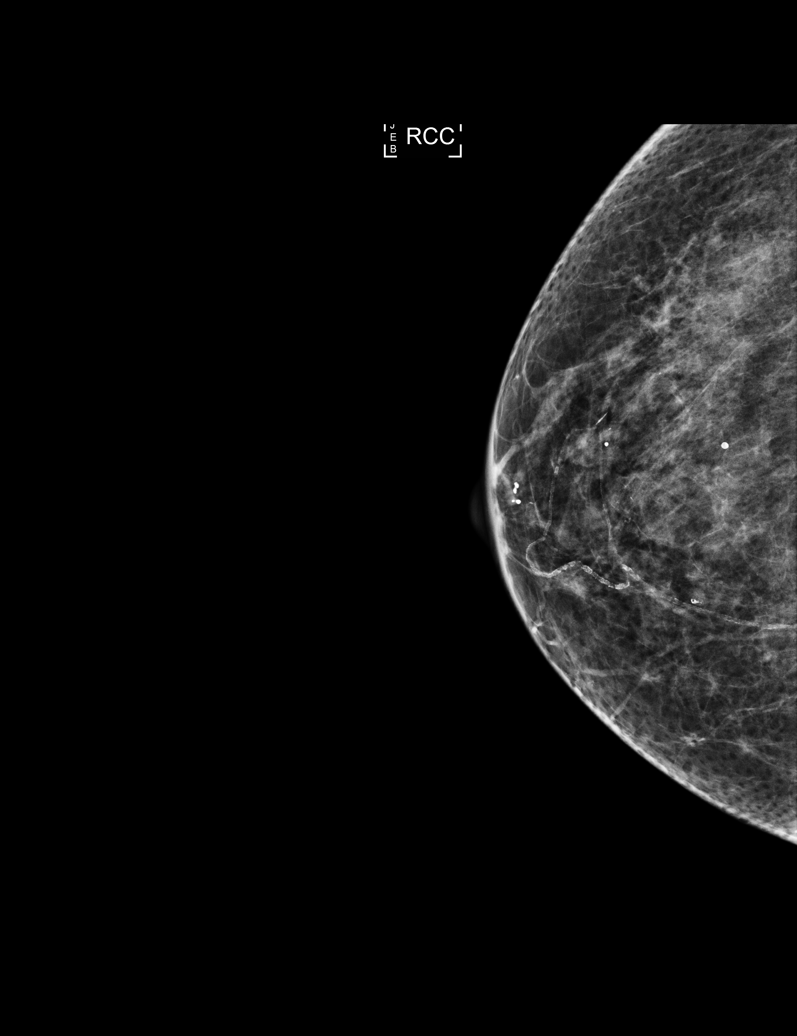

[R MLO]
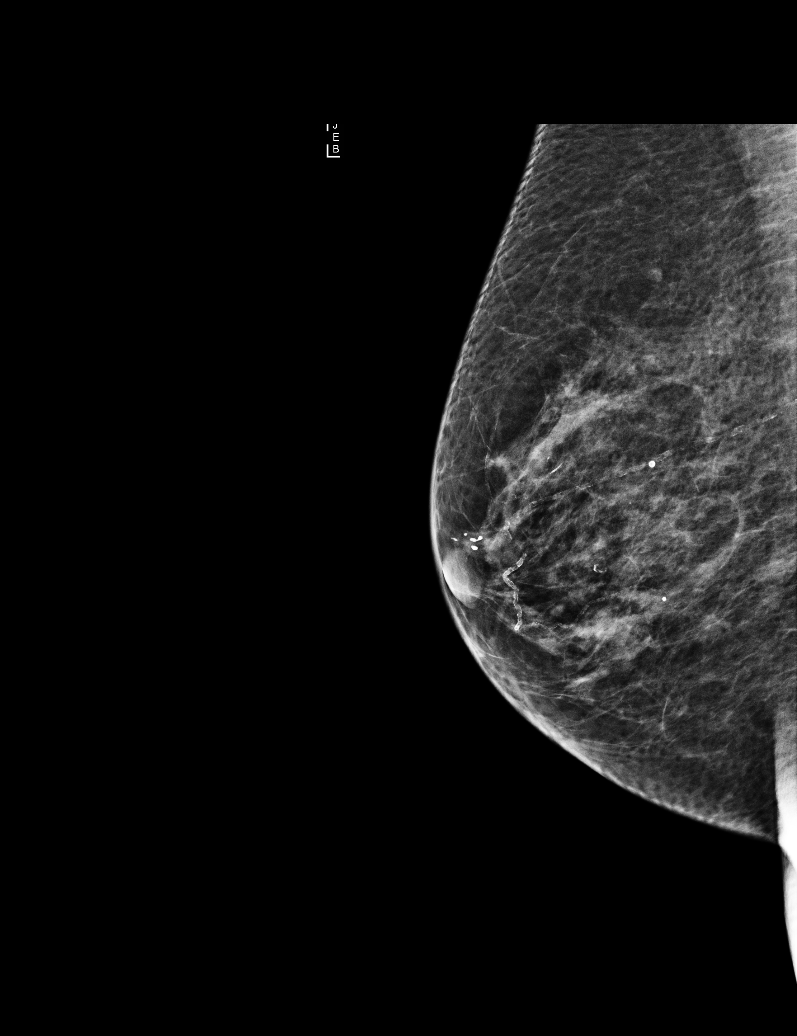

[R CC (2 of 2)]
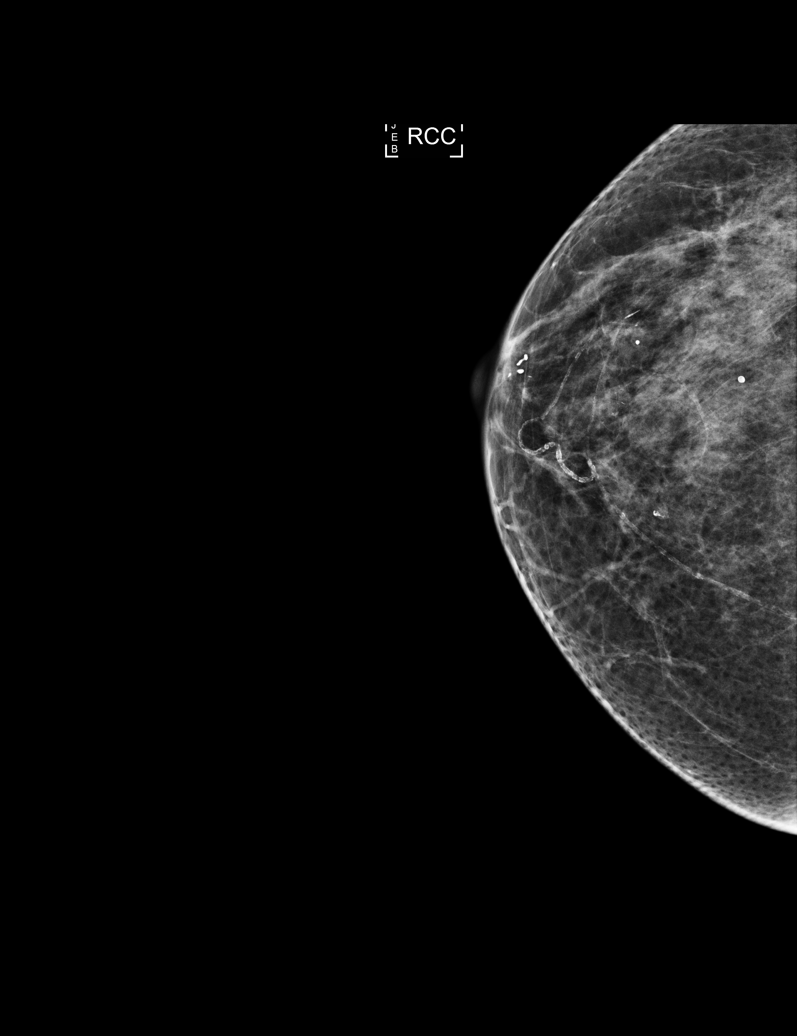

[5 of 5 positions shown; findings below may reference images not displayed]

ACR Breast Density Category c: The breast tissue is heterogeneously
dense, which may obscure small masses.
FINDINGS: There are no findings suspicious for malignancy.
IMPRESSION: No mammographic evidence of malignancy. A result letter of this
screening mammogram will be mailed directly to the patient.

RECOMMENDATION:
Screening mammogram in one year. (Code:TJ-B-ZPA)

BI-RADS CATEGORY  1: Negative.

## 2021-10-08 ENCOUNTER — Other Ambulatory Visit (HOSPITAL_BASED_OUTPATIENT_CLINIC_OR_DEPARTMENT_OTHER): Payer: Self-pay

## 2021-10-08 ENCOUNTER — Ambulatory Visit: Payer: Medicare PPO | Attending: Internal Medicine

## 2021-10-08 DIAGNOSIS — Z23 Encounter for immunization: Secondary | ICD-10-CM

## 2021-10-08 MED ORDER — PFIZER COVID-19 VAC BIVALENT 30 MCG/0.3ML IM SUSP
INTRAMUSCULAR | 0 refills | Status: DC
  Filled 2021-10-08: qty 0.3, 1d supply, fill #0

## 2021-10-08 NOTE — Progress Notes (Signed)
   Covid-19 Vaccination Clinic  Name:  Sheila Armstrong    MRN: [id number] DOB: [birth year]  10/08/2021  [name] was observed post Covid-19 immunization for 15 minutes without incident. She was provided with Vaccine Information Sheet and instruction to access the V-Safe system.   [name] was instructed to call 911 with any severe reactions post vaccine: Difficulty breathing  Swelling of face and throat  A fast heartbeat  A bad rash all over body  Dizziness and weakness   Immunizations Administered     Name Date Dose VIS Date Route   Pfizer Covid-19 Vaccine Bivalent Booster 10/08/2021  9:47 AM 0.3 mL 07/10/2021 Intramuscular   Manufacturer: [employer]   Lot: [id number]   [place]: [phone]

## 2021-10-15 DIAGNOSIS — J3089 Other allergic rhinitis: Secondary | ICD-10-CM | POA: Diagnosis not present

## 2021-10-18 DIAGNOSIS — J301 Allergic rhinitis due to pollen: Secondary | ICD-10-CM | POA: Diagnosis not present

## 2021-10-18 DIAGNOSIS — J3089 Other allergic rhinitis: Secondary | ICD-10-CM | POA: Diagnosis not present

## 2021-10-21 DIAGNOSIS — J3089 Other allergic rhinitis: Secondary | ICD-10-CM | POA: Diagnosis not present

## 2021-10-21 DIAGNOSIS — J301 Allergic rhinitis due to pollen: Secondary | ICD-10-CM | POA: Diagnosis not present

## 2021-10-28 DIAGNOSIS — J301 Allergic rhinitis due to pollen: Secondary | ICD-10-CM | POA: Diagnosis not present

## 2021-10-28 DIAGNOSIS — J3089 Other allergic rhinitis: Secondary | ICD-10-CM | POA: Diagnosis not present

## 2021-11-06 DIAGNOSIS — J301 Allergic rhinitis due to pollen: Secondary | ICD-10-CM | POA: Diagnosis not present

## 2021-11-06 DIAGNOSIS — J3089 Other allergic rhinitis: Secondary | ICD-10-CM | POA: Diagnosis not present

## 2021-11-12 DIAGNOSIS — J3089 Other allergic rhinitis: Secondary | ICD-10-CM | POA: Diagnosis not present

## 2021-11-18 DIAGNOSIS — J301 Allergic rhinitis due to pollen: Secondary | ICD-10-CM | POA: Diagnosis not present

## 2021-11-18 DIAGNOSIS — J3089 Other allergic rhinitis: Secondary | ICD-10-CM | POA: Diagnosis not present

## 2021-11-25 DIAGNOSIS — J3089 Other allergic rhinitis: Secondary | ICD-10-CM | POA: Diagnosis not present

## 2021-11-25 DIAGNOSIS — J301 Allergic rhinitis due to pollen: Secondary | ICD-10-CM | POA: Diagnosis not present

## 2021-12-02 DIAGNOSIS — J301 Allergic rhinitis due to pollen: Secondary | ICD-10-CM | POA: Diagnosis not present

## 2021-12-02 DIAGNOSIS — J3089 Other allergic rhinitis: Secondary | ICD-10-CM | POA: Diagnosis not present

## 2021-12-09 ENCOUNTER — Telehealth (INDEPENDENT_AMBULATORY_CARE_PROVIDER_SITE_OTHER): Payer: Medicare PPO | Admitting: Internal Medicine

## 2021-12-09 ENCOUNTER — Encounter: Payer: Self-pay | Admitting: Internal Medicine

## 2021-12-09 VITALS — Ht 67.5 in

## 2021-12-09 DIAGNOSIS — U071 COVID-19: Secondary | ICD-10-CM | POA: Diagnosis not present

## 2021-12-09 MED ORDER — MOLNUPIRAVIR EUA 200MG CAPSULE: 4.0000 | ORAL_CAPSULE | Freq: Two times a day (BID) | ORAL | 0 refills | Status: AC

## 2021-12-09 NOTE — Progress Notes (Signed)
Subjective:    Patient ID: Sheila Armstrong, female    DOB: 07/20/1935, 87 y.o.   MRN: [id number]  DOS:  12/09/2021 Type of visit - description: Virtual Visit via Video Note  I connected with the above patient  by a video enabled telemedicine application and verified that I am speaking with the correct person using two identifiers.   THIS ENCOUNTER IS A VIRTUAL VISIT DUE TO COVID-19 - PATIENT WAS NOT SEEN IN THE OFFICE. PATIENT HAS CONSENTED TO VIRTUAL VISIT / TELEMEDICINE VISIT   Location of patient: home  Location of provider: office  Persons participating in the virtual visit: patient, provider   I discussed the limitations of evaluation and management by telemedicine and the availability of in person appointments. The patient expressed understanding and agreed to proceed.  Acute Symptoms a started 4 days ago, sore throat, postnasal dripping, runny nose, mild cough (dry). Her husband tested positive for COVID last week. 2 days ago the patient tested negative but this morning her COVID test was positive. Is reaching out for further advice.       Review of Systems See above   Past Medical History:  Diagnosis Date   Abnormal uterine bleeding    ALLERGIC RHINITIS 07/07/2007   COLONIC POLYPS, ADENOMATOUS, HX OF 11/19/2005   CONSTIPATION 12/31/2009   DIVERTICULOSIS, COLON 07/12/2008   Endometrial cancer ([place])    Family history of colon cancer    Hearing loss    History of radiation therapy 04/07/17-04/30/17   viaginal cuff treated to 30 Gy in 5 fractions   History of radiation therapy 04/07/17,04/14/17,04/21/17,04/30/17   endometrial- Dr. Sondra Come    SEBORRHEIC KERATOSIS, INFLAMED 03/21/2008   SENILE VAGINITIS 01/17/2008    Past Surgical History:  Procedure Laterality Date   BREAST BIOPSY     COSMETIC SURGERY     DILATION AND CURETTAGE OF UTERUS     EYELID IMPLANT     right and left   RHINOPLASTY     ROBOTIC ASSISTED TOTAL HYSTERECTOMY WITH BILATERAL SALPINGO OOPHERECTOMY  Bilateral 02/24/2017   Procedure: XI ROBOTIC ASSISTED TOTAL HYSTERECTOMY WITH BILATERAL SALPINGO OOPHORECTOMY;  Surgeon: [name], MD;  Location: [facility];  Service: Gynecology;  Laterality: Bilateral;   SENTINEL NODE BIOPSY N/A 02/24/2017   Procedure: SENTINEL NODE BIOPSY;  Surgeon: [name], MD;  Location: [facility];  Service: Gynecology;  Laterality: N/A;   TONSILECTOMY/ADENOIDECTOMY WITH MYRINGOTOMY      Current Outpatient Medications  Medication Instructions   alendronate (FOSAMAX) 70 mg, Oral, Every 7 days, Take with a full glass of water on an empty stomach.   calcium-vitamin D (OSCAL WITH D) 500-200 MG-UNIT tablet 1 tablet, Oral, Daily with breakfast   EPINEPHrine 0.3 mg/0.3 mL IJ SOAJ injection INJ UTD PRF SYSTEMIC REACTIONS   molnupiravir EUA (LAGEVRIO) 800 mg, Oral, 2 times daily   Multiple Vitamin (MULTIVITAMIN) capsule 1 capsule, Oral, Daily   NON FORMULARY Allergy Injections- weekly    Probiotic Product (ALIGN) 4 MG CAPS 1 tablet, Oral, Daily       Objective:   Physical Exam Ht 5' 7.5" (1.715 m)    BMI 21.29 kg/m  This is a virtual video visit, she is sitting, without distress, + nasal congestion and mild cough noted.  Speaking in complete sentences with no apparent distress.  Normal vital signs available     Assessment      COVID-19 The patient is 87 years old, other than allergies she has no major medical problems.  Currently only takes  allergy injections, vitamins. Has been vaccinated for COVID fully. Develop symptoms approximately 4 days ago, not toxic appearing. Plan: Rest, fluids, Mucinex DM, Nasacort. Send molnupiravir as she is at high risk due to age. Isolate x7 days  If she is not gradually better she will let us know.  I discussed the assessment and treatment plan with the patient. The patient was provided an opportunity to ask questions and all were answered. The patient agreed with the plan and demonstrated an understanding of the instructions.    The patient was advised to call back or seek an in-person evaluation if the symptoms worsen or if the condition fails to improve as anticipated.

## 2021-12-16 DIAGNOSIS — H532 Diplopia: Secondary | ICD-10-CM | POA: Diagnosis not present

## 2021-12-23 DIAGNOSIS — J301 Allergic rhinitis due to pollen: Secondary | ICD-10-CM | POA: Diagnosis not present

## 2021-12-23 DIAGNOSIS — J3089 Other allergic rhinitis: Secondary | ICD-10-CM | POA: Diagnosis not present

## 2021-12-30 DIAGNOSIS — J3089 Other allergic rhinitis: Secondary | ICD-10-CM | POA: Diagnosis not present

## 2021-12-30 DIAGNOSIS — J301 Allergic rhinitis due to pollen: Secondary | ICD-10-CM | POA: Diagnosis not present

## 2022-01-06 DIAGNOSIS — H5310 Unspecified subjective visual disturbances: Secondary | ICD-10-CM | POA: Diagnosis not present

## 2022-01-06 DIAGNOSIS — J301 Allergic rhinitis due to pollen: Secondary | ICD-10-CM | POA: Diagnosis not present

## 2022-01-06 DIAGNOSIS — J3089 Other allergic rhinitis: Secondary | ICD-10-CM | POA: Diagnosis not present

## 2022-01-06 DIAGNOSIS — H532 Diplopia: Secondary | ICD-10-CM | POA: Diagnosis not present

## 2022-01-07 DIAGNOSIS — J301 Allergic rhinitis due to pollen: Secondary | ICD-10-CM | POA: Diagnosis not present

## 2022-01-07 DIAGNOSIS — J3089 Other allergic rhinitis: Secondary | ICD-10-CM | POA: Diagnosis not present

## 2022-01-11 ENCOUNTER — Encounter: Payer: Self-pay | Admitting: Family Medicine

## 2022-01-15 DIAGNOSIS — J301 Allergic rhinitis due to pollen: Secondary | ICD-10-CM | POA: Diagnosis not present

## 2022-01-15 DIAGNOSIS — J3089 Other allergic rhinitis: Secondary | ICD-10-CM | POA: Diagnosis not present

## 2022-01-20 DIAGNOSIS — J301 Allergic rhinitis due to pollen: Secondary | ICD-10-CM | POA: Diagnosis not present

## 2022-01-20 DIAGNOSIS — J3089 Other allergic rhinitis: Secondary | ICD-10-CM | POA: Diagnosis not present

## 2022-01-28 DIAGNOSIS — J301 Allergic rhinitis due to pollen: Secondary | ICD-10-CM | POA: Diagnosis not present

## 2022-01-28 DIAGNOSIS — J3089 Other allergic rhinitis: Secondary | ICD-10-CM | POA: Diagnosis not present

## 2022-01-28 DIAGNOSIS — J3081 Allergic rhinitis due to animal (cat) (dog) hair and dander: Secondary | ICD-10-CM | POA: Diagnosis not present

## 2022-02-04 DIAGNOSIS — J3089 Other allergic rhinitis: Secondary | ICD-10-CM | POA: Diagnosis not present

## 2022-02-04 DIAGNOSIS — J301 Allergic rhinitis due to pollen: Secondary | ICD-10-CM | POA: Diagnosis not present

## 2022-02-04 DIAGNOSIS — J3081 Allergic rhinitis due to animal (cat) (dog) hair and dander: Secondary | ICD-10-CM | POA: Diagnosis not present

## 2022-02-05 ENCOUNTER — Encounter: Payer: Self-pay | Admitting: Family Medicine

## 2022-02-07 DIAGNOSIS — J3089 Other allergic rhinitis: Secondary | ICD-10-CM | POA: Diagnosis not present

## 2022-02-07 DIAGNOSIS — J301 Allergic rhinitis due to pollen: Secondary | ICD-10-CM | POA: Diagnosis not present

## 2022-02-10 DIAGNOSIS — J301 Allergic rhinitis due to pollen: Secondary | ICD-10-CM | POA: Diagnosis not present

## 2022-02-10 DIAGNOSIS — J3081 Allergic rhinitis due to animal (cat) (dog) hair and dander: Secondary | ICD-10-CM | POA: Diagnosis not present

## 2022-02-10 DIAGNOSIS — J3089 Other allergic rhinitis: Secondary | ICD-10-CM | POA: Diagnosis not present

## 2022-02-17 DIAGNOSIS — J301 Allergic rhinitis due to pollen: Secondary | ICD-10-CM | POA: Diagnosis not present

## 2022-02-17 DIAGNOSIS — J3089 Other allergic rhinitis: Secondary | ICD-10-CM | POA: Diagnosis not present

## 2022-02-17 DIAGNOSIS — J3081 Allergic rhinitis due to animal (cat) (dog) hair and dander: Secondary | ICD-10-CM | POA: Diagnosis not present

## 2022-02-19 NOTE — Progress Notes (Signed)
?Radiation Oncology         [phone] ?________________________________ ? ?Name: Sheila Armstrong MRN: [id number]  ?Date: 02/20/2022  DOB: 11-04-1935 ? ?Follow-Up Visit Note ? ?CC: Wendling, Crosby Oyster, DO  [name], MD ? ?  ICD-10-CM   ?1. Endometrial cancer ([place])  C54.1   ?  ? ? ?Diagnosis:  pT1b, pN0, FIGO Stage IB Endometriod Adenocarcinoma  ? ?Interval Since Last Radiation: 4 years, 9 months, and 23 days  ? ?Radiation treatment dates:   04/07/17, 04/14/17, 04/21/17, 04/28/17, 04/30/17 ?  ?Site/dose:   Vaginal Cuff treated to 30 Gy in 5 fractions ? ?Narrative:  The patient returns today for routine annual follow-up, she was last seen here for follow up on 02/14/21. Since her last visit, the patient followed up with Dr. Delsa Sale ([place]) on 08/21/21. During which time, she denied any symptoms concerning for disease recurrence and was noted as NED on examination.       ? ?Pertinent imaging performed in the interval since she was last seen includes:  ?-- Bilateral screening mammogram on 05/31/21 which showed no evidence of malignancy in either breast. ?-- DXA for bone density on 08/29/21 showed radial/forearm T-score of -2.7, classifying the patient as osteoporotic according to WHO criteria.    ? ?She denies any pelvic pain abdominal bloating vaginal bleeding or discharge.  She denies any hematuria or rectal bleeding.  No new medical issues since her last follow-up.                  ? ?Allergies:  is allergic to amoxicillin, sulfonamide derivatives, sulfur, thimerosal, and sudafed [pseudoephedrine]. ? ?Meds: ?Current Outpatient Medications  ?Medication Sig Dispense Refill  ? calcium-vitamin D (OSCAL WITH D) 500-200 MG-UNIT tablet Take 1 tablet by mouth daily with breakfast.    ? Cyanocobalamin (VITAMIN B12 PO)     ? fluticasone (FLONASE) 50 MCG/ACT nasal spray 1-2 sprays    ? Multiple Vitamin (MULTIVITAMIN) capsule Take 1 capsule by mouth daily.    ? NON FORMULARY Allergy Injections- weekly    ?  Probiotic Product (ALIGN) 4 MG CAPS Take 1 tablet by mouth daily.    ? VITAMIN D PO     ? alendronate (FOSAMAX) 70 MG tablet Take 1 tablet (70 mg total) by mouth every 7 (seven) days. Take with a full glass of water on an empty stomach. (Patient not taking: Reported on 12/09/2021) 4 tablet 11  ? EPINEPHrine 0.3 mg/0.3 mL IJ SOAJ injection INJ UTD PRF SYSTEMIC REACTIONS (Patient not taking: Reported on 12/09/2021)  0  ? ?No current facility-administered medications for this encounter.  ? ? ?Physical Findings: ?The patient is in no acute distress. Patient is alert and oriented. ? height is '5\' 8"'$  (1.727 m) and weight is 140 lb 12.8 oz (63.9 kg). Her temperature is 97.8 ?F (36.6 ?C). Her blood pressure is 117/65 and her pulse is 79. Her respiration is 20 and oxygen saturation is 98%. .  No significant changes. Lungs are clear to auscultation bilaterally. Heart has regular rate and rhythm. No palpable cervical, supraclavicular, or axillary adenopathy. Abdomen soft, non-tender, normal bowel sounds. ? ?On pelvic examination the external genitalia were unremarkable. A speculum exam was performed. There are no mucosal lesions noted in the vaginal vault.   On bimanual and rectovaginal examination there were no pelvic masses appreciated.  Vaginal cuff intact.  Rectal sphincter tone normal. ? ? ?Lab Findings: ?Lab Results  ?Component Value Date  ? WBC 6.3 08/23/2020  ?  HGB 12.0 08/23/2020  ? HCT 36.1 08/23/2020  ? MCV 94.3 08/23/2020  ? PLT 239 08/23/2020  ? ? ?Radiographic Findings: ?No results found. ? ?Impression:  pT1b, pN0, FIGO Stage IB Endometriod Adenocarcinoma  ? ?No evidence of recurrence on clinical exam today.  She does not appear to be exhibiting any long-term effects from her surgery or vaginal brachytherapy. ? ?Discussed that she could stop using her vaginal dilator at this time.  Congratulated her on successful 5-year follow-up. ? ?Plan: As needed follow-up in radiation oncology.  She will reestablish contact with  Dr. Talbert Nan who was her gynecologist prior to her surgery and radiation therapy. ? ? ?20 minutes of total time was spent for this patient encounter, including preparation, face-to-face counseling with the patient and coordination of care, physical exam, and documentation of the encounter. ?____________________________________ ? ?Blair Promise, PhD, MD ? ?This document serves as a record of services personally performed by Gery Pray, MD. It was created on his behalf by Roney Mans, a trained medical scribe. The creation of this record is based on the scribe's personal observations and the provider's statements to them. This document has been checked and approved by the attending provider. ? ?

## 2022-02-20 ENCOUNTER — Ambulatory Visit
Admission: RE | Admit: 2022-02-20 | Discharge: 2022-02-20 | Disposition: A | Discharge status: Home / Self Care | Payer: Medicare PPO | Source: Ambulatory Visit | Attending: Radiation Oncology | Admitting: Radiation Oncology

## 2022-02-20 ENCOUNTER — Encounter: Payer: Self-pay | Admitting: Radiation Oncology

## 2022-02-20 ENCOUNTER — Other Ambulatory Visit: Payer: Self-pay

## 2022-02-20 VITALS — BP 117/65 | HR 79 | Temp 97.8°F | Resp 20 | Ht 68.0 in | Wt 140.8 lb

## 2022-02-20 DIAGNOSIS — Z8542 Personal history of malignant neoplasm of other parts of uterus: Secondary | ICD-10-CM | POA: Diagnosis not present

## 2022-02-20 DIAGNOSIS — C541 Malignant neoplasm of endometrium: Secondary | ICD-10-CM | POA: Diagnosis not present

## 2022-02-20 DIAGNOSIS — Z923 Personal history of irradiation: Secondary | ICD-10-CM | POA: Diagnosis not present

## 2022-02-20 DIAGNOSIS — Z79899 Other long term (current) drug therapy: Secondary | ICD-10-CM | POA: Diagnosis not present

## 2022-02-20 NOTE — Progress Notes (Signed)
Sheila Armstrong is here today for follow up post radiation to the pelvic. ? ?They completed their radiation on: 04/30/2017  ? ?Does the patient complain of any of the following: ? ?Pain:denies ?Abdominal bloating: denies ?Diarrhea/Constipation: denies ?Nausea/Vomiting: denies ?Vaginal Discharge: denies ?Blood in Urine or Stool: denies ?Urinary Issues (dysuria/incomplete emptying/ incontinence/ increased frequency/urgency): nocturia x1 ?Does patient report using vaginal dilator 2-3 times a week and/or sexually active 2-3 weeks: yes,3 times a week for 15 minutes ?Post radiation skin changes: denies ? ? ?Additional comments if applicable: nothing of note ? ?Vitals:  ? 02/20/22 1105  ?BP: 117/65  ?Pulse: 79  ?Resp: 20  ?Temp: 97.8 ?F (36.6 ?C)  ?SpO2: 98%  ?Weight: 140 lb 12.8 oz (63.9 kg)  ?Height: '5\' 8"'$  (1.727 m)  ? ? ?

## 2022-02-21 ENCOUNTER — Other Ambulatory Visit: Payer: Self-pay | Admitting: Radiology

## 2022-02-21 DIAGNOSIS — C541 Malignant neoplasm of endometrium: Secondary | ICD-10-CM

## 2022-02-24 DIAGNOSIS — J301 Allergic rhinitis due to pollen: Secondary | ICD-10-CM | POA: Diagnosis not present

## 2022-02-24 DIAGNOSIS — J3081 Allergic rhinitis due to animal (cat) (dog) hair and dander: Secondary | ICD-10-CM | POA: Diagnosis not present

## 2022-02-24 DIAGNOSIS — J3089 Other allergic rhinitis: Secondary | ICD-10-CM | POA: Diagnosis not present

## 2022-02-27 DIAGNOSIS — H532 Diplopia: Secondary | ICD-10-CM | POA: Diagnosis not present

## 2022-02-27 DIAGNOSIS — H5022 Vertical strabismus, left eye: Secondary | ICD-10-CM | POA: Diagnosis not present

## 2022-03-03 DIAGNOSIS — J3089 Other allergic rhinitis: Secondary | ICD-10-CM | POA: Diagnosis not present

## 2022-03-03 DIAGNOSIS — J3081 Allergic rhinitis due to animal (cat) (dog) hair and dander: Secondary | ICD-10-CM | POA: Diagnosis not present

## 2022-03-03 DIAGNOSIS — J301 Allergic rhinitis due to pollen: Secondary | ICD-10-CM | POA: Diagnosis not present

## 2022-03-04 ENCOUNTER — Encounter: Payer: Self-pay | Admitting: Family Medicine

## 2022-03-04 ENCOUNTER — Ambulatory Visit: Payer: Medicare PPO | Admitting: Family Medicine

## 2022-03-04 VITALS — BP 120/80 | HR 75 | Temp 97.7°F | Ht 68.0 in | Wt 139.2 lb

## 2022-03-04 DIAGNOSIS — M810 Age-related osteoporosis without current pathological fracture: Secondary | ICD-10-CM | POA: Diagnosis not present

## 2022-03-04 LAB — VITAMIN D 25 HYDROXY (VIT D DEFICIENCY, FRACTURES): VITD: 43.38 ng/mL (ref 30.00–100.00)

## 2022-03-04 NOTE — Progress Notes (Signed)
Chief Complaint  ?Patient presents with  ? Follow-up  ?  6 month ?  ? ? ?Subjective: ?Patient is a 87 y.o. female here for osteoporosis f/u. ? ?DEXA done at end of 2022, showed osteoporosis. She was started on Fosamax 70 mg weekly. She did not taking due to dental concerns. She is taking Vit D and Ca daily. Exercising some. No recent fx since last visit around 5 mo ago.  ? ?Past Medical History:  ?Diagnosis Date  ? Abnormal uterine bleeding   ? ALLERGIC RHINITIS 07/07/2007  ? COLONIC POLYPS, ADENOMATOUS, HX OF 11/19/2005  ? CONSTIPATION 12/31/2009  ? DIVERTICULOSIS, COLON 07/12/2008  ? Endometrial cancer ([place])   ? Family history of colon cancer   ? Hearing loss   ? History of radiation therapy 04/07/17-04/30/17  ? viaginal cuff treated to 30 Gy in 5 fractions  ? History of radiation therapy 04/07/17,04/14/17,04/21/17,04/30/17  ? endometrial- Dr. Sondra Come   ? SEBORRHEIC KERATOSIS, INFLAMED 03/21/2008  ? SENILE VAGINITIS 01/17/2008  ? ? ?Objective: ?BP 120/80   Pulse 75   Temp 97.7 ?F (36.5 ?C) (Oral)   Ht '5\' 8"'$  (1.727 m)   Wt 139 lb 4 oz (63.2 kg)   SpO2 99%   BMI 21.17 kg/m?  ?General: Awake, appears stated age ?Heart: RRR, no LE edema ?Lungs: CTAB, no rales, wheezes or rhonchi. No accessory muscle use ?Psych: Age appropriate judgment and insight, normal affect and mood ? ?Assessment and Plan: ?Age-related osteoporosis without current pathological fracture ? ?Decided against further osteoporosis tx given worry of jaw necrosis. Will cont to stay active and cont w Vit D and Ca supp. She understands that she has a higher fracture risk by choosing this. I will see her in 6 mo for a CPE or prn. Ck Vit D today.  ?The patient voiced understanding and agreement to the plan. ? ?Shelda Pal, DO ?03/04/22  ?9:28 AM ? ? ? ? ?

## 2022-03-04 NOTE — Patient Instructions (Signed)
Give [place] 2-3 business days to get the results of your labs back.  ? ?Stay on the calcium and Vit D.  ? ?Stay active.  ? ?Let us know if you need anything. ?

## 2022-03-10 DIAGNOSIS — J301 Allergic rhinitis due to pollen: Secondary | ICD-10-CM | POA: Diagnosis not present

## 2022-03-10 DIAGNOSIS — J3089 Other allergic rhinitis: Secondary | ICD-10-CM | POA: Diagnosis not present

## 2022-03-14 ENCOUNTER — Ambulatory Visit (INDEPENDENT_AMBULATORY_CARE_PROVIDER_SITE_OTHER): Payer: Medicare PPO

## 2022-03-14 VITALS — Ht 68.0 in | Wt 139.0 lb

## 2022-03-14 DIAGNOSIS — Z0000 Encounter for general adult medical examination without abnormal findings: Secondary | ICD-10-CM | POA: Diagnosis not present

## 2022-03-14 NOTE — Progress Notes (Signed)
? ?Subjective:  ? Sheila Armstrong is a 87 y.o. female who presents for Medicare Annual (Subsequent) preventive examination. ?Virtual Visit via Telephone Note ? ?I connected with  Sheila Armstrong on 03/14/22 at  3:45 PM EDT by telephone and verified that I am speaking with the correct person using two identifiers. ? ?Location: ?Patient: HOME ?Provider: [facility] ?Persons participating in the virtual visit: patient/Nurse Health Advisor ?  ?I discussed the limitations, risks, security and privacy concerns of performing an evaluation and management service by telephone and the availability of in person appointments. The patient expressed understanding and agreed to proceed. ? ?Interactive audio and video telecommunications were attempted between this nurse and patient, however failed, due to patient having technical difficulties OR patient did not have access to video capability.  We continued and completed visit with audio only. ? ?Some vital signs may be absent or patient reported.  ? ?Sheila Driver, LPN ? ?Review of Systems    ? ?Cardiac Risk Factors include: advanced age (>17mn, >>77women) ? ?   ?Objective:  ?  ?Today's Vitals  ? 03/14/22 1548  ?Weight: 139 lb (63 kg)  ?Height: '5\' 8"'$  (1.727 m)  ? ?Body mass index is 21.13 kg/m?. ? ? ?  03/14/2022  ?  3:52 PM 02/20/2022  ? 11:09 AM 02/14/2021  ? 11:16 AM 08/01/2020  ?  9:19 AM 01/26/2020  ? 11:48 AM 01/20/2019  ? 10:51 AM 10/19/2018  ? 10:55 AM  ?Advanced Directives  ?Does Patient Have a Medical Advance Directive? Yes Yes Yes Yes Yes Yes Yes  ?Type of [place]amed[place]of [place] will [place]Living will HBear Valley SpringsLiving will [place]Living will HC[place]Living will [place]Living will HH[place]Living will  ?Does patient want to make changes to medical advance directive?  No - Patient declined No - Patient declined       ?Copy of [place]in Chart? No - copy requested No - copy requested No - copy requested No - copy requested No - copy requested No - copy requested No - copy requested  ?Would patient like information on creating a medical advance directive? No - Patient declined        ? ? ?Current Medications (verified) ?Outpatient Encounter Medications as of 03/14/2022  ?Medication Sig  ? calcium-vitamin D (OSCAL WITH D) 500-200 MG-UNIT tablet Take 1 tablet by mouth daily with breakfast.  ? Cyanocobalamin (VITAMIN B12 PO)   ? EPINEPHrine 0.3 mg/0.3 mL IJ SOAJ injection   ? fluticasone (FLONASE) 50 MCG/ACT nasal spray 1-2 sprays  ? Multiple Vitamin (MULTIVITAMIN) capsule Take 1 capsule by mouth daily.  ? NON FORMULARY Allergy Injections- weekly  ? Probiotic Product (ALIGN) 4 MG CAPS Take 1 tablet by mouth daily.  ? VITAMIN D PO   ? ?No facility-administered encounter medications on file as of 03/14/2022.  ? ? ?Allergies (verified) ?Amoxicillin, Sulfonamide derivatives, Sulfur, Thimerosal, and Sudafed [pseudoephedrine]  ? ?History: ?Past Medical History:  ?Diagnosis Date  ? Abnormal uterine bleeding   ? ALLERGIC RHINITIS 07/07/2007  ? COLONIC POLYPS, ADENOMATOUS, HX OF 11/19/2005  ? CONSTIPATION 12/31/2009  ? DIVERTICULOSIS, COLON 07/12/2008  ? [place]   ? Family history of colon cancer   ? Hearing loss   ? History of radiation therapy 04/07/17-04/30/17  ? viaginal cuff treated to 30 Gy in 5 fractions  ? History of radiation therapy 04/07/17,04/14/17,04/21/17,04/30/17  ?  endometrial- Dr. Sondra Come   ? SEBORRHEIC KERATOSIS, INFLAMED 03/21/2008  ? SENILE VAGINITIS 01/17/2008  ? ?Past Surgical History:  ?Procedure Laterality Date  ? BREAST BIOPSY    ? COSMETIC SURGERY    ? DILATION AND CURETTAGE OF UTERUS    ? EYELID IMPLANT    ? right and left  ? RHINOPLASTY    ? ROBOTIC ASSISTED TOTAL HYSTERECTOMY WITH BILATERAL SALPINGO OOPHERECTOMY Bilateral 02/24/2017  ? Procedure: XI ROBOTIC ASSISTED TOTAL HYSTERECTOMY WITH  BILATERAL SALPINGO OOPHORECTOMY;  Surgeon: [name], MD;  Location: [facility];  Service: Gynecology;  Laterality: Bilateral;  ? SENTINEL NODE BIOPSY N/A 02/24/2017  ? Procedure: SENTINEL NODE BIOPSY;  Surgeon: [name], MD;  Location: [facility];  Service: Gynecology;  Laterality: N/A;  ? TONSILECTOMY/ADENOIDECTOMY WITH MYRINGOTOMY    ? ?Family History  ?Problem Relation Age of Onset  ? Colon cancer Mother 56  ? Heart disease Father   ? Anuerysm Sister   ? Esophageal cancer Neg Hx   ? Stomach cancer Neg Hx   ? ?Social History  ? ?Socioeconomic History  ? Marital status: Married  ?  Spouse name: Not on file  ? Number of children: 2  ? Years of education: Not on file  ? Highest education level: Not on file  ?Occupational History  ? Occupation: Engineer, technical sales at [employer]  ?Tobacco Use  ? Smoking status: Never  ? Smokeless tobacco: Never  ?Vaping Use  ? Vaping Use: Never used  ?Substance and Sexual Activity  ? Alcohol use: No  ? Drug use: No  ? Sexual activity: Never  ?  Partners: Male  ?Other Topics Concern  ? Not on file  ?Social History Narrative  ? Married for 9 years   ? Two children ( both live locally)   ? 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   ? She likes to travel   ? ?Social Determinants of Health  ? ?Financial Resource Strain: Low Risk   ? Difficulty of Paying Living Expenses: Not hard at all  ?Food Insecurity: No Food Insecurity  ? Worried About Charity fundraiser in the Last Year: Never true  ? Ran Out of Food in the Last Year: Never true  ?Transportation Needs: No Transportation Needs  ? Lack of Transportation (Medical): No  ? Lack of Transportation (Non-Medical): No  ?Physical Activity: Sufficiently Active  ? Days of Exercise per Week: 5 days  ? Minutes of Exercise per Session: 50 min  ?Stress: No Stress Concern Present  ? Feeling of Stress : Not at all  ?Social Connections: Socially Integrated  ? Frequency of Communication with Friends and Family: More than three times a week  ? Frequency of  Social Gatherings with Friends and Family: More than three times a week  ? Attends Religious Services: More than 4 times per year  ? Active Member of Clubs or Organizations: Yes  ? Attends Archivist Meetings: More than 4 times per year  ? Marital Status: Married  ? ? ?Tobacco Counseling ?Counseling given: Not Answered ? ? ?Clinical Intake: ? ?Pre-visit preparation completed: Yes ? ?Pain : No/denies pain ? ?  ? ?BMI - recorded: 21.13 ?Nutritional Status: BMI of 19-24  Normal ?Nutritional Risks: None ?Diabetes: No ? ?How often do you need to have someone help you when you read instructions, pamphlets, or other written materials from your doctor or pharmacy?: 1 - Never ? ?Diabetic?no ? ?Interpreter Needed?: No ? ?Information entered by :: Sheila Azael Ragain, lpn ? ? ?  Activities of Daily Living ? ?  03/14/2022  ?  3:53 PM 08/28/2021  ?  8:20 AM  ?In your present state of health, do you have any difficulty performing the following activities:  ?Hearing? 1 1  ?Comment  hearing aids both ears  ?Vision? 0 0  ?Difficulty concentrating or making decisions? 0 0  ?Walking or climbing stairs? 0 0  ?Dressing or bathing? 0 0  ?Doing errands, shopping? 0 0  ?Preparing Food and eating ? N   ?Using the Toilet? N   ?In the past six months, have you accidently leaked urine? N   ?Do you have problems with loss of bowel control? N   ?Managing your Medications? N   ?Managing your Finances? N   ?Housekeeping or managing your Housekeeping? N   ? ? ?Patient Care Team: ?Shelda Pal, DO as PCP - General (Family Medicine) ? ?Indicate any recent Medical Services you may have received from other than [facility] providers in the past year (date may be approximate). ? ?   ?Assessment:  ? This is a routine wellness examination for Sheila Armstrong. ? ?Hearing/Vision screen ?Hearing Screening - Comments:: Hearing aids.  ? ?Vision Screening - Comments:: Glasses. [facility]. 02/2022. ? ?Dietary issues and exercise activities  discussed: ?Current Exercise Habits: Home exercise routine, Type of exercise: walking;strength training/weights, Time (Minutes): 50, Frequency (Times/Week): 5, Weekly Exercise (Minutes/Week): 250, Intensity: Moderate, Exercise l

## 2022-03-14 NOTE — Patient Instructions (Signed)
Sheila Armstrong , ?Thank you for taking time to come for your Medicare Wellness Visit. I appreciate your ongoing commitment to your health goals. Please review the following plan we discussed and let me know if I can assist you in the future.  ? ?Screening recommendations/referrals: ?Colonoscopy: Done 06/16/2018. No longer required. ? ?Mammogram: Done 05/31/2021 Repeat annually ? ?Bone Density: Done 08/29/2021 Repeat every 2 years ? ?Recommended yearly ophthalmology/optometry visit for glaucoma screening and checkup ?Recommended yearly dental visit for hygiene and checkup ? ?Vaccinations: ?Influenza vaccine: Done 01/07/2022 Repeat annually ? ?Pneumococcal vaccine: Done 03/14/2014, 01/25/2009 and 11/11/2003 ?Tdap vaccine: Done 05/07/2021 Repeat in 10 years ? ?Shingles vaccine: Done 08/25/2019, 08/07/2018 and 05/17/2010.   ?Covid-19:Done 10/08/2021, 07/03/2020 and 12/09/2019. ? ?Advanced directives: Please bring a copy of your health care power of attorney and living will to the office to be added to your chart at your convenience. ? ? ?Conditions/risks identified: KEEP UP THE GOOD WORK!! ? ?Next appointment: Follow up in one year for your annual wellness visit 2024. ? ? ?Preventive Care 85 Years and Older, Female ?Preventive care refers to lifestyle choices and visits with your health care provider that can promote health and wellness. ?What does preventive care include? ?A yearly physical exam. This is also called an annual well check. ?Dental exams once or twice a year. ?Routine eye exams. Ask your health care provider how often you should have your eyes checked. ?Personal lifestyle choices, including: ?Daily care of your teeth and gums. ?Regular physical activity. ?Eating a healthy diet. ?Avoiding tobacco and drug use. ?Limiting alcohol use. ?Practicing safe sex. ?Taking low-dose aspirin every day. ?Taking vitamin and mineral supplements as recommended by your health care provider. ?What happens during an annual well check? ?The  services and screenings done by your health care provider during your annual well check will depend on your age, overall health, lifestyle risk factors, and family history of disease. ?Counseling  ?Your health care provider may ask you questions about your: ?Alcohol use. ?Tobacco use. ?Drug use. ?Emotional well-being. ?Home and relationship well-being. ?Sexual activity. ?Eating habits. ?History of falls. ?Memory and ability to understand (cognition). ?Work and work Statistician. ?Reproductive health. ?Screening  ?You may have the following tests or measurements: ?Height, weight, and BMI. ?Blood pressure. ?Lipid and cholesterol levels. These may be checked every 5 years, or more frequently if you are over 85 years old. ?Skin check. ?Lung cancer screening. You may have this screening every year starting at age 45 if you have a 30-pack-year history of smoking and currently smoke or have quit within the past 15 years. ?Fecal occult blood test (FOBT) of the stool. You may have this test every year starting at age 59. ?Flexible sigmoidoscopy or colonoscopy. You may have a sigmoidoscopy every 5 years or a colonoscopy every 10 years starting at age 45. ?Hepatitis C blood test. ?Hepatitis B blood test. ?Sexually transmitted disease (STD) testing. ?Diabetes screening. This is done by checking your blood sugar (glucose) after you have not eaten for a while (fasting). You may have this done every 1-3 years. ?Bone density scan. This is done to screen for osteoporosis. You may have this done starting at age 91. ?Mammogram. This may be done every 1-2 years. Talk to your health care provider about how often you should have regular mammograms. ?Talk with your health care provider about your test results, treatment options, and if necessary, the need for more tests. ?Vaccines  ?Your health care provider may recommend certain vaccines, such as: ?Influenza  vaccine. This is recommended every year. ?Tetanus, diphtheria, and acellular  pertussis (Tdap, Td) vaccine. You may need a Td booster every 10 years. ?Zoster vaccine. You may need this after age 49. ?Pneumococcal 13-valent conjugate (PCV13) vaccine. One dose is recommended after age 73. ?Pneumococcal polysaccharide (PPSV23) vaccine. One dose is recommended after age 40. ?Talk to your health care provider about which screenings and vaccines you need and how often you need them. ?This information is not intended to replace advice given to you by your health care provider. Make sure you discuss any questions you have with your health care provider. ?Document Released: 11/23/2015 Document Revised: 07/16/2016 Document Reviewed: 08/28/2015 ?Elsevier Interactive Patient Education ? 2017 [place]. ? ?Fall Prevention in the Home ?Falls can cause injuries. They can happen to people of all ages. There are many things you can do to make your home safe and to help prevent falls. ?What can I do on the outside of my home? ?Regularly fix the edges of walkways and driveways and fix any cracks. ?Remove anything that might make you trip as you walk through a door, such as a raised step or threshold. ?Trim any bushes or trees on the path to your home. ?Use bright outdoor lighting. ?Clear any walking paths of anything that might make someone trip, such as rocks or tools. ?Regularly check to see if handrails are loose or broken. Make sure that both sides of any steps have handrails. ?Any raised decks and porches should have guardrails on the edges. ?Have any leaves, snow, or ice cleared regularly. ?Use sand or salt on walking paths during winter. ?Clean up any spills in your garage right away. This includes oil or grease spills. ?What can I do in the bathroom? ?Use night lights. ?Install grab bars by the toilet and in the tub and shower. Do not use towel bars as grab bars. ?Use non-skid mats or decals in the tub or shower. ?If you need to sit down in the shower, use a plastic, non-slip stool. ?Keep the floor  dry. Clean up any water that spills on the floor as soon as it happens. ?Remove soap buildup in the tub or shower regularly. ?Attach bath mats securely with double-sided non-slip rug tape. ?Do not have throw rugs and other things on the floor that can make you trip. ?What can I do in the bedroom? ?Use night lights. ?Make sure that you have a light by your bed that is easy to reach. ?Do not use any sheets or blankets that are too big for your bed. They should not hang down onto the floor. ?Have a firm chair that has side arms. You can use this for support while you get dressed. ?Do not have throw rugs and other things on the floor that can make you trip. ?What can I do in the kitchen? ?Clean up any spills right away. ?Avoid walking on wet floors. ?Keep items that you use a lot in easy-to-reach places. ?If you need to reach something above you, use a strong step stool that has a grab bar. ?Keep electrical cords out of the way. ?Do not use floor polish or wax that makes floors slippery. If you must use wax, use non-skid floor wax. ?Do not have throw rugs and other things on the floor that can make you trip. ?What can I do with my stairs? ?Do not leave any items on the stairs. ?Make sure that there are handrails on both sides of the stairs and use them. Fix  handrails that are broken or loose. Make sure that handrails are as long as the stairways. ?Check any carpeting to make sure that it is firmly attached to the stairs. Fix any carpet that is loose or worn. ?Avoid having throw rugs at the top or bottom of the stairs. If you do have throw rugs, attach them to the floor with carpet tape. ?Make sure that you have a light switch at the top of the stairs and the bottom of the stairs. If you do not have them, ask someone to add them for you. ?What else can I do to help prevent falls? ?Wear shoes that: ?Do not have high heels. ?Have rubber bottoms. ?Are comfortable and fit you well. ?Are closed at the toe. Do not wear  sandals. ?If you use a stepladder: ?Make sure that it is fully opened. Do not climb a closed stepladder. ?Make sure that both sides of the stepladder are locked into place. ?Ask someone to hold it for you, if possib

## 2022-03-17 DIAGNOSIS — J301 Allergic rhinitis due to pollen: Secondary | ICD-10-CM | POA: Diagnosis not present

## 2022-03-17 DIAGNOSIS — J3089 Other allergic rhinitis: Secondary | ICD-10-CM | POA: Diagnosis not present

## 2022-03-24 ENCOUNTER — Ambulatory Visit: Payer: Medicare PPO | Admitting: Family Medicine

## 2022-03-24 ENCOUNTER — Encounter: Payer: Self-pay | Admitting: Family Medicine

## 2022-03-24 VITALS — BP 116/78 | HR 77 | Temp 98.1°F | Ht 68.0 in | Wt 142.0 lb

## 2022-03-24 DIAGNOSIS — J3089 Other allergic rhinitis: Secondary | ICD-10-CM | POA: Diagnosis not present

## 2022-03-24 DIAGNOSIS — H6123 Impacted cerumen, bilateral: Secondary | ICD-10-CM | POA: Diagnosis not present

## 2022-03-24 DIAGNOSIS — J301 Allergic rhinitis due to pollen: Secondary | ICD-10-CM | POA: Diagnosis not present

## 2022-03-24 NOTE — Patient Instructions (Signed)
OK to use Debrox (peroxide) in the ear to loosen up wax. Also recommend using a bulb syringe (for removing boogers from baby's noses) to flush through warm water and vinegar (3-4:1 ratio). An alternative, though more expensive, is an elephant ear washer wax removal kit. Do not use Q-tips as this can impact wax further.  Let us know if you need anything.  

## 2022-03-24 NOTE — Progress Notes (Signed)
Chief Complaint  ?Patient presents with  ? Ear Fullness  ? ? ?Subjective: ?Patient is a 87 y.o. female here for ear wax. ? ?Went to get hearing aids and they saw wax in her ears last week. No pain, fevers, drainage, trauma. She does not use Q tips. Has hx of wax buildup.  ? ?Past Medical History:  ?Diagnosis Date  ? Abnormal uterine bleeding   ? ALLERGIC RHINITIS 07/07/2007  ? COLONIC POLYPS, ADENOMATOUS, HX OF 11/19/2005  ? CONSTIPATION 12/31/2009  ? DIVERTICULOSIS, COLON 07/12/2008  ? Endometrial cancer ([place])   ? Family history of colon cancer   ? Hearing loss   ? History of radiation therapy 04/07/17-04/30/17  ? viaginal cuff treated to 30 Gy in 5 fractions  ? History of radiation therapy 04/07/17,04/14/17,04/21/17,04/30/17  ? endometrial- Dr. Sondra Come   ? SEBORRHEIC KERATOSIS, INFLAMED 03/21/2008  ? SENILE VAGINITIS 01/17/2008  ? ? ?Objective: ?BP 116/78   Pulse 77   Temp 98.1 ?F (36.7 ?C) (Oral)   Ht '5\' 8"'$  (1.727 m)   Wt 142 lb (64.4 kg)   SpO2 98%   BMI 21.59 kg/m?  ?General: Awake, appears stated age ?Ears: Canals 100% obstructed with cerumen bilaterally, no TTP over the external ear bilaterally ?Lungs: No accessory muscle use ?Psych: Age appropriate judgment and insight, normal affect and mood ? ?Assessment and Plan: ?Bilateral impacted cerumen ? ?Cerumen successfully removed with lavage performed by nursing team.  Aftercare and home management instructions written down and paperwork. ?The patient voiced understanding and agreement to the plan. ? ?Shelda Pal, DO ?03/24/22  ?3:18 PM ? ? ? ? ?

## 2022-04-02 ENCOUNTER — Telehealth: Payer: Self-pay

## 2022-04-02 ENCOUNTER — Other Ambulatory Visit: Payer: Self-pay

## 2022-04-02 DIAGNOSIS — C541 Malignant neoplasm of endometrium: Secondary | ICD-10-CM

## 2022-04-02 NOTE — Telephone Encounter (Signed)
Received a call from patient stating the she was unable to reestablish care with Dr. Talbert Nan due to current insurance.   Called Dr. Gentry Fitz office to follow up , per scheduling office patient needs a referral from Dr. Sondra Come. Once referral has been sent, patient will be contacted for a follow up appointment. Called and made patient aware.

## 2022-04-08 DIAGNOSIS — J301 Allergic rhinitis due to pollen: Secondary | ICD-10-CM | POA: Diagnosis not present

## 2022-04-08 DIAGNOSIS — J3089 Other allergic rhinitis: Secondary | ICD-10-CM | POA: Diagnosis not present

## 2022-04-08 DIAGNOSIS — J3081 Allergic rhinitis due to animal (cat) (dog) hair and dander: Secondary | ICD-10-CM | POA: Diagnosis not present

## 2022-04-10 DIAGNOSIS — J3089 Other allergic rhinitis: Secondary | ICD-10-CM | POA: Diagnosis not present

## 2022-04-10 DIAGNOSIS — J301 Allergic rhinitis due to pollen: Secondary | ICD-10-CM | POA: Diagnosis not present

## 2022-04-14 DIAGNOSIS — J3089 Other allergic rhinitis: Secondary | ICD-10-CM | POA: Diagnosis not present

## 2022-04-14 DIAGNOSIS — J301 Allergic rhinitis due to pollen: Secondary | ICD-10-CM | POA: Diagnosis not present

## 2022-04-14 DIAGNOSIS — J3081 Allergic rhinitis due to animal (cat) (dog) hair and dander: Secondary | ICD-10-CM | POA: Diagnosis not present

## 2022-04-21 DIAGNOSIS — J301 Allergic rhinitis due to pollen: Secondary | ICD-10-CM | POA: Diagnosis not present

## 2022-04-21 DIAGNOSIS — J3089 Other allergic rhinitis: Secondary | ICD-10-CM | POA: Diagnosis not present

## 2022-04-22 ENCOUNTER — Other Ambulatory Visit: Payer: Self-pay | Admitting: Adult Health

## 2022-04-22 ENCOUNTER — Other Ambulatory Visit: Payer: Self-pay | Admitting: Family Medicine

## 2022-04-22 DIAGNOSIS — Z1231 Encounter for screening mammogram for malignant neoplasm of breast: Secondary | ICD-10-CM

## 2022-04-23 DIAGNOSIS — J301 Allergic rhinitis due to pollen: Secondary | ICD-10-CM | POA: Diagnosis not present

## 2022-04-23 DIAGNOSIS — J3089 Other allergic rhinitis: Secondary | ICD-10-CM | POA: Diagnosis not present

## 2022-04-28 DIAGNOSIS — J301 Allergic rhinitis due to pollen: Secondary | ICD-10-CM | POA: Diagnosis not present

## 2022-04-28 DIAGNOSIS — J3081 Allergic rhinitis due to animal (cat) (dog) hair and dander: Secondary | ICD-10-CM | POA: Diagnosis not present

## 2022-04-28 DIAGNOSIS — J3089 Other allergic rhinitis: Secondary | ICD-10-CM | POA: Diagnosis not present

## 2022-05-05 DIAGNOSIS — J301 Allergic rhinitis due to pollen: Secondary | ICD-10-CM | POA: Diagnosis not present

## 2022-05-05 DIAGNOSIS — J3089 Other allergic rhinitis: Secondary | ICD-10-CM | POA: Diagnosis not present

## 2022-05-14 DIAGNOSIS — J301 Allergic rhinitis due to pollen: Secondary | ICD-10-CM | POA: Diagnosis not present

## 2022-05-14 DIAGNOSIS — J3089 Other allergic rhinitis: Secondary | ICD-10-CM | POA: Diagnosis not present

## 2022-05-19 DIAGNOSIS — J3089 Other allergic rhinitis: Secondary | ICD-10-CM | POA: Diagnosis not present

## 2022-05-19 DIAGNOSIS — J301 Allergic rhinitis due to pollen: Secondary | ICD-10-CM | POA: Diagnosis not present

## 2022-05-19 DIAGNOSIS — J3081 Allergic rhinitis due to animal (cat) (dog) hair and dander: Secondary | ICD-10-CM | POA: Diagnosis not present

## 2022-05-26 DIAGNOSIS — J301 Allergic rhinitis due to pollen: Secondary | ICD-10-CM | POA: Diagnosis not present

## 2022-05-26 DIAGNOSIS — J3089 Other allergic rhinitis: Secondary | ICD-10-CM | POA: Diagnosis not present

## 2022-05-26 DIAGNOSIS — J3081 Allergic rhinitis due to animal (cat) (dog) hair and dander: Secondary | ICD-10-CM | POA: Diagnosis not present

## 2022-06-02 ENCOUNTER — Ambulatory Visit
Admission: RE | Admit: 2022-06-02 | Discharge: 2022-06-02 | Disposition: A | Discharge status: Home / Self Care | Payer: Medicare PPO | Source: Ambulatory Visit | Attending: Family Medicine | Admitting: Family Medicine

## 2022-06-02 DIAGNOSIS — Z1231 Encounter for screening mammogram for malignant neoplasm of breast: Secondary | ICD-10-CM | POA: Diagnosis not present

## 2022-06-09 DIAGNOSIS — J3089 Other allergic rhinitis: Secondary | ICD-10-CM | POA: Diagnosis not present

## 2022-06-09 DIAGNOSIS — J301 Allergic rhinitis due to pollen: Secondary | ICD-10-CM | POA: Diagnosis not present

## 2022-06-09 DIAGNOSIS — L820 Inflamed seborrheic keratosis: Secondary | ICD-10-CM | POA: Diagnosis not present

## 2022-06-16 DIAGNOSIS — J3089 Other allergic rhinitis: Secondary | ICD-10-CM | POA: Diagnosis not present

## 2022-06-23 DIAGNOSIS — J3081 Allergic rhinitis due to animal (cat) (dog) hair and dander: Secondary | ICD-10-CM | POA: Diagnosis not present

## 2022-06-23 DIAGNOSIS — J301 Allergic rhinitis due to pollen: Secondary | ICD-10-CM | POA: Diagnosis not present

## 2022-06-23 DIAGNOSIS — J3089 Other allergic rhinitis: Secondary | ICD-10-CM | POA: Diagnosis not present

## 2022-06-25 ENCOUNTER — Ambulatory Visit: Payer: Medicare PPO | Admitting: Family Medicine

## 2022-06-25 ENCOUNTER — Encounter: Payer: Self-pay | Admitting: Family Medicine

## 2022-06-25 VITALS — BP 120/72 | HR 89 | Temp 97.9°F | Ht 68.0 in | Wt 144.4 lb

## 2022-06-25 DIAGNOSIS — R42 Dizziness and giddiness: Secondary | ICD-10-CM

## 2022-06-25 NOTE — Patient Instructions (Signed)
Give [place] 2-3 business days to get the results of your labs back.   Stay hydrated.  Eat regularly.   Avoid extreme heat.   Let us know if you need anything.

## 2022-06-25 NOTE — Progress Notes (Signed)
Chief Complaint  Patient presents with   Dizziness    Sheila Armstrong is 87 y.o. pt here for dizziness.  Duration: 1 day; was outside doing yard work leading into it Lasted for 1 hour and resolved, no issues since then.  Pass out? No Spinning? No Recent illness/fever? No Headache? No Neurologic signs? No Change in PO intake? No No N/V/D No new supplements or alcohol/illicit med list No areas of easy bruising/bleeding.   Past Medical History:  Diagnosis Date   Abnormal uterine bleeding    ALLERGIC RHINITIS 07/07/2007   COLONIC POLYPS, ADENOMATOUS, HX OF 11/19/2005   CONSTIPATION 12/31/2009   DIVERTICULOSIS, COLON 07/12/2008   Endometrial cancer ([place])    Family history of colon cancer    Hearing loss    History of radiation therapy 04/07/17-04/30/17   viaginal cuff treated to 30 Gy in 5 fractions   History of radiation therapy 04/07/17,04/14/17,04/21/17,04/30/17   endometrial- Dr. Sondra Come    SEBORRHEIC KERATOSIS, INFLAMED 03/21/2008   SENILE VAGINITIS 01/17/2008    Family History  Problem Relation Age of Onset   Colon cancer Mother 45   Heart disease Father    Anuerysm Sister    Esophageal cancer Neg Hx    Stomach cancer Neg Hx     Allergies as of 06/25/2022       Reactions   Amoxicillin Diarrhea   Sulfonamide Derivatives Hives   Sulfur Hives   Thimerosal    Swelling of eyelids from eye drops   Sudafed [pseudoephedrine] Palpitations        Medication List        Accurate as of June 25, 2022  2:31 PM. If you have any questions, ask your nurse or doctor.          Align 4 MG Caps Take 1 tablet by mouth daily.   calcium-vitamin D 500-200 MG-UNIT tablet Commonly known as: OSCAL WITH D Take 1 tablet by mouth daily with breakfast.   EPINEPHrine 0.3 mg/0.3 mL Soaj injection Commonly known as: EPI-PEN   fluticasone 50 MCG/ACT nasal spray Commonly known as: FLONASE 1-2 sprays   multivitamin capsule Take 1 capsule by mouth daily.   NON FORMULARY Allergy  Injections- weekly   VITAMIN B12 PO   VITAMIN D PO        BP 120/72   Pulse 89   Temp 97.9 F (36.6 C) (Oral)   Ht '5\' 8"'$  (1.727 m)   Wt 144 lb 6 oz (65.5 kg)   SpO2 98%   BMI 21.95 kg/m  General: Awake, alert, appears stated age Eyes: PERRLA, EOMi Ears: Patent, TM's neg b/l Mouth: MMM Heart: RRR, no murmurs, no carotid bruits Lungs: CTAB, no accessory muscle use MSK: 5/5 strength throughout, gait normal Neuro: No cerebellar signs, patellar reflex 1/4 b/l wo clonus, calcaneal reflex 0/4 b/l wo clonus, biceps reflex 1/4 b/l wo clonus; Dix-Hall-Pike negative b/l. Psych: Age appropriate judgment and insight, normal mood and affect  Lightheaded - Plan: CBC, Comprehensive metabolic panel  Check labs.  Does not seem like an arrhythmia, heart failure, carotid atherosclerosis, blood pressure, sugar.  Will check for anemia or metabolic derangements.  Encouraged hydration.  Offered to check an echo but we will see what the labs show first. F/u prn. Pt voiced understanding and agreement to the plan.  I spent 30 minutes with the patient discussing the above plan in addition to reviewing her chart on the same at the visit.  [place], DO 06/25/22 2:31 PM

## 2022-06-26 LAB — COMPREHENSIVE METABOLIC PANEL
ALT: 11 U/L (ref 0–35)
AST: 20 U/L (ref 0–37)
Albumin: 4.2 g/dL (ref 3.5–5.2)
Alkaline Phosphatase: 49 U/L (ref 39–117)
BUN: 21 mg/dL (ref 6–23)
CO2: 27 mEq/L (ref 19–32)
Calcium: 9.4 mg/dL (ref 8.4–10.5)
Chloride: 99 mEq/L (ref 96–112)
Creatinine, Ser: 1.02 mg/dL (ref 0.40–1.20)
GFR: 50.42 mL/min — ABNORMAL LOW (ref >60.00)
Glucose, Bld: 81 mg/dL (ref 70–99)
Potassium: 4.2 mEq/L (ref 3.5–5.1)
Sodium: 135 mEq/L (ref 135–145)
Total Bilirubin: 0.3 mg/dL (ref 0.2–1.2)
Total Protein: 7 g/dL (ref 6.0–8.3)

## 2022-06-26 LAB — CBC
HCT: 32.6 % — ABNORMAL LOW (ref 36.0–46.0)
Hemoglobin: 10.9 g/dL — ABNORMAL LOW (ref 12.0–15.0)
MCHC: 33.6 g/dL (ref 30.0–36.0)
MCV: 94.8 fl (ref 78.0–100.0)
Platelets: 234 10*3/uL (ref 150.0–400.0)
RBC: 3.43 Mil/uL — ABNORMAL LOW (ref 3.87–5.11)
RDW: 13.1 % (ref 11.5–15.5)
WBC: 6.8 10*3/uL (ref 4.0–10.5)

## 2022-06-27 ENCOUNTER — Other Ambulatory Visit: Payer: Self-pay | Admitting: *Deleted

## 2022-06-27 ENCOUNTER — Encounter: Payer: Self-pay | Admitting: Family Medicine

## 2022-06-27 ENCOUNTER — Other Ambulatory Visit (INDEPENDENT_AMBULATORY_CARE_PROVIDER_SITE_OTHER): Payer: Medicare PPO

## 2022-06-27 DIAGNOSIS — D649 Anemia, unspecified: Secondary | ICD-10-CM

## 2022-06-27 LAB — IBC + FERRITIN
Ferritin: 80.7 ng/mL (ref 10.0–291.0)
Iron: 58 ug/dL (ref 42–145)
Saturation Ratios: 15.5 % — ABNORMAL LOW (ref 20.0–50.0)
TIBC: 375.2 ug/dL (ref 250.0–450.0)
Transferrin: 268 mg/dL (ref 212.0–360.0)

## 2022-06-27 LAB — VITAMIN B12: Vitamin B-12: 988 pg/mL — ABNORMAL HIGH (ref 211–911)

## 2022-06-27 LAB — FOLATE: Folate: >24.2 ng/mL (ref >5.9)

## 2022-06-27 NOTE — Addendum Note (Signed)
Addended by: Kelle Darting A on: 06/27/2022 03:14 PM   Modules accepted: Orders

## 2022-06-30 DIAGNOSIS — J3081 Allergic rhinitis due to animal (cat) (dog) hair and dander: Secondary | ICD-10-CM | POA: Diagnosis not present

## 2022-06-30 DIAGNOSIS — J301 Allergic rhinitis due to pollen: Secondary | ICD-10-CM | POA: Diagnosis not present

## 2022-06-30 DIAGNOSIS — J3089 Other allergic rhinitis: Secondary | ICD-10-CM | POA: Diagnosis not present

## 2022-07-02 ENCOUNTER — Other Ambulatory Visit: Payer: Medicare PPO

## 2022-07-15 DIAGNOSIS — J3081 Allergic rhinitis due to animal (cat) (dog) hair and dander: Secondary | ICD-10-CM | POA: Diagnosis not present

## 2022-07-15 DIAGNOSIS — J301 Allergic rhinitis due to pollen: Secondary | ICD-10-CM | POA: Diagnosis not present

## 2022-07-15 DIAGNOSIS — J3089 Other allergic rhinitis: Secondary | ICD-10-CM | POA: Diagnosis not present

## 2022-07-21 DIAGNOSIS — J3089 Other allergic rhinitis: Secondary | ICD-10-CM | POA: Diagnosis not present

## 2022-07-21 DIAGNOSIS — J301 Allergic rhinitis due to pollen: Secondary | ICD-10-CM | POA: Diagnosis not present

## 2022-07-28 DIAGNOSIS — J3089 Other allergic rhinitis: Secondary | ICD-10-CM | POA: Diagnosis not present

## 2022-07-28 DIAGNOSIS — J301 Allergic rhinitis due to pollen: Secondary | ICD-10-CM | POA: Diagnosis not present

## 2022-08-04 DIAGNOSIS — J3081 Allergic rhinitis due to animal (cat) (dog) hair and dander: Secondary | ICD-10-CM | POA: Diagnosis not present

## 2022-08-04 DIAGNOSIS — J301 Allergic rhinitis due to pollen: Secondary | ICD-10-CM | POA: Diagnosis not present

## 2022-08-04 DIAGNOSIS — J3089 Other allergic rhinitis: Secondary | ICD-10-CM | POA: Diagnosis not present

## 2022-08-11 DIAGNOSIS — J3081 Allergic rhinitis due to animal (cat) (dog) hair and dander: Secondary | ICD-10-CM | POA: Diagnosis not present

## 2022-08-11 DIAGNOSIS — J3089 Other allergic rhinitis: Secondary | ICD-10-CM | POA: Diagnosis not present

## 2022-08-11 DIAGNOSIS — J301 Allergic rhinitis due to pollen: Secondary | ICD-10-CM | POA: Diagnosis not present

## 2022-08-18 DIAGNOSIS — J301 Allergic rhinitis due to pollen: Secondary | ICD-10-CM | POA: Diagnosis not present

## 2022-08-18 DIAGNOSIS — J3089 Other allergic rhinitis: Secondary | ICD-10-CM | POA: Diagnosis not present

## 2022-08-26 DIAGNOSIS — J3081 Allergic rhinitis due to animal (cat) (dog) hair and dander: Secondary | ICD-10-CM | POA: Diagnosis not present

## 2022-08-26 DIAGNOSIS — J3089 Other allergic rhinitis: Secondary | ICD-10-CM | POA: Diagnosis not present

## 2022-08-26 DIAGNOSIS — J301 Allergic rhinitis due to pollen: Secondary | ICD-10-CM | POA: Diagnosis not present

## 2022-08-27 DIAGNOSIS — J301 Allergic rhinitis due to pollen: Secondary | ICD-10-CM | POA: Diagnosis not present

## 2022-08-27 DIAGNOSIS — J3089 Other allergic rhinitis: Secondary | ICD-10-CM | POA: Diagnosis not present

## 2022-08-29 DIAGNOSIS — Z961 Presence of intraocular lens: Secondary | ICD-10-CM | POA: Diagnosis not present

## 2022-08-29 DIAGNOSIS — H524 Presbyopia: Secondary | ICD-10-CM | POA: Diagnosis not present

## 2022-08-29 DIAGNOSIS — H532 Diplopia: Secondary | ICD-10-CM | POA: Diagnosis not present

## 2022-09-01 DIAGNOSIS — J301 Allergic rhinitis due to pollen: Secondary | ICD-10-CM | POA: Diagnosis not present

## 2022-09-01 DIAGNOSIS — J3081 Allergic rhinitis due to animal (cat) (dog) hair and dander: Secondary | ICD-10-CM | POA: Diagnosis not present

## 2022-09-01 DIAGNOSIS — J3089 Other allergic rhinitis: Secondary | ICD-10-CM | POA: Diagnosis not present

## 2022-09-02 ENCOUNTER — Encounter: Payer: Self-pay | Admitting: Family Medicine

## 2022-09-02 ENCOUNTER — Ambulatory Visit (INDEPENDENT_AMBULATORY_CARE_PROVIDER_SITE_OTHER): Payer: Medicare PPO | Admitting: Family Medicine

## 2022-09-02 VITALS — BP 110/68 | HR 93 | Temp 97.7°F | Ht 68.0 in | Wt 141.1 lb

## 2022-09-02 DIAGNOSIS — E7800 Pure hypercholesterolemia, unspecified: Secondary | ICD-10-CM

## 2022-09-02 DIAGNOSIS — Z0000 Encounter for general adult medical examination without abnormal findings: Secondary | ICD-10-CM

## 2022-09-02 LAB — COMPREHENSIVE METABOLIC PANEL
ALT: 10 U/L (ref 0–35)
AST: 19 U/L (ref 0–37)
Albumin: 4.1 g/dL (ref 3.5–5.2)
Alkaline Phosphatase: 45 U/L (ref 39–117)
BUN: 19 mg/dL (ref 6–23)
CO2: 28 mEq/L (ref 19–32)
Calcium: 9.4 mg/dL (ref 8.4–10.5)
Chloride: 102 mEq/L (ref 96–112)
Creatinine, Ser: 0.9 mg/dL (ref 0.40–1.20)
GFR: 58.51 mL/min — ABNORMAL LOW (ref >60.00)
Glucose, Bld: 80 mg/dL (ref 70–99)
Potassium: 4.1 mEq/L (ref 3.5–5.1)
Sodium: 137 mEq/L (ref 135–145)
Total Bilirubin: 0.5 mg/dL (ref 0.2–1.2)
Total Protein: 6.9 g/dL (ref 6.0–8.3)

## 2022-09-02 LAB — LIPID PANEL
Cholesterol: 152 mg/dL (ref 0–200)
HDL: 50.5 mg/dL (ref >39.00)
LDL Cholesterol: 85 mg/dL (ref 0–99)
NonHDL: 101.47
Total CHOL/HDL Ratio: 3
Triglycerides: 83 mg/dL (ref 0.0–149.0)
VLDL: 16.6 mg/dL (ref 0.0–40.0)

## 2022-09-02 NOTE — Patient Instructions (Addendum)
Give [place] 2-3 business days to get the results of your labs back.   Keep the diet clean and stay active.  Start using Flonase for the next month to help with the clicking in your ear. If no improvement in the next month, let me know.  Please get me a copy of your advanced directive form at your convenience.   Let us know if you need anything.

## 2022-09-02 NOTE — Progress Notes (Signed)
Chief Complaint  Patient presents with   Annual Exam     Well Woman Sheila Armstrong is here for a complete physical.   Her last physical was >1 year ago.  Current diet: in general, a "healthy" diet. Current exercise: lifting wts, exercise classes. Weight is stable and she denies daytime fatigue. Seatbelt? Yes Advanced directive? Yes  Health Maintenance Shingrix- Yes DEXA- Yes Tetanus- Yes Pneumonia- Yes  Past Medical History:  Diagnosis Date   Abnormal uterine bleeding    ALLERGIC RHINITIS 07/07/2007   COLONIC POLYPS, ADENOMATOUS, HX OF 11/19/2005   CONSTIPATION 12/31/2009   DIVERTICULOSIS, COLON 07/12/2008   Endometrial cancer ([place])    Family history of colon cancer    Hearing loss    History of radiation therapy 04/07/17-04/30/17   viaginal cuff treated to 30 Gy in 5 fractions   History of radiation therapy 04/07/17,04/14/17,04/21/17,04/30/17   endometrial- Dr. Sondra Come    SEBORRHEIC KERATOSIS, INFLAMED 03/21/2008   SENILE VAGINITIS 01/17/2008     Past Surgical History:  Procedure Laterality Date   BREAST BIOPSY     COSMETIC SURGERY     DILATION AND CURETTAGE OF UTERUS     EYELID IMPLANT     right and left   RHINOPLASTY     ROBOTIC ASSISTED TOTAL HYSTERECTOMY WITH BILATERAL SALPINGO OOPHERECTOMY Bilateral 02/24/2017   Procedure: XI ROBOTIC ASSISTED TOTAL HYSTERECTOMY WITH BILATERAL SALPINGO OOPHORECTOMY;  Surgeon: [name], MD;  Location: [facility];  Service: Gynecology;  Laterality: Bilateral;   SENTINEL NODE BIOPSY N/A 02/24/2017   Procedure: SENTINEL NODE BIOPSY;  Surgeon: [name], MD;  Location: [facility];  Service: Gynecology;  Laterality: N/A;   TONSILECTOMY/ADENOIDECTOMY WITH MYRINGOTOMY      Medications  Current Outpatient Medications on File Prior to Visit  Medication Sig Dispense Refill   calcium-vitamin D (OSCAL WITH D) 500-200 MG-UNIT tablet Take 1 tablet by mouth daily with breakfast.     Cyanocobalamin (VITAMIN B12 PO)      EPINEPHrine 0.3 mg/0.3 mL IJ  SOAJ injection   0   fluticasone (FLONASE) 50 MCG/ACT nasal spray 1-2 sprays     Multiple Vitamin (MULTIVITAMIN) capsule Take 1 capsule by mouth daily.     NON FORMULARY Allergy Injections- weekly     Probiotic Product (ALIGN) 4 MG CAPS Take 1 tablet by mouth daily.     VITAMIN D PO      Allergies Allergies  Allergen Reactions   Amoxicillin Diarrhea   Sulfonamide Derivatives Hives   Sulfur Hives   Thimerosal (Thiomersal)     Swelling of eyelids from eye drops   Sudafed [Pseudoephedrine] Palpitations    Review of Systems: Constitutional:  no fevers Eye:  no recent significant change in vision Ears:  No changes in hearing Nose/Mouth/Throat:  no complaints of nasal congestion, no sore throat Cardiovascular: no chest pain Respiratory:  No shortness of breath Gastrointestinal:  No change in bowel habits GU:  Female: negative for dysuria Integumentary:  no abnormal skin lesions reported Neurologic:  no headaches Endocrine:  denies unexplained weight changes  Exam BP 110/68 (BP Location: Left Arm, Patient Position: Sitting, Cuff Size: Normal)   Pulse 93   Temp 97.7 F (36.5 C) (Oral)   Ht '5\' 8"'$  (1.727 m)   Wt 141 lb 2 oz (64 kg)   SpO2 96%   BMI 21.46 kg/m  General:  well developed, well nourished, in no apparent distress Skin:  no significant moles, warts, or growths Head:  no masses, lesions, or tenderness Eyes:  pupils equal and round, sclera anicteric without injection Ears:  canals without lesions, TMs shiny without retraction, no obvious effusion, no erythema Nose:  nares patent, mucosa normal, and no drainage Throat/Pharynx:  lips and gingiva without lesion; tongue and uvula midline; non-inflamed pharynx; no exudates or postnasal drainage Neck: neck supple without adenopathy, thyromegaly, or masses Lungs:  clear to auscultation, breath sounds equal bilaterally, no respiratory distress Cardio:  regular rate and rhythm, no bruits or LE edema Abdomen:  abdomen soft,  nontender; bowel sounds normal; no masses or organomegaly Genital: Deferred Neuro:  gait normal; deep tendon reflexes normal and symmetric Psych: well oriented with normal range of affect and appropriate judgment/insight  Assessment and Plan  Well adult exam  Pure hypercholesterolemia - Plan: Lipid panel, Comprehensive metabolic panel   Well 87 y.o. female. Counseled on diet and exercise. Other orders as above. Advanced directive form requested today.  Follow up in 1 yr. The patient voiced understanding and agreement to the plan.  [place], DO 09/02/22 9:07 AM

## 2022-09-08 DIAGNOSIS — J301 Allergic rhinitis due to pollen: Secondary | ICD-10-CM | POA: Diagnosis not present

## 2022-09-08 DIAGNOSIS — J3089 Other allergic rhinitis: Secondary | ICD-10-CM | POA: Diagnosis not present

## 2022-09-08 DIAGNOSIS — J3081 Allergic rhinitis due to animal (cat) (dog) hair and dander: Secondary | ICD-10-CM | POA: Diagnosis not present

## 2022-09-15 DIAGNOSIS — J3081 Allergic rhinitis due to animal (cat) (dog) hair and dander: Secondary | ICD-10-CM | POA: Diagnosis not present

## 2022-09-15 DIAGNOSIS — J3089 Other allergic rhinitis: Secondary | ICD-10-CM | POA: Diagnosis not present

## 2022-09-15 DIAGNOSIS — J301 Allergic rhinitis due to pollen: Secondary | ICD-10-CM | POA: Diagnosis not present

## 2022-09-22 DIAGNOSIS — J3089 Other allergic rhinitis: Secondary | ICD-10-CM | POA: Diagnosis not present

## 2022-09-22 DIAGNOSIS — J3081 Allergic rhinitis due to animal (cat) (dog) hair and dander: Secondary | ICD-10-CM | POA: Diagnosis not present

## 2022-09-22 DIAGNOSIS — J301 Allergic rhinitis due to pollen: Secondary | ICD-10-CM | POA: Diagnosis not present

## 2022-09-26 ENCOUNTER — Encounter: Payer: Self-pay | Admitting: Family Medicine

## 2022-09-29 DIAGNOSIS — J3089 Other allergic rhinitis: Secondary | ICD-10-CM | POA: Diagnosis not present

## 2022-09-29 DIAGNOSIS — J3081 Allergic rhinitis due to animal (cat) (dog) hair and dander: Secondary | ICD-10-CM | POA: Diagnosis not present

## 2022-09-29 DIAGNOSIS — J301 Allergic rhinitis due to pollen: Secondary | ICD-10-CM | POA: Diagnosis not present

## 2022-10-06 DIAGNOSIS — J301 Allergic rhinitis due to pollen: Secondary | ICD-10-CM | POA: Diagnosis not present

## 2022-10-06 DIAGNOSIS — J3089 Other allergic rhinitis: Secondary | ICD-10-CM | POA: Diagnosis not present

## 2022-10-06 DIAGNOSIS — J3081 Allergic rhinitis due to animal (cat) (dog) hair and dander: Secondary | ICD-10-CM | POA: Diagnosis not present

## 2022-10-10 DIAGNOSIS — M25511 Pain in right shoulder: Secondary | ICD-10-CM | POA: Diagnosis not present

## 2022-10-12 ENCOUNTER — Encounter: Payer: Self-pay | Admitting: Family Medicine

## 2022-10-13 DIAGNOSIS — J3089 Other allergic rhinitis: Secondary | ICD-10-CM | POA: Diagnosis not present

## 2022-10-13 DIAGNOSIS — J301 Allergic rhinitis due to pollen: Secondary | ICD-10-CM | POA: Diagnosis not present

## 2022-10-13 DIAGNOSIS — J3081 Allergic rhinitis due to animal (cat) (dog) hair and dander: Secondary | ICD-10-CM | POA: Diagnosis not present

## 2022-10-20 DIAGNOSIS — J301 Allergic rhinitis due to pollen: Secondary | ICD-10-CM | POA: Diagnosis not present

## 2022-10-20 DIAGNOSIS — J3081 Allergic rhinitis due to animal (cat) (dog) hair and dander: Secondary | ICD-10-CM | POA: Diagnosis not present

## 2022-10-20 DIAGNOSIS — J3089 Other allergic rhinitis: Secondary | ICD-10-CM | POA: Diagnosis not present

## 2022-10-27 DIAGNOSIS — J301 Allergic rhinitis due to pollen: Secondary | ICD-10-CM | POA: Diagnosis not present

## 2022-10-27 DIAGNOSIS — J3089 Other allergic rhinitis: Secondary | ICD-10-CM | POA: Diagnosis not present

## 2022-10-27 DIAGNOSIS — J3081 Allergic rhinitis due to animal (cat) (dog) hair and dander: Secondary | ICD-10-CM | POA: Diagnosis not present

## 2022-11-05 DIAGNOSIS — J3081 Allergic rhinitis due to animal (cat) (dog) hair and dander: Secondary | ICD-10-CM | POA: Diagnosis not present

## 2022-11-05 DIAGNOSIS — J301 Allergic rhinitis due to pollen: Secondary | ICD-10-CM | POA: Diagnosis not present

## 2022-11-05 DIAGNOSIS — J3089 Other allergic rhinitis: Secondary | ICD-10-CM | POA: Diagnosis not present

## 2022-11-13 DIAGNOSIS — J3089 Other allergic rhinitis: Secondary | ICD-10-CM | POA: Diagnosis not present

## 2022-11-13 DIAGNOSIS — J301 Allergic rhinitis due to pollen: Secondary | ICD-10-CM | POA: Diagnosis not present

## 2022-11-13 DIAGNOSIS — J3081 Allergic rhinitis due to animal (cat) (dog) hair and dander: Secondary | ICD-10-CM | POA: Diagnosis not present

## 2022-11-18 DIAGNOSIS — J3089 Other allergic rhinitis: Secondary | ICD-10-CM | POA: Diagnosis not present

## 2022-11-18 DIAGNOSIS — J301 Allergic rhinitis due to pollen: Secondary | ICD-10-CM | POA: Diagnosis not present

## 2022-11-18 DIAGNOSIS — J3081 Allergic rhinitis due to animal (cat) (dog) hair and dander: Secondary | ICD-10-CM | POA: Diagnosis not present

## 2022-11-24 DIAGNOSIS — H6123 Impacted cerumen, bilateral: Secondary | ICD-10-CM | POA: Diagnosis not present

## 2022-11-27 DIAGNOSIS — J3089 Other allergic rhinitis: Secondary | ICD-10-CM | POA: Diagnosis not present

## 2022-11-27 DIAGNOSIS — J301 Allergic rhinitis due to pollen: Secondary | ICD-10-CM | POA: Diagnosis not present

## 2022-12-02 DIAGNOSIS — J301 Allergic rhinitis due to pollen: Secondary | ICD-10-CM | POA: Diagnosis not present

## 2022-12-02 DIAGNOSIS — J3081 Allergic rhinitis due to animal (cat) (dog) hair and dander: Secondary | ICD-10-CM | POA: Diagnosis not present

## 2022-12-02 DIAGNOSIS — J3089 Other allergic rhinitis: Secondary | ICD-10-CM | POA: Diagnosis not present

## 2022-12-08 DIAGNOSIS — J301 Allergic rhinitis due to pollen: Secondary | ICD-10-CM | POA: Diagnosis not present

## 2022-12-08 DIAGNOSIS — J3089 Other allergic rhinitis: Secondary | ICD-10-CM | POA: Diagnosis not present

## 2022-12-08 DIAGNOSIS — J3081 Allergic rhinitis due to animal (cat) (dog) hair and dander: Secondary | ICD-10-CM | POA: Diagnosis not present

## 2022-12-09 DIAGNOSIS — L570 Actinic keratosis: Secondary | ICD-10-CM | POA: Diagnosis not present

## 2022-12-09 DIAGNOSIS — L821 Other seborrheic keratosis: Secondary | ICD-10-CM | POA: Diagnosis not present

## 2022-12-15 DIAGNOSIS — J3081 Allergic rhinitis due to animal (cat) (dog) hair and dander: Secondary | ICD-10-CM | POA: Diagnosis not present

## 2022-12-15 DIAGNOSIS — J301 Allergic rhinitis due to pollen: Secondary | ICD-10-CM | POA: Diagnosis not present

## 2022-12-15 DIAGNOSIS — J3089 Other allergic rhinitis: Secondary | ICD-10-CM | POA: Diagnosis not present

## 2022-12-24 DIAGNOSIS — J301 Allergic rhinitis due to pollen: Secondary | ICD-10-CM | POA: Diagnosis not present

## 2022-12-24 DIAGNOSIS — J3089 Other allergic rhinitis: Secondary | ICD-10-CM | POA: Diagnosis not present

## 2022-12-25 DIAGNOSIS — J3089 Other allergic rhinitis: Secondary | ICD-10-CM | POA: Diagnosis not present

## 2022-12-25 DIAGNOSIS — J301 Allergic rhinitis due to pollen: Secondary | ICD-10-CM | POA: Diagnosis not present

## 2022-12-25 DIAGNOSIS — J3081 Allergic rhinitis due to animal (cat) (dog) hair and dander: Secondary | ICD-10-CM | POA: Diagnosis not present

## 2022-12-29 DIAGNOSIS — J3089 Other allergic rhinitis: Secondary | ICD-10-CM | POA: Diagnosis not present

## 2022-12-29 DIAGNOSIS — J3081 Allergic rhinitis due to animal (cat) (dog) hair and dander: Secondary | ICD-10-CM | POA: Diagnosis not present

## 2022-12-29 DIAGNOSIS — J301 Allergic rhinitis due to pollen: Secondary | ICD-10-CM | POA: Diagnosis not present

## 2023-01-06 DIAGNOSIS — J3081 Allergic rhinitis due to animal (cat) (dog) hair and dander: Secondary | ICD-10-CM | POA: Diagnosis not present

## 2023-01-06 DIAGNOSIS — J3089 Other allergic rhinitis: Secondary | ICD-10-CM | POA: Diagnosis not present

## 2023-01-06 DIAGNOSIS — J301 Allergic rhinitis due to pollen: Secondary | ICD-10-CM | POA: Diagnosis not present

## 2023-01-12 DIAGNOSIS — J301 Allergic rhinitis due to pollen: Secondary | ICD-10-CM | POA: Diagnosis not present

## 2023-01-12 DIAGNOSIS — J3081 Allergic rhinitis due to animal (cat) (dog) hair and dander: Secondary | ICD-10-CM | POA: Diagnosis not present

## 2023-01-12 DIAGNOSIS — J3089 Other allergic rhinitis: Secondary | ICD-10-CM | POA: Diagnosis not present

## 2023-01-19 DIAGNOSIS — J301 Allergic rhinitis due to pollen: Secondary | ICD-10-CM | POA: Diagnosis not present

## 2023-01-19 DIAGNOSIS — J3081 Allergic rhinitis due to animal (cat) (dog) hair and dander: Secondary | ICD-10-CM | POA: Diagnosis not present

## 2023-01-19 DIAGNOSIS — J3089 Other allergic rhinitis: Secondary | ICD-10-CM | POA: Diagnosis not present

## 2023-01-28 DIAGNOSIS — J301 Allergic rhinitis due to pollen: Secondary | ICD-10-CM | POA: Diagnosis not present

## 2023-01-28 DIAGNOSIS — J3089 Other allergic rhinitis: Secondary | ICD-10-CM | POA: Diagnosis not present

## 2023-01-28 DIAGNOSIS — J3081 Allergic rhinitis due to animal (cat) (dog) hair and dander: Secondary | ICD-10-CM | POA: Diagnosis not present

## 2023-02-02 DIAGNOSIS — J301 Allergic rhinitis due to pollen: Secondary | ICD-10-CM | POA: Diagnosis not present

## 2023-02-02 DIAGNOSIS — J3089 Other allergic rhinitis: Secondary | ICD-10-CM | POA: Diagnosis not present

## 2023-02-09 DIAGNOSIS — J3081 Allergic rhinitis due to animal (cat) (dog) hair and dander: Secondary | ICD-10-CM | POA: Diagnosis not present

## 2023-02-09 DIAGNOSIS — J3089 Other allergic rhinitis: Secondary | ICD-10-CM | POA: Diagnosis not present

## 2023-02-09 DIAGNOSIS — J301 Allergic rhinitis due to pollen: Secondary | ICD-10-CM | POA: Diagnosis not present

## 2023-02-16 DIAGNOSIS — J301 Allergic rhinitis due to pollen: Secondary | ICD-10-CM | POA: Diagnosis not present

## 2023-02-16 DIAGNOSIS — J3081 Allergic rhinitis due to animal (cat) (dog) hair and dander: Secondary | ICD-10-CM | POA: Diagnosis not present

## 2023-02-16 DIAGNOSIS — J3089 Other allergic rhinitis: Secondary | ICD-10-CM | POA: Diagnosis not present

## 2023-02-23 ENCOUNTER — Encounter: Payer: Medicare Other | Admitting: Obstetrics and Gynecology

## 2023-02-24 DIAGNOSIS — J3089 Other allergic rhinitis: Secondary | ICD-10-CM | POA: Diagnosis not present

## 2023-02-24 DIAGNOSIS — J301 Allergic rhinitis due to pollen: Secondary | ICD-10-CM | POA: Diagnosis not present

## 2023-02-24 DIAGNOSIS — J3081 Allergic rhinitis due to animal (cat) (dog) hair and dander: Secondary | ICD-10-CM | POA: Diagnosis not present

## 2023-02-25 ENCOUNTER — Ambulatory Visit: Payer: Medicare PPO | Admitting: Obstetrics and Gynecology

## 2023-02-25 ENCOUNTER — Encounter: Payer: Self-pay | Admitting: Obstetrics and Gynecology

## 2023-02-25 VITALS — BP 132/80 | HR 76 | Ht 68.0 in | Wt 136.0 lb

## 2023-02-25 DIAGNOSIS — Z01419 Encounter for gynecological examination (general) (routine) without abnormal findings: Secondary | ICD-10-CM

## 2023-02-25 DIAGNOSIS — Z8542 Personal history of malignant neoplasm of other parts of uterus: Secondary | ICD-10-CM | POA: Diagnosis not present

## 2023-02-25 NOTE — Progress Notes (Signed)
88 y.o. [id number] Married White or Caucasian Not Hispanic or Latino female here for annual exam.  She has a h/o endometrial cancer, s/p hysterectomy in 2018.  No vaginal bleeding.   No bowel or bladder issues.    Husband is 41, just multiple myeloma.   No LMP recorded. Patient has had a hysterectomy.          Sexually active: No.  The current method of family planning is status post hysterectomy.    Exercising: Yes.     Walking and balance and flexibility  Smoker:  no  Health Maintenance: Pap   04/16/18 WNL 01/21/17 WNL  History of abnormal Pap:  no MMG:  06/02/22 density C Bi-rads 1 neg  BMD:   08/29/21 osteoporotic T score -2.7, she declined medication.  Colonoscopy: 06/16/18 TDaP:  05/07/21 Gardasil: n/a   reports that she has never smoked. She has never used smokeless tobacco. She reports that she does not drink alcohol and does not use drugs. 2 kids, no grandchildren.   Past Medical History:  Diagnosis Date   Abnormal uterine bleeding    ALLERGIC RHINITIS 07/07/2007   COLONIC POLYPS, ADENOMATOUS, HX OF 11/19/2005   CONSTIPATION 12/31/2009   DIVERTICULOSIS, COLON 07/12/2008   Endometrial cancer ([facility])    Family history of colon cancer    Hearing loss    History of radiation therapy 04/07/17-04/30/17   viaginal cuff treated to 30 Gy in 5 fractions   History of radiation therapy 04/07/17,04/14/17,04/21/17,04/30/17   endometrial- Dr. Roselind Messier    SEBORRHEIC KERATOSIS, INFLAMED 03/21/2008   SENILE VAGINITIS 01/17/2008    Past Surgical History:  Procedure Laterality Date   BREAST BIOPSY     COSMETIC SURGERY     DILATION AND CURETTAGE OF UTERUS     EYELID IMPLANT     right and left   RHINOPLASTY     ROBOTIC ASSISTED TOTAL HYSTERECTOMY WITH BILATERAL SALPINGO OOPHERECTOMY Bilateral 02/24/2017   Procedure: XI ROBOTIC ASSISTED TOTAL HYSTERECTOMY WITH BILATERAL SALPINGO OOPHORECTOMY;  Surgeon: Cleda Mccreedy, MD;  Location: [facility];  Service: Gynecology;  Laterality: Bilateral;   SENTINEL NODE  BIOPSY N/A 02/24/2017   Procedure: SENTINEL NODE BIOPSY;  Surgeon: Cleda Mccreedy, MD;  Location: [facility];  Service: Gynecology;  Laterality: N/A;   TONSILECTOMY/ADENOIDECTOMY WITH MYRINGOTOMY      Current Outpatient Medications  Medication Sig Dispense Refill   calcium-vitamin D (OSCAL WITH D) 500-200 MG-UNIT tablet Take 1 tablet by mouth daily with breakfast.     Cyanocobalamin (VITAMIN B12 PO)      EPINEPHrine 0.3 mg/0.3 mL IJ SOAJ injection   0   fluticasone (FLONASE) 50 MCG/ACT nasal spray 1-2 sprays     Multiple Vitamin (MULTIVITAMIN) capsule Take 1 capsule by mouth daily.     NON FORMULARY Allergy Injections- weekly     Probiotic Product (ALIGN) 4 MG CAPS Take 1 tablet by mouth daily.     VITAMIN D PO      No current facility-administered medications for this visit.    Family History  Problem Relation Age of Onset   Colon cancer Mother 23   Heart disease Father    Anuerysm Sister    Esophageal cancer Neg Hx    Stomach cancer Neg Hx     Review of Systems  All other systems reviewed and are negative.   Exam:   There were no vitals taken for this visit.  Weight change: @ Height:      Ht Readings from Last 3 Encounters:  09/02/22   (1.727 m)  06/25/22  (1.727 m)  03/24/22  (1.727 m)    General appearance: alert, cooperative and appears stated age Head: Normocephalic, without obvious abnormality, atraumatic Neck: no adenopathy, supple, symmetrical, trachea midline and thyroid normal to inspection and palpation Breasts: normal appearance, no masses or tenderness Abdomen: soft, non-tender; non distended,  no masses,  no organomegaly Extremities: extremities normal, atraumatic, no cyanosis or edema Skin: Skin color, texture, turgor normal. No rashes or lesions Lymph nodes: Cervical, supraclavicular, and axillary nodes normal. No abnormal inguinal nodes palpated Neurologic: Grossly normal   Pelvic: External genitalia:  no lesions               Urethra:  normal appearing urethra with no masses, tenderness or lesions              Bartholins and Skenes: normal                 Vagina: normal appearing vagina with normal color and discharge, no lesions              Cervix: absent               Bimanual Exam:  Uterus:  uterus absent              Adnexa: no mass, fullness, tenderness               Rectovaginal: Confirms               Anus:  normal sphincter tone, no lesions  Carolynn Serve, CMA chaperoned for the exam.  1. Encounter for breast and pelvic examination Discussed breast self exam Discussed calcium and vit D intake Labs with primary DEXA due with primary in the fall No pap needed Colonoscopy UTD Mammogram due in 7/24  2. History of endometrial cancer Normal exam

## 2023-02-25 NOTE — Patient Instructions (Signed)

## 2023-03-04 DIAGNOSIS — J3081 Allergic rhinitis due to animal (cat) (dog) hair and dander: Secondary | ICD-10-CM | POA: Diagnosis not present

## 2023-03-04 DIAGNOSIS — J3089 Other allergic rhinitis: Secondary | ICD-10-CM | POA: Diagnosis not present

## 2023-03-04 DIAGNOSIS — J301 Allergic rhinitis due to pollen: Secondary | ICD-10-CM | POA: Diagnosis not present

## 2023-03-09 DIAGNOSIS — J301 Allergic rhinitis due to pollen: Secondary | ICD-10-CM | POA: Diagnosis not present

## 2023-03-09 DIAGNOSIS — J3081 Allergic rhinitis due to animal (cat) (dog) hair and dander: Secondary | ICD-10-CM | POA: Diagnosis not present

## 2023-03-09 DIAGNOSIS — J3089 Other allergic rhinitis: Secondary | ICD-10-CM | POA: Diagnosis not present

## 2023-03-10 ENCOUNTER — Encounter: Payer: Self-pay | Admitting: Family Medicine

## 2023-03-30 DIAGNOSIS — J3089 Other allergic rhinitis: Secondary | ICD-10-CM | POA: Diagnosis not present

## 2023-03-30 DIAGNOSIS — J301 Allergic rhinitis due to pollen: Secondary | ICD-10-CM | POA: Diagnosis not present

## 2023-03-30 DIAGNOSIS — J3081 Allergic rhinitis due to animal (cat) (dog) hair and dander: Secondary | ICD-10-CM | POA: Diagnosis not present

## 2023-04-07 DIAGNOSIS — J3089 Other allergic rhinitis: Secondary | ICD-10-CM | POA: Diagnosis not present

## 2023-04-07 DIAGNOSIS — J301 Allergic rhinitis due to pollen: Secondary | ICD-10-CM | POA: Diagnosis not present

## 2023-04-13 DIAGNOSIS — J3081 Allergic rhinitis due to animal (cat) (dog) hair and dander: Secondary | ICD-10-CM | POA: Diagnosis not present

## 2023-04-13 DIAGNOSIS — J301 Allergic rhinitis due to pollen: Secondary | ICD-10-CM | POA: Diagnosis not present

## 2023-04-13 DIAGNOSIS — J3089 Other allergic rhinitis: Secondary | ICD-10-CM | POA: Diagnosis not present

## 2023-04-20 DIAGNOSIS — J3089 Other allergic rhinitis: Secondary | ICD-10-CM | POA: Diagnosis not present

## 2023-04-20 DIAGNOSIS — J3081 Allergic rhinitis due to animal (cat) (dog) hair and dander: Secondary | ICD-10-CM | POA: Diagnosis not present

## 2023-04-20 DIAGNOSIS — J301 Allergic rhinitis due to pollen: Secondary | ICD-10-CM | POA: Diagnosis not present

## 2023-04-27 DIAGNOSIS — J3081 Allergic rhinitis due to animal (cat) (dog) hair and dander: Secondary | ICD-10-CM | POA: Diagnosis not present

## 2023-04-27 DIAGNOSIS — J3089 Other allergic rhinitis: Secondary | ICD-10-CM | POA: Diagnosis not present

## 2023-04-27 DIAGNOSIS — J301 Allergic rhinitis due to pollen: Secondary | ICD-10-CM | POA: Diagnosis not present

## 2023-05-04 DIAGNOSIS — M1611 Unilateral primary osteoarthritis, right hip: Secondary | ICD-10-CM | POA: Diagnosis not present

## 2023-05-04 DIAGNOSIS — J3081 Allergic rhinitis due to animal (cat) (dog) hair and dander: Secondary | ICD-10-CM | POA: Diagnosis not present

## 2023-05-04 DIAGNOSIS — J301 Allergic rhinitis due to pollen: Secondary | ICD-10-CM | POA: Diagnosis not present

## 2023-05-04 DIAGNOSIS — J3089 Other allergic rhinitis: Secondary | ICD-10-CM | POA: Diagnosis not present

## 2023-05-11 DIAGNOSIS — J301 Allergic rhinitis due to pollen: Secondary | ICD-10-CM | POA: Diagnosis not present

## 2023-05-11 DIAGNOSIS — J3081 Allergic rhinitis due to animal (cat) (dog) hair and dander: Secondary | ICD-10-CM | POA: Diagnosis not present

## 2023-05-11 DIAGNOSIS — J3089 Other allergic rhinitis: Secondary | ICD-10-CM | POA: Diagnosis not present

## 2023-05-18 DIAGNOSIS — J3089 Other allergic rhinitis: Secondary | ICD-10-CM | POA: Diagnosis not present

## 2023-05-18 DIAGNOSIS — J301 Allergic rhinitis due to pollen: Secondary | ICD-10-CM | POA: Diagnosis not present

## 2023-05-18 DIAGNOSIS — J3081 Allergic rhinitis due to animal (cat) (dog) hair and dander: Secondary | ICD-10-CM | POA: Diagnosis not present

## 2023-05-20 ENCOUNTER — Ambulatory Visit (INDEPENDENT_AMBULATORY_CARE_PROVIDER_SITE_OTHER): Payer: Medicare PPO | Admitting: Emergency Medicine

## 2023-05-20 VITALS — Ht 68.0 in | Wt 136.0 lb

## 2023-05-20 DIAGNOSIS — Z0000 Encounter for general adult medical examination without abnormal findings: Secondary | ICD-10-CM

## 2023-05-20 NOTE — Progress Notes (Signed)
Subjective:   Sheila Armstrong is a 88 y.o. female who presents for Medicare Annual (Subsequent) preventive examination.  Visit Complete: Virtual  I connected with  Tilda Burrow on 05/20/23 by a audio enabled telemedicine application and verified that I am speaking with the correct person using two identifiers.  Patient Location: Home  Provider Location: Home Office  I discussed the limitations of evaluation and management by telemedicine. The patient expressed understanding and agreed to proceed.   Review of Systems     Cardiac Risk Factors include: advanced age (>59men, >52 women)     Objective:    Today's Vitals   05/20/23 1549  Weight: 136 lb (61.7 kg)  Height: 5\' 8"  (1.727 m)   Body mass index is 20.68 kg/m.     05/20/2023    4:05 PM 03/14/2022    3:52 PM 02/20/2022   11:09 AM 02/14/2021   11:16 AM 08/01/2020    9:19 AM 01/26/2020   11:48 AM 01/20/2019   10:51 AM  Advanced Directives  Does Patient Have a Medical Advance Directive? Yes Yes Yes Yes Yes Yes Yes  Type of Estate agent of [place];Living will Healthcare Power of [place];Living will Healthcare Power of [place];Living will Healthcare Power of [place];Living will Healthcare Power of [place];Living will Healthcare Power of [place];Living will Healthcare Power of [place];Living will  Does patient want to make changes to medical advance directive? No - Patient declined  No - Patient declined No - Patient declined     Copy of Healthcare Power of Attorney in Chart? No - copy requested No - copy requested No - copy requested No - copy requested No - copy requested No - copy requested No - copy requested  Would patient like information on creating a medical advance directive?  No - Patient declined         Current Medications (verified) Outpatient Encounter Medications as of 05/20/2023  Medication Sig   calcium-vitamin D (OSCAL WITH D) 500-200 MG-UNIT tablet Take 1 tablet by mouth  daily with breakfast.   EPINEPHrine 0.3 mg/0.3 mL IJ SOAJ injection    Multiple Vitamin (MULTIVITAMIN) capsule Take 1 capsule by mouth daily.   NON FORMULARY Allergy Injections- weekly   Probiotic Product (ALIGN) 4 MG CAPS Take 1 tablet by mouth daily.   VITAMIN D PO    Cyanocobalamin (VITAMIN B12 PO)  (Patient not taking: Reported on 05/20/2023)   fluticasone (FLONASE) 50 MCG/ACT nasal spray 1-2 sprays (Patient not taking: Reported on 05/20/2023)   No facility-administered encounter medications on file as of 05/20/2023.    Allergies (verified) Amoxicillin, Sulfonamide derivatives, Sulfur, Thimerosal (thiomersal), and Sudafed [pseudoephedrine]   History: Past Medical History:  Diagnosis Date   Abnormal uterine bleeding    ALLERGIC RHINITIS 07/07/2007   COLONIC POLYPS, ADENOMATOUS, HX OF 11/19/2005   CONSTIPATION 12/31/2009   DIVERTICULOSIS, COLON 07/12/2008   Endometrial cancer (HCC)    Family history of colon cancer    Hearing loss    History of radiation therapy 04/07/17-04/30/17   viaginal cuff treated to 30 Gy in 5 fractions   History of radiation therapy 04/07/17,04/14/17,04/21/17,04/30/17   endometrial- Dr. Roselind Messier    SEBORRHEIC KERATOSIS, INFLAMED 03/21/2008   SENILE VAGINITIS 01/17/2008   Past Surgical History:  Procedure Laterality Date   BREAST BIOPSY     COSMETIC SURGERY     DILATION AND CURETTAGE OF UTERUS     EYELID IMPLANT     right and left   RHINOPLASTY  ROBOTIC ASSISTED TOTAL HYSTERECTOMY WITH BILATERAL SALPINGO OOPHERECTOMY Bilateral 02/24/2017   Procedure: XI ROBOTIC ASSISTED TOTAL HYSTERECTOMY WITH BILATERAL SALPINGO OOPHORECTOMY;  Surgeon: Cleda Mccreedy, MD;  Location: [facility];  Service: Gynecology;  Laterality: Bilateral;   SENTINEL NODE BIOPSY N/A 02/24/2017   Procedure: SENTINEL NODE BIOPSY;  Surgeon: Cleda Mccreedy, MD;  Location: [facility];  Service: Gynecology;  Laterality: N/A;   TONSILECTOMY/ADENOIDECTOMY WITH MYRINGOTOMY     Family History  Problem Relation Age  of Onset   Colon cancer Mother 58   Heart disease Father    Anuerysm Sister    COPD Sister    Esophageal cancer Neg Hx    Stomach cancer Neg Hx    Breast cancer Neg Hx    Social History   Socioeconomic History   Marital status: Married    Spouse name: Casimiro Needle   Number of children: 2   Years of education: Not on file   Highest education level: Not on file  Occupational History   Occupation: Research officer, political party at [employer]    Comment: retired  Tobacco Use   Smoking status: Never   Smokeless tobacco: Never  Vaping Use   Vaping Use: Never used  Substance and Sexual Activity   Alcohol use: No   Drug use: No   Sexual activity: Not Currently    Partners: Male  Other Topics Concern   Not on file  Social History Narrative   Married for 15 years   Two children ( both live locally)    Retired - Arts administrator - Advertising account executive       She likes to travel    Social Determinants of Corporate investment banker Strain: Low Risk  (05/20/2023)   Overall Financial Resource Strain (CARDIA)    Difficulty of Paying Living Expenses: Not hard at all  Food Insecurity: No Food Insecurity (05/20/2023)   Hunger Vital Sign    Worried About Running Out of Food in the Last Year: Never true    Ran Out of Food in the Last Year: Never true  Transportation Needs: No Transportation Needs (05/20/2023)   PRAPARE - Administrator, Civil Service (Medical): No    Lack of Transportation (Non-Medical): No  Physical Activity: Sufficiently Active (05/20/2023)   Exercise Vital Sign    Days of Exercise per Week: 4 days    Minutes of Exercise per Session: 40 min  Stress: Stress Concern Present (05/20/2023)   [employer] of Occupational Health - Occupational Stress Questionnaire    Feeling of Stress : Rather much  Social Connections: Socially Integrated (05/20/2023)   Social Connection and Isolation Panel [NHANES]    Frequency of Communication with Friends and Family: More than three times a week     Frequency of Social Gatherings with Friends and Family: More than three times a week    Attends Religious Services: More than 4 times per year    Active Member of [employer] or Organizations: Yes    Attends Engineer, structural: More than 4 times per year    Marital Status: Married    Tobacco Counseling Counseling given: Not Answered   Clinical Intake:  Pre-visit preparation completed: Yes  Pain : No/denies pain     BMI - recorded: 20.68 Nutritional Status: BMI of 19-24  Normal Nutritional Risks: None Diabetes: No  How often do you need to have someone help you when you read instructions, pamphlets, or other written materials from your doctor or pharmacy?: 1 - Never  Interpreter Needed?:  No  Information entered by :: Tora Kindred, CMA   Activities of Daily Living    05/20/2023    3:52 PM  In your present state of health, do you have any difficulty performing the following activities:  Hearing? 0  Comment wears hearing aids  Vision? 0  Difficulty concentrating or making decisions? 0  Walking or climbing stairs? 0  Dressing or bathing? 0  Doing errands, shopping? 0  Preparing Food and eating ? N  Using the Toilet? N  In the past six months, have you accidently leaked urine? N  Do you have problems with loss of bowel control? N  Managing your Medications? N  Managing your Finances? N  Housekeeping or managing your Housekeeping? N    Patient Care Team: Sharlene Dory, DO as PCP - General (Family Medicine)  Indicate any recent Medical Services you may have received from other than [facility] providers in the past year (date may be approximate).     Assessment:   This is a routine wellness examination for Jailen.  Hearing/Vision screen Hearing Screening - Comments:: Wears hearing aids  Dietary issues and exercise activities discussed:     Goals Addressed               This Visit's Progress     DIET - INCREASE WATER INTAKE (pt-stated)         Patient states she would like to drink at least 6 glasses of water daily.      COMPLETED: Exercise 3x per week (30 min per time)        Continue to stay active and stay healthy.       Depression Screen    05/20/2023    4:01 PM 09/02/2022    8:46 AM 03/14/2022    3:50 PM 03/04/2022    9:20 AM 08/28/2021    8:20 AM 08/23/2020    9:50 AM 07/15/2018   11:28 AM  PHQ 2/9 Scores  PHQ - 2 Score 0 0 0 0 0 0 0  PHQ- 9 Score 0 0         Fall Risk    05/20/2023    4:06 PM 09/02/2022    8:46 AM 03/14/2022    3:52 PM 08/28/2021    8:19 AM 08/23/2020    9:50 AM  Fall Risk   Falls in the past year? 0 0 0 0 0  Number falls in past yr: 0 0 0 0 0  Injury with Fall? 0 0 0 0   Risk for fall due to : No Fall Risks No Fall Risks No Fall Risks No Fall Risks   Follow up Falls prevention discussed Falls evaluation completed Falls prevention discussed Falls evaluation completed     MEDICARE RISK AT HOME:  Medicare Risk at Home - 05/20/23 1606     Any stairs in or around the home? No    If so, are there any without handrails? No    Home free of loose throw rugs in walkways, pet beds, electrical cords, etc? Yes    Adequate lighting in your home to reduce risk of falls? Yes    Life alert? No    Use of a cane, walker or w/c? No    Grab bars in the bathroom? Yes    Shower chair or bench in shower? No    Elevated toilet seat or a handicapped toilet? Yes             TIMED UP AND GO:  Was the test performed?  No    Cognitive Function:        05/20/2023    4:08 PM 03/14/2022    3:54 PM  6CIT Screen  What Year? 0 points 0 points  What month? 0 points 0 points  What time? 0 points 0 points  Count back from 20 0 points 0 points  Months in reverse 0 points 0 points  Repeat phrase 0 points 0 points  Total Score 0 points 0 points    Immunizations Immunization History  Administered Date(s) Administered   Fluad Quad(high Dose 65+) 08/09/2019, 08/23/2020, 08/28/2021   Influenza Split  08/26/2011, 08/26/2012, 08/05/2013   Influenza Whole 11/10/2005, 09/17/2007, 08/21/2008, 08/13/2009, 08/07/2010   Influenza, High Dose Seasonal PF 09/06/2014, 09/03/2015, 07/22/2016, 07/24/2016, 08/07/2018, 01/07/2022, 08/30/2022   Influenza-Unspecified 08/17/2017, 08/11/2022   PFIZER(Purple Top)SARS-COV-2 Vaccination 11/18/2019, 12/09/2019, 07/03/2020   Pfizer Covid-19 Vaccine Bivalent Booster 24yrs & up 10/08/2021   Pneumococcal Conjugate-13 03/14/2014   Pneumococcal Polysaccharide-23 11/11/2003, 01/25/2009   Td 11/10/2000   Tdap 02/13/2011, 05/07/2021   Zoster Recombinant(Shingrix) 08/07/2018, 08/25/2019   Zoster, Live 05/17/2010    TDAP status: Up to date  Flu Vaccine status: Up to date  Pneumococcal vaccine status: Up to date  Covid-19 vaccine status: Completed vaccines  Qualifies for Shingles Vaccine? Yes   Zostavax completed Yes   Shingrix Completed?: Yes  Screening Tests Health Maintenance  Topic Date Due   COVID-19 Vaccine (5 - 2023-24 season) 07/11/2022   MAMMOGRAM  06/03/2023   INFLUENZA VACCINE  06/11/2023   DEXA SCAN  08/30/2023   Medicare Annual Wellness (AWV)  05/19/2024   DTaP/Tdap/Td (4 - Td or Tdap) 05/08/2031   Pneumonia Vaccine 39+ Years old  Completed   Zoster Vaccines- Shingrix  Completed   HPV VACCINES  Aged Out    Health Maintenance  Health Maintenance Due  Topic Date Due   COVID-19 Vaccine (5 - 2023-24 season) 07/11/2022    Colorectal cancer screening: No longer required.   Mammogram status: Completed 06/02/22. Repeat every year  Bone Density status: Completed 08/29/21. Results reflect: Bone density results: OSTEOPOROSIS. Repeat every 2 years.  Lung Cancer Screening: (Low Dose CT Chest recommended if Age 84-80 years, 20 pack-year currently smoking OR have quit w/in 15years.) does not qualify.   Lung Cancer Screening Referral: n/a  Additional Screening:  Hepatitis C Screening: does not qualify; Completed no, aged out  Vision  Screening: Recommended annual ophthalmology exams for early detection of glaucoma and other disorders of the eye. Is the patient up to date with their annual eye exam?   yes Who is the provider or what is the name of the office in which the patient attends annual eye exams? Dr. Sinda Du If pt is not established with a provider, would they like to be referred to a provider to establish care? No .   Dental Screening: Recommended annual dental exams for proper oral hygiene  Diabetic Foot Exam: n/a  Community Resource Referral / Chronic Care Management: CRR required this visit?  No   CCM required this visit?  No     Plan:     I have personally reviewed and noted the following in the patient's chart:   Medical and social history Use of alcohol, tobacco or illicit drugs  Current medications and supplements including opioid prescriptions. Patient is not currently taking opioid prescriptions. Functional ability and status Nutritional status Physical activity Advanced directives List of other physicians Hospitalizations, surgeries, and ER visits in previous 12 months  Vitals Screenings to include cognitive, depression, and falls Referrals and appointments  In addition, I have reviewed and discussed with patient certain preventive protocols, quality metrics, and best practice recommendations. A written personalized care plan for preventive services as well as general preventive health recommendations were provided to patient.     Tora Kindred, CMA   05/20/2023   After Visit Summary: (MyChart) Due to this being a telephonic visit, the after visit summary with patients personalized plan was offered to patient via MyChart   Nurse Notes: Patient is doing well and UTD on health maintenance.

## 2023-05-20 NOTE — Patient Instructions (Signed)
Sheila Armstrong , Thank you for taking time to come for your Medicare Wellness Visit. I appreciate your ongoing commitment to your health goals. Please review the following plan we discussed and let me know if I can assist you in the future.   These are the goals we discussed:  Goals       DIET - INCREASE WATER INTAKE (pt-stated)      Patient states she would like to drink at least 6 glasses of water daily.        This is a list of the screening recommended for you and due dates:  Health Maintenance  Topic Date Due   COVID-19 Vaccine (5 - 2023-24 season) 07/11/2022   Mammogram  06/03/2023   Flu Shot  06/11/2023   DEXA scan (bone density measurement)  08/30/2023   Medicare Annual Wellness Visit  05/19/2024   DTaP/Tdap/Td vaccine (4 - Td or Tdap) 05/08/2031   Pneumonia Vaccine  Completed   Zoster (Shingles) Vaccine  Completed   HPV Vaccine  Aged Out    Advanced directives: Please bring a copy of your health care power of attorney and living will to the office to be added to your chart at your convenience.   Conditions/risks identified: Keep up the good work!  Next appointment: Follow up in one year for your annual wellness visit 05/24/24   Preventive Care 65 Years and Older, Female Preventive care refers to lifestyle choices and visits with your health care provider that can promote health and wellness. What does preventive care include? A yearly physical exam. This is also called an annual well check. Dental exams once or twice a year. Routine eye exams. Ask your health care provider how often you should have your eyes checked. Personal lifestyle choices, including: Daily care of your teeth and gums. Regular physical activity. Eating a healthy diet. Avoiding tobacco and drug use. Limiting alcohol use. Practicing safe sex. Taking low-dose aspirin every day. Taking vitamin and mineral supplements as recommended by your health care provider. What happens during an annual well  check? The services and screenings done by your health care provider during your annual well check will depend on your age, overall health, lifestyle risk factors, and family history of disease. Counseling  Your health care provider may ask you questions about your: Alcohol use. Tobacco use. Drug use. Emotional well-being. Home and relationship well-being. Sexual activity. Eating habits. History of falls. Memory and ability to understand (cognition). Work and work Astronomer. Reproductive health. Screening  You may have the following tests or measurements: Height, weight, and BMI. Blood pressure. Lipid and cholesterol levels. These may be checked every 5 years, or more frequently if you are over 71 years old. Skin check. Lung cancer screening. You may have this screening every year starting at age 82 if you have a 30-pack-year history of smoking and currently smoke or have quit within the past 15 years. Fecal occult blood test (FOBT) of the stool. You may have this test every year starting at age 47. Flexible sigmoidoscopy or colonoscopy. You may have a sigmoidoscopy every 5 years or a colonoscopy every 10 years starting at age 22. Hepatitis C blood test. Hepatitis B blood test. Sexually transmitted disease (STD) testing. Diabetes screening. This is done by checking your blood sugar (glucose) after you have not eaten for a while (fasting). You may have this done every 1-3 years. Bone density scan. This is done to screen for osteoporosis. You may have this done starting at age 88.  Mammogram. This may be done every 1-2 years. Talk to your health care provider about how often you should have regular mammograms. Talk with your health care provider about your test results, treatment options, and if necessary, the need for more tests. Vaccines  Your health care provider may recommend certain vaccines, such as: Influenza vaccine. This is recommended every year. Tetanus, diphtheria, and  acellular pertussis (Tdap, Td) vaccine. You may need a Td booster every 10 years. Zoster vaccine. You may need this after age 42. Pneumococcal 13-valent conjugate (PCV13) vaccine. One dose is recommended after age 49. Pneumococcal polysaccharide (PPSV23) vaccine. One dose is recommended after age 53. Talk to your health care provider about which screenings and vaccines you need and how often you need them. This information is not intended to replace advice given to you by your health care provider. Make sure you discuss any questions you have with your health care provider. Document Released: 11/23/2015 Document Revised: 07/16/2016 Document Reviewed: 08/28/2015 Elsevier Interactive Patient Education  2017 [employer].  Fall Prevention in the Home Falls can cause injuries. They can happen to people of all ages. There are many things you can do to make your home safe and to help prevent falls. What can I do on the outside of my home? Regularly fix the edges of walkways and driveways and fix any cracks. Remove anything that might make you trip as you walk through a door, such as a raised step or threshold. Trim any bushes or trees on the path to your home. Use bright outdoor lighting. Clear any walking paths of anything that might make someone trip, such as rocks or tools. Regularly check to see if handrails are loose or broken. Make sure that both sides of any steps have handrails. Any raised decks and porches should have guardrails on the edges. Have any leaves, snow, or ice cleared regularly. Use sand or salt on walking paths during winter. Clean up any spills in your garage right away. This includes oil or grease spills. What can I do in the bathroom? Use night lights. Install grab bars by the toilet and in the tub and shower. Do not use towel bars as grab bars. Use non-skid mats or decals in the tub or shower. If you need to sit down in the shower, use a plastic, non-slip stool. Keep  the floor dry. Clean up any water that spills on the floor as soon as it happens. Remove soap buildup in the tub or shower regularly. Attach bath mats securely with double-sided non-slip rug tape. Do not have throw rugs and other things on the floor that can make you trip. What can I do in the bedroom? Use night lights. Make sure that you have a light by your bed that is easy to reach. Do not use any sheets or blankets that are too big for your bed. They should not hang down onto the floor. Have a firm chair that has side arms. You can use this for support while you get dressed. Do not have throw rugs and other things on the floor that can make you trip. What can I do in the kitchen? Clean up any spills right away. Avoid walking on wet floors. Keep items that you use a lot in easy-to-reach places. If you need to reach something above you, use a strong step stool that has a grab bar. Keep electrical cords out of the way. Do not use floor polish or wax that makes floors slippery. If  you must use wax, use non-skid floor wax. Do not have throw rugs and other things on the floor that can make you trip. What can I do with my stairs? Do not leave any items on the stairs. Make sure that there are handrails on both sides of the stairs and use them. Fix handrails that are broken or loose. Make sure that handrails are as long as the stairways. Check any carpeting to make sure that it is firmly attached to the stairs. Fix any carpet that is loose or worn. Avoid having throw rugs at the top or bottom of the stairs. If you do have throw rugs, attach them to the floor with carpet tape. Make sure that you have a light switch at the top of the stairs and the bottom of the stairs. If you do not have them, ask someone to add them for you. What else can I do to help prevent falls? Wear shoes that: Do not have high heels. Have rubber bottoms. Are comfortable and fit you well. Are closed at the toe. Do not  wear sandals. If you use a stepladder: Make sure that it is fully opened. Do not climb a closed stepladder. Make sure that both sides of the stepladder are locked into place. Ask someone to hold it for you, if possible. Clearly mark and make sure that you can see: Any grab bars or handrails. First and last steps. Where the edge of each step is. Use tools that help you move around (mobility aids) if they are needed. These include: Canes. Walkers. Scooters. Crutches. Turn on the lights when you go into a dark area. Replace any light bulbs as soon as they burn out. Set up your furniture so you have a clear path. Avoid moving your furniture around. If any of your floors are uneven, fix them. If there are any pets around you, be aware of where they are. Review your medicines with your doctor. Some medicines can make you feel dizzy. This can increase your chance of falling. Ask your doctor what other things that you can do to help prevent falls. This information is not intended to replace advice given to you by your health care provider. Make sure you discuss any questions you have with your health care provider. Document Released: 08/23/2009 Document Revised: 04/03/2016 Document Reviewed: 12/01/2014 Elsevier Interactive Patient Education  2017 [employer].

## 2023-06-01 DIAGNOSIS — J3081 Allergic rhinitis due to animal (cat) (dog) hair and dander: Secondary | ICD-10-CM | POA: Diagnosis not present

## 2023-06-01 DIAGNOSIS — J301 Allergic rhinitis due to pollen: Secondary | ICD-10-CM | POA: Diagnosis not present

## 2023-06-01 DIAGNOSIS — J3089 Other allergic rhinitis: Secondary | ICD-10-CM | POA: Diagnosis not present

## 2023-06-08 DIAGNOSIS — J3089 Other allergic rhinitis: Secondary | ICD-10-CM | POA: Diagnosis not present

## 2023-06-08 DIAGNOSIS — J3081 Allergic rhinitis due to animal (cat) (dog) hair and dander: Secondary | ICD-10-CM | POA: Diagnosis not present

## 2023-06-08 DIAGNOSIS — J301 Allergic rhinitis due to pollen: Secondary | ICD-10-CM | POA: Diagnosis not present

## 2023-06-15 DIAGNOSIS — J301 Allergic rhinitis due to pollen: Secondary | ICD-10-CM | POA: Diagnosis not present

## 2023-06-15 DIAGNOSIS — J3081 Allergic rhinitis due to animal (cat) (dog) hair and dander: Secondary | ICD-10-CM | POA: Diagnosis not present

## 2023-06-15 DIAGNOSIS — J3089 Other allergic rhinitis: Secondary | ICD-10-CM | POA: Diagnosis not present

## 2023-06-22 DIAGNOSIS — J3089 Other allergic rhinitis: Secondary | ICD-10-CM | POA: Diagnosis not present

## 2023-06-22 DIAGNOSIS — J301 Allergic rhinitis due to pollen: Secondary | ICD-10-CM | POA: Diagnosis not present

## 2023-06-22 DIAGNOSIS — J3081 Allergic rhinitis due to animal (cat) (dog) hair and dander: Secondary | ICD-10-CM | POA: Diagnosis not present

## 2023-06-29 DIAGNOSIS — J3081 Allergic rhinitis due to animal (cat) (dog) hair and dander: Secondary | ICD-10-CM | POA: Diagnosis not present

## 2023-06-29 DIAGNOSIS — J3089 Other allergic rhinitis: Secondary | ICD-10-CM | POA: Diagnosis not present

## 2023-06-29 DIAGNOSIS — J301 Allergic rhinitis due to pollen: Secondary | ICD-10-CM | POA: Diagnosis not present

## 2023-07-03 ENCOUNTER — Encounter: Payer: Self-pay | Admitting: Obstetrics and Gynecology

## 2023-07-06 DIAGNOSIS — J301 Allergic rhinitis due to pollen: Secondary | ICD-10-CM | POA: Diagnosis not present

## 2023-07-06 DIAGNOSIS — J3089 Other allergic rhinitis: Secondary | ICD-10-CM | POA: Diagnosis not present

## 2023-07-06 DIAGNOSIS — J3081 Allergic rhinitis due to animal (cat) (dog) hair and dander: Secondary | ICD-10-CM | POA: Diagnosis not present

## 2023-07-14 DIAGNOSIS — J3089 Other allergic rhinitis: Secondary | ICD-10-CM | POA: Diagnosis not present

## 2023-07-14 DIAGNOSIS — J3081 Allergic rhinitis due to animal (cat) (dog) hair and dander: Secondary | ICD-10-CM | POA: Diagnosis not present

## 2023-07-14 DIAGNOSIS — J301 Allergic rhinitis due to pollen: Secondary | ICD-10-CM | POA: Diagnosis not present

## 2023-07-20 DIAGNOSIS — J3089 Other allergic rhinitis: Secondary | ICD-10-CM | POA: Diagnosis not present

## 2023-07-20 DIAGNOSIS — J3081 Allergic rhinitis due to animal (cat) (dog) hair and dander: Secondary | ICD-10-CM | POA: Diagnosis not present

## 2023-07-20 DIAGNOSIS — J301 Allergic rhinitis due to pollen: Secondary | ICD-10-CM | POA: Diagnosis not present

## 2023-07-27 DIAGNOSIS — J301 Allergic rhinitis due to pollen: Secondary | ICD-10-CM | POA: Diagnosis not present

## 2023-07-27 DIAGNOSIS — J3081 Allergic rhinitis due to animal (cat) (dog) hair and dander: Secondary | ICD-10-CM | POA: Diagnosis not present

## 2023-07-27 DIAGNOSIS — J3089 Other allergic rhinitis: Secondary | ICD-10-CM | POA: Diagnosis not present

## 2023-08-03 DIAGNOSIS — J301 Allergic rhinitis due to pollen: Secondary | ICD-10-CM | POA: Diagnosis not present

## 2023-08-03 DIAGNOSIS — J3081 Allergic rhinitis due to animal (cat) (dog) hair and dander: Secondary | ICD-10-CM | POA: Diagnosis not present

## 2023-08-03 DIAGNOSIS — J3089 Other allergic rhinitis: Secondary | ICD-10-CM | POA: Diagnosis not present

## 2023-08-03 DIAGNOSIS — H6123 Impacted cerumen, bilateral: Secondary | ICD-10-CM | POA: Diagnosis not present

## 2023-08-10 DIAGNOSIS — J301 Allergic rhinitis due to pollen: Secondary | ICD-10-CM | POA: Diagnosis not present

## 2023-08-10 DIAGNOSIS — J3089 Other allergic rhinitis: Secondary | ICD-10-CM | POA: Diagnosis not present

## 2023-08-10 DIAGNOSIS — J3081 Allergic rhinitis due to animal (cat) (dog) hair and dander: Secondary | ICD-10-CM | POA: Diagnosis not present

## 2023-08-12 ENCOUNTER — Ambulatory Visit: Payer: Medicare PPO | Admitting: Gastroenterology

## 2023-08-12 ENCOUNTER — Other Ambulatory Visit (INDEPENDENT_AMBULATORY_CARE_PROVIDER_SITE_OTHER): Payer: Medicare PPO

## 2023-08-12 ENCOUNTER — Encounter: Payer: Self-pay | Admitting: Gastroenterology

## 2023-08-12 VITALS — BP 138/70 | HR 92 | Wt 134.0 lb

## 2023-08-12 DIAGNOSIS — Z862 Personal history of diseases of the blood and blood-forming organs and certain disorders involving the immune mechanism: Secondary | ICD-10-CM

## 2023-08-12 DIAGNOSIS — K5900 Constipation, unspecified: Secondary | ICD-10-CM | POA: Diagnosis not present

## 2023-08-12 LAB — IBC + FERRITIN
Ferritin: 116.2 ng/mL (ref 10.0–291.0)
Iron: 92 ug/dL (ref 42–145)
Saturation Ratios: 28.3 % (ref 20.0–50.0)
TIBC: 324.8 ug/dL (ref 250.0–450.0)
Transferrin: 232 mg/dL (ref 212.0–360.0)

## 2023-08-12 LAB — COMPREHENSIVE METABOLIC PANEL
ALT: 9 U/L (ref 0–35)
AST: 19 U/L (ref 0–37)
Albumin: 4.2 g/dL (ref 3.5–5.2)
Alkaline Phosphatase: 49 U/L (ref 39–117)
BUN: 23 mg/dL (ref 6–23)
CO2: 27 meq/L (ref 19–32)
Calcium: 9.7 mg/dL (ref 8.4–10.5)
Chloride: 104 meq/L (ref 96–112)
Creatinine, Ser: 1.02 mg/dL (ref 0.40–1.20)
GFR: 50.02 mL/min — ABNORMAL LOW (ref >60.00)
Glucose, Bld: 103 mg/dL — ABNORMAL HIGH (ref 70–99)
Potassium: 4.3 meq/L (ref 3.5–5.1)
Sodium: 139 meq/L (ref 135–145)
Total Bilirubin: 0.4 mg/dL (ref 0.2–1.2)
Total Protein: 7.5 g/dL (ref 6.0–8.3)

## 2023-08-12 LAB — CBC WITH DIFFERENTIAL/PLATELET
Basophils Absolute: 0 10*3/uL (ref 0.0–0.1)
Basophils Relative: 0.7 % (ref 0.0–3.0)
Eosinophils Absolute: 0.1 10*3/uL (ref 0.0–0.7)
Eosinophils Relative: 1.9 % (ref 0.0–5.0)
HCT: 32.5 % — ABNORMAL LOW (ref 36.0–46.0)
Hemoglobin: 10.9 g/dL — ABNORMAL LOW (ref 12.0–15.0)
Lymphocytes Relative: 18.6 % (ref 12.0–46.0)
Lymphs Abs: 1 10*3/uL (ref 0.7–4.0)
MCHC: 33.6 g/dL (ref 30.0–36.0)
MCV: 94.5 fL (ref 78.0–100.0)
Monocytes Absolute: 0.5 10*3/uL (ref 0.1–1.0)
Monocytes Relative: 8.5 % (ref 3.0–12.0)
Neutro Abs: 3.8 10*3/uL (ref 1.4–7.7)
Neutrophils Relative %: 70.3 % (ref 43.0–77.0)
Platelets: 226 10*3/uL (ref 150.0–400.0)
RBC: 3.44 Mil/uL — ABNORMAL LOW (ref 3.87–5.11)
RDW: 13.3 % (ref 11.5–15.5)
WBC: 5.4 10*3/uL (ref 4.0–10.5)

## 2023-08-12 LAB — VITAMIN B12: Vitamin B-12: 436 pg/mL (ref 211–911)

## 2023-08-12 LAB — FOLATE: Folate: 23.2 ng/mL (ref >5.9)

## 2023-08-12 NOTE — Progress Notes (Deleted)
[facility] Consult Note:  History: Sheila Armstrong 08/12/2023  Referring provider: Sharlene Dory, DO  Reason for consult/chief complaint: Constipation (Having some issues with occ constipation - patients states she thinks its due to too much fiber and too little water. Patient states she's willingly to have colonoscopy but wants other options first. No bleeding. )   Subjective  HPI:  ***  Screening colonoscopy Sheila Armstrong) Aug 2019 for Fam Hx CRC in mother.  Complete exam to cecum, excellent prep, marked left sided diverticulosis and o/w normal exam. ROS:  Review of Systems   Past Medical History: Past Medical History:  Diagnosis Date   Abnormal uterine bleeding    ALLERGIC RHINITIS 07/07/2007   COLONIC POLYPS, ADENOMATOUS, HX OF 11/19/2005   CONSTIPATION 12/31/2009   DIVERTICULOSIS, COLON 07/12/2008   Endometrial cancer (HCC)    Family history of colon cancer    Hearing loss    History of radiation therapy 04/07/17-04/30/17   viaginal cuff treated to 30 Gy in 5 fractions   History of radiation therapy 04/07/17,04/14/17,04/21/17,04/30/17   endometrial- Dr. Roselind Armstrong    SEBORRHEIC KERATOSIS, INFLAMED 03/21/2008   SENILE VAGINITIS 01/17/2008     Past Surgical History: Past Surgical History:  Procedure Laterality Date   BREAST BIOPSY     COSMETIC SURGERY     DILATION AND CURETTAGE OF UTERUS     EYELID IMPLANT     right and left   RHINOPLASTY     ROBOTIC ASSISTED TOTAL HYSTERECTOMY WITH BILATERAL SALPINGO OOPHERECTOMY Bilateral 02/24/2017   Procedure: XI ROBOTIC ASSISTED TOTAL HYSTERECTOMY WITH BILATERAL SALPINGO OOPHORECTOMY;  Surgeon: Cleda Mccreedy, MD;  Location: [facility];  Service: Gynecology;  Laterality: Bilateral;   SENTINEL NODE BIOPSY N/A 02/24/2017   Procedure: SENTINEL NODE BIOPSY;  Surgeon: Cleda Mccreedy, MD;  Location: [facility];  Service: Gynecology;  Laterality: N/A;   TONSILECTOMY/ADENOIDECTOMY WITH MYRINGOTOMY       Family  History: Family History  Problem Relation Age of Onset   Colon cancer Mother 109   Heart disease Father    Anuerysm Sister    COPD Sister    Esophageal cancer Neg Hx    Stomach cancer Neg Hx    Breast cancer Neg Hx     Social History: Social History   Socioeconomic History   Marital status: Married    Spouse name: Casimiro Needle   Number of children: 2   Years of education: Not on file   Highest education level: Not on file  Occupational History   Occupation: Research officer, political party at [employer]    Comment: retired  Tobacco Use   Smoking status: Never   Smokeless tobacco: Never  Vaping Use   Vaping status: Never Used  Substance and Sexual Activity   Alcohol use: No   Drug use: No   Sexual activity: Not Currently    Partners: Male  Other Topics Concern   Not on file  Social History Narrative   Married for 15 years   Two children ( both live locally)    Retired - Arts administrator - Advertising account executive       She likes to travel    Social Determinants of Corporate investment banker Strain: Low Risk  (05/20/2023)   Overall Financial Resource Strain (CARDIA)    Difficulty of Paying Living Expenses: Not hard at all  Food Insecurity: No Food Insecurity (05/20/2023)   Hunger Vital Sign    Worried About Running Out of Food in the Last Year: Never true  Ran Out of Food in the Last Year: Never true  Transportation Needs: No Transportation Needs (05/20/2023)   PRAPARE - Administrator, Civil Service (Medical): No    Lack of Transportation (Non-Medical): No  Physical Activity: Sufficiently Active (05/20/2023)   Exercise Vital Sign    Days of Exercise per Week: 4 days    Minutes of Exercise per Session: 40 min  Stress: Stress Concern Present (05/20/2023)   [employer] of Occupational Health - Occupational Stress Questionnaire    Feeling of Stress : Rather much  Social Connections: Socially Integrated (05/20/2023)   Social Connection and Isolation Panel [NHANES]    Frequency of  Communication with Friends and Family: More than three times a week    Frequency of Social Gatherings with Friends and Family: More than three times a week    Attends Religious Services: More than 4 times per year    Active Member of [employer] or Organizations: Yes    Attends Engineer, structural: More than 4 times per year    Marital Status: Married    Allergies: Allergies  Allergen Reactions   Amoxicillin Diarrhea   Sulfonamide Derivatives Hives   Sulfur Hives   Thimerosal (Thiomersal)     Swelling of eyelids from eye drops   Sudafed [Pseudoephedrine] Palpitations    Outpatient Meds: Current Outpatient Medications  Medication Sig Dispense Refill   calcium-vitamin D (OSCAL WITH D) 500-200 MG-UNIT tablet Take 1 tablet by mouth daily with breakfast.     Cyanocobalamin (VITAMIN B12 PO)      EPINEPHrine 0.3 mg/0.3 mL IJ SOAJ injection   0   fluticasone (FLONASE) 50 MCG/ACT nasal spray      Multiple Vitamin (MULTIVITAMIN) capsule Take 1 capsule by mouth daily.     NON FORMULARY Allergy Injections- weekly     Probiotic Product (ALIGN) 4 MG CAPS Take 1 tablet by mouth daily.     VITAMIN D PO      No current facility-administered medications for this visit.      ___________________________________________________________________ Objective   Exam:  BP 138/70   Pulse 92   Wt 134 lb (60.8 kg)   BMI 20.37 kg/m  Wt Readings from Last 3 Encounters:  08/12/23 134 lb (60.8 kg)  05/20/23 136 lb (61.7 kg)  02/25/23 136 lb (61.7 kg)    General: ***  Eyes: sclera anicteric, no redness ENT: oral mucosa moist without lesions, no cervical or supraclavicular lymphadenopathy CV: ***, no JVD, no peripheral edema Resp: clear to auscultation bilaterally, normal RR and effort noted GI: soft, *** tenderness, with active bowel sounds. No guarding or palpable organomegaly noted. Skin; warm and dry, no rash or jaundice noted Neuro: awake, alert and oriented x 3. Normal gross motor  function and fluent speech  Labs:     Latest Ref Rng & Units 06/25/2022    2:25 PM 08/23/2020   10:24 AM 08/18/2019    8:16 AM  CBC  WBC 4.0 - 10.5 K/uL 6.8  6.3  4.5   Hemoglobin 12.0 - 15.0 g/dL 16.1  09.6  04.5   Hematocrit 36.0 - 46.0 % 32.6  36.1  33.9   Platelets 150.0 - 400.0 K/uL 234.0  239  255.0       Latest Ref Rng & Units 09/02/2022    9:07 AM 06/25/2022    2:25 PM 08/28/2021    8:48 AM  CMP  Glucose 70 - 99 mg/dL 80  81  80   BUN 6 -  23 mg/dL 19  21  20    Creatinine 0.40 - 1.20 mg/dL 1.61  0.96  0.45   Sodium 135 - 145 mEq/L 137  135  137   Potassium 3.5 - 5.1 mEq/L 4.1  4.2  4.4   Chloride 96 - 112 mEq/L 102  99  101   CO2 19 - 32 mEq/L 28  27  30    Calcium 8.4 - 10.5 mg/dL 9.4  9.4  9.7   Total Protein 6.0 - 8.3 g/dL 6.9  7.0  7.1   Total Bilirubin 0.2 - 1.2 mg/dL 0.5  0.3  0.5   Alkaline Phos 39 - 117 U/L 45  49  53   AST 0 - 37 U/L 19  20  19    ALT 0 - 35 U/L 10  11  10       Radiologic Studies:  ***  Assessment: No diagnosis found.  ***  Plan:  ***  Thank you for the courtesy of this consult.  Please call me with any questions or concerns.  Charlie Pitter III  CC: Referring provider noted above

## 2023-08-12 NOTE — Progress Notes (Signed)
[facility] Consult Note:  History: Sheila Armstrong 08/12/2023  Referring provider: Sharlene Dory, DO  Reason for consult/chief complaint: Constipation (Having some issues with occ constipation - patients states she thinks its due to too much fiber and too little water. Patient states she's willingly to have colonoscopy but wants other options first. No bleeding. )   Subjective  HPI: Screening colonoscopy Sheila Armstrong) Aug 2019 for Fam Hx CRC in mother.  Complete exam to cecum, excellent prep, marked left sided diverticulosis and o/w normal exam.  Today, she complains of constipation that has persisted to for the past 3-4 weeks. She states that her meal and sleep schedules have been altered due to her husband's medical condition, and that she has frequently been traveling back and forth to [place] where he is being cared for by his daughters.Sheila Armstrong states that she had BM once daily for the past 3 days but other than that, she is currently having a BM every 2-4 days. Previously, she was having normal BM almost daily. As a result, she is also experiencing accompanying bloating and abdominal discomfort. She denies any bleeding or blood in stool. She reports taking a magnesium supplements several years ago and experiencing diarrhea.  She was previously taking metamucil (many years ago under the care of Dr. Katheren Puller) but had stopped it due to experiencing discomfort.  She has been attempting to increase her fiber intake through through fruits and vegetables. She also report having appetite changes and thinks she may have lost a few pounds in recent months.  Patient denies any diarrhea, nausea, black stool, vomiting, reflux, or dysphagia.  ROS:  Review of Systems  Constitutional:  Positive for appetite change and unexpected weight change. Negative for fever.  HENT:  Negative for trouble swallowing.   Respiratory:  Negative for cough and shortness of breath.    Cardiovascular:  Negative for chest pain.  Gastrointestinal:  Positive for abdominal distention (bloating) and constipation. Negative for abdominal pain, anal bleeding, blood in stool, diarrhea, nausea, rectal pain and vomiting.  Genitourinary:  Negative for dysuria.  Musculoskeletal:  Negative for back pain.  Skin:  Negative for rash.  Neurological:  Negative for weakness.  All other systems reviewed and are negative.    Past Medical History: Past Medical History:  Diagnosis Date   Abnormal uterine bleeding    ALLERGIC RHINITIS 07/07/2007   COLONIC POLYPS, ADENOMATOUS, HX OF 11/19/2005   CONSTIPATION 12/31/2009   DIVERTICULOSIS, COLON 07/12/2008   Endometrial cancer (HCC)    Family history of colon cancer    Hearing loss    History of radiation therapy 04/07/17-04/30/17   viaginal cuff treated to 30 Gy in 5 fractions   History of radiation therapy 04/07/17,04/14/17,04/21/17,04/30/17   endometrial- Dr. Roselind Messier    SEBORRHEIC KERATOSIS, INFLAMED 03/21/2008   SENILE VAGINITIS 01/17/2008     Past Surgical History: Past Surgical History:  Procedure Laterality Date   BREAST BIOPSY     COSMETIC SURGERY     DILATION AND CURETTAGE OF UTERUS     EYELID IMPLANT     right and left   RHINOPLASTY     ROBOTIC ASSISTED TOTAL HYSTERECTOMY WITH BILATERAL SALPINGO OOPHERECTOMY Bilateral 02/24/2017   Procedure: XI ROBOTIC ASSISTED TOTAL HYSTERECTOMY WITH BILATERAL SALPINGO OOPHORECTOMY;  Surgeon: Cleda Mccreedy, MD;  Location: [facility];  Service: Gynecology;  Laterality: Bilateral;   SENTINEL NODE BIOPSY N/A 02/24/2017   Procedure: SENTINEL NODE BIOPSY;  Surgeon: Cleda Mccreedy, MD;  Location: [facility];  Service:  Gynecology;  Laterality: N/A;   TONSILECTOMY/ADENOIDECTOMY WITH MYRINGOTOMY       Family History: Family History  Problem Relation Age of Onset   Colon cancer Mother 26   Heart disease Father    Sheila Armstrong Sister    COPD Sister    Esophageal cancer Neg Hx    Stomach cancer Neg Hx    Breast  cancer Neg Hx     Social History: Social History   Socioeconomic History   Marital status: Married    Spouse name: Casimiro Needle   Number of children: 2   Years of education: Not on file   Highest education level: Not on file  Occupational History   Occupation: Research officer, political party at [employer]    Comment: retired  Tobacco Use   Smoking status: Never   Smokeless tobacco: Never  Vaping Use   Vaping status: Never Used  Substance and Sexual Activity   Alcohol use: No   Drug use: No   Sexual activity: Not Currently    Partners: Male  Other Topics Concern   Not on file  Social History Narrative   Married for 15 years   Two children ( both live locally)    Retired - Arts administrator - Advertising account executive       She likes to travel    Social Determinants of Corporate investment banker Strain: Low Risk  (05/20/2023)   Overall Financial Resource Strain (CARDIA)    Difficulty of Paying Living Expenses: Not hard at all  Food Insecurity: No Food Insecurity (05/20/2023)   Hunger Vital Sign    Worried About Running Out of Food in the Last Year: Never true    Ran Out of Food in the Last Year: Never true  Transportation Needs: No Transportation Needs (05/20/2023)   PRAPARE - Administrator, Civil Service (Medical): No    Lack of Transportation (Non-Medical): No  Physical Activity: Sufficiently Active (05/20/2023)   Exercise Vital Sign    Days of Exercise per Week: 4 days    Minutes of Exercise per Session: 40 min  Stress: Stress Concern Present (05/20/2023)   [employer] of Occupational Health - Occupational Stress Questionnaire    Feeling of Stress : Rather much  Social Connections: Socially Integrated (05/20/2023)   Social Connection and Isolation Panel [NHANES]    Frequency of Communication with Friends and Family: More than three times a week    Frequency of Social Gatherings with Friends and Family: More than three times a week    Attends Religious Services: More than 4 times per  year    Active Member of [employer] or Organizations: Yes    Attends Engineer, structural: More than 4 times per year    Marital Status: Married    Allergies: Allergies  Allergen Reactions   Amoxicillin Diarrhea   Sulfonamide Derivatives Hives   Sulfur Hives   Thimerosal (Thiomersal)     Swelling of eyelids from eye drops   Sudafed [Pseudoephedrine] Palpitations    Outpatient Meds: Current Outpatient Medications  Medication Sig Dispense Refill   calcium-vitamin D (OSCAL WITH D) 500-200 MG-UNIT tablet Take 1 tablet by mouth daily with breakfast.     Cyanocobalamin (VITAMIN B12 PO)      EPINEPHrine 0.3 mg/0.3 mL IJ SOAJ injection   0   fluticasone (FLONASE) 50 MCG/ACT nasal spray      Multiple Vitamin (MULTIVITAMIN) capsule Take 1 capsule by mouth daily.     NON FORMULARY Allergy Injections- weekly  Probiotic Product (ALIGN) 4 MG CAPS Take 1 tablet by mouth daily.     VITAMIN D PO      No current facility-administered medications for this visit.      ___________________________________________________________________ Objective   Exam:  BP 138/70   Pulse 92   Wt 134 lb (60.8 kg)   BMI 20.37 kg/m  Wt Readings from Last 3 Encounters:  08/12/23 134 lb (60.8 kg)  05/20/23 136 lb (61.7 kg)  02/25/23 136 lb (61.7 kg)   General: well-appearing   Eyes: sclera anicteric, no redness ENT: oral mucosa moist without lesions, no cervical or supraclavicular lymphadenopathy CV: RRR, no JVD, no peripheral edema Resp: clear to auscultation bilaterally, normal RR and effort noted GI: soft, no tenderness, with active bowel sounds. No guarding or palpable organomegaly noted. Skin; warm and dry, no rash or jaundice noted Neuro: awake, alert and oriented x 3. Normal gross motor function and fluent speech   Labs:     Latest Ref Rng & Units 06/25/2022    2:25 PM 08/23/2020   10:24 AM 08/18/2019    8:16 AM  CBC  WBC 4.0 - 10.5 K/uL 6.8  6.3  4.5   Hemoglobin 12.0 - 15.0  g/dL 78.4  69.6  29.5   Hematocrit 36.0 - 46.0 % 32.6  36.1  33.9   Platelets 150.0 - 400.0 K/uL 234.0  239  255.0       Latest Ref Rng & Units 09/02/2022    9:07 AM 06/25/2022    2:25 PM 08/28/2021    8:48 AM  CMP  Glucose 70 - 99 mg/dL 80  81  80   BUN 6 - 23 mg/dL 19  21  20    Creatinine 0.40 - 1.20 mg/dL 2.84  1.32  4.40   Sodium 135 - 145 mEq/L 137  135  137   Potassium 3.5 - 5.1 mEq/L 4.1  4.2  4.4   Chloride 96 - 112 mEq/L 102  99  101   CO2 19 - 32 mEq/L 28  27  30    Calcium 8.4 - 10.5 mg/dL 9.4  9.4  9.7   Total Protein 6.0 - 8.3 g/dL 6.9  7.0  7.1   Total Bilirubin 0.2 - 1.2 mg/dL 0.5  0.3  0.5   Alkaline Phos 39 - 117 U/L 45  49  53   AST 0 - 37 U/L 19  20  19    ALT 0 - 35 U/L 10  11  10       Radiologic Studies:   Assessment: Acute constipation - Plan: Vitamin B12, Folate, CBC with Differential/Platelet, Comprehensive metabolic panel, IBC + Ferritin  History of anemia - Plan: Vitamin B12, Folate, CBC with Differential/Platelet, Comprehensive metabolic panel, IBC + Ferritin   Recent constipation that seems likely situational due to changes in eating and sleep schedule with frequent travel due to her husband's illness.  No rectal bleeding or loss of appetite.  There may be some modest recent weight loss of unclear correlation to this, but she also says her appetite is diminished somewhat. I reassured her I think this is not likely to be of a harmful cause, and that with some minor changes we can probably help get her bowel habits regulated.  No symptoms or exam signs to suggest obstruction. No current plans for imaging or colonoscopy. Plan:  -CBC, CMET, B12,  IBCF today -Advised to increase water intake -advised to get several servings of fruits and vegetables daily but not to  take an additional fiber supplement since it often makes patient's bloated as she previously experienced. -Advised to start a magnesium 250 mg supplement at bedtime.  However, if she finds  that makes the stool too loose, she can also take it only when needed if she is experiencing constipation such as no BM for 2 to 3 days. - If constipation persist despite that, start 1/3 capful of Miralax in a glass of water at bedtime.  I asked her to keep in touch with [place] in a couple of weeks with an update.  She feels comfortable with that plan and is able to use MyChart messaging.  If she is still having difficulty, neck step would most likely be CT scan abdomen and pelvis rather than colonoscopy.  Thank you for the courtesy of this consult.  Please call me with any questions or concerns.  I,Safa M Kadhim,acting as a scribe for Charlie Pitter III, MD.,have documented all relevant documentation on the behalf of Sherrilyn Rist, MD,as directed by  Sherrilyn Rist, MD while in the presence of Sherrilyn Rist, MD.   Marvis Repress III, MD, have reviewed all documentation for this visit. The documentation on 08/12/23 for the exam, diagnosis, procedures, and orders are all accurate and complete.    CC: Referring provider noted above

## 2023-08-12 NOTE — Patient Instructions (Signed)
_______________________________________________________  If your blood pressure at your visit was 140/90 or greater, please contact your primary care physician to follow up on this.  _______________________________________________________  If you are age 86 or older, your body mass index should be between 23-30. Your Body mass index is 20.37 kg/m. If this is out of the aforementioned range listed, please consider follow up with your Primary Care Provider.  If you are age 18 or younger, your body mass index should be between 19-25. Your Body mass index is 20.37 kg/m. If this is out of the aformentioned range listed, please consider follow up with your Primary Care Provider.   ________________________________________________________  The [facility] providers would like to encourage you to use [facility] to communicate with providers for non-urgent requests or questions.  Due to long hold times on the telephone, sending your provider a message by [facility] may be a faster and more efficient way to get a response.  Please allow 48 business hours for a response.  Please remember that this is for non-urgent requests.  _______________________________________________________  Your provider has requested that you go to the basement level for lab work before leaving today. Press "B" on the elevator. The lab is located at the first door on the left as you exit the elevator.  Due to recent changes in healthcare laws, you may see the results of your imaging and laboratory studies on MyChart before your provider has had a chance to review them.  We understand that in some cases there may be results that are confusing or concerning to you. Not all laboratory results come back in the same time frame and the provider may be waiting for multiple results in order to interpret others.  Please give [place] 48 hours in order for your provider to thoroughly review all the results before contacting the office for clarification of  your results.   It was a pleasure to see you today!  Thank you for trusting me with your gastrointestinal care!

## 2023-08-17 DIAGNOSIS — J3081 Allergic rhinitis due to animal (cat) (dog) hair and dander: Secondary | ICD-10-CM | POA: Diagnosis not present

## 2023-08-17 DIAGNOSIS — J3089 Other allergic rhinitis: Secondary | ICD-10-CM | POA: Diagnosis not present

## 2023-08-17 DIAGNOSIS — J301 Allergic rhinitis due to pollen: Secondary | ICD-10-CM | POA: Diagnosis not present

## 2023-08-20 DIAGNOSIS — J301 Allergic rhinitis due to pollen: Secondary | ICD-10-CM | POA: Diagnosis not present

## 2023-08-20 DIAGNOSIS — J3089 Other allergic rhinitis: Secondary | ICD-10-CM | POA: Diagnosis not present

## 2023-08-24 DIAGNOSIS — J3081 Allergic rhinitis due to animal (cat) (dog) hair and dander: Secondary | ICD-10-CM | POA: Diagnosis not present

## 2023-08-24 DIAGNOSIS — J301 Allergic rhinitis due to pollen: Secondary | ICD-10-CM | POA: Diagnosis not present

## 2023-08-24 DIAGNOSIS — J3089 Other allergic rhinitis: Secondary | ICD-10-CM | POA: Diagnosis not present

## 2023-08-27 ENCOUNTER — Encounter: Payer: Self-pay | Admitting: Gastroenterology

## 2023-08-31 DIAGNOSIS — J301 Allergic rhinitis due to pollen: Secondary | ICD-10-CM | POA: Diagnosis not present

## 2023-08-31 DIAGNOSIS — J3081 Allergic rhinitis due to animal (cat) (dog) hair and dander: Secondary | ICD-10-CM | POA: Diagnosis not present

## 2023-08-31 DIAGNOSIS — J3089 Other allergic rhinitis: Secondary | ICD-10-CM | POA: Diagnosis not present

## 2023-09-01 NOTE — Telephone Encounter (Signed)
While I do not know any dietitians personally, I believe [facility] does have dietitian consultations available, and a referral can be sent if necessary.  If she has not had relief of abdominal discomfort or constipation despite the recommendations at the office visit a few weeks ago, then (per my plan in that note), I feel she should be scheduled for a CT scan abdomen and pelvis with oral and IV contrast. Indication for the scan is generalized abdominal pain  Ellwood Dense, MD

## 2023-09-02 NOTE — Telephone Encounter (Signed)
Pt made aware of Dr. [name] recommendations: Pt stated that she is feeling great and that she does not feel that she needs the CT scan or to see a dietitian. Pt stated that she is no longer having abdominal pain. Pt verbalized understanding with all questions answered.

## 2023-09-03 ENCOUNTER — Other Ambulatory Visit: Payer: Self-pay

## 2023-09-03 ENCOUNTER — Telehealth: Payer: Self-pay | Admitting: Gastroenterology

## 2023-09-03 DIAGNOSIS — R1084 Generalized abdominal pain: Secondary | ICD-10-CM

## 2023-09-03 NOTE — Telephone Encounter (Signed)
Spoke with patient & scheduled CT for 09/09/23 at 4:00 pm at [facility]. Pt will need to arrive by 2:00 pm to drink the oral contrast. Radiology number provided for patient in case she needs to reschedule. Pt verbalized all understanding, no further questions.

## 2023-09-03 NOTE — Telephone Encounter (Signed)
Patient called stated she decided to proceed with getting a CT scan scheduled.

## 2023-09-07 ENCOUNTER — Telehealth: Payer: Self-pay | Admitting: Gastroenterology

## 2023-09-07 DIAGNOSIS — J3089 Other allergic rhinitis: Secondary | ICD-10-CM | POA: Diagnosis not present

## 2023-09-07 DIAGNOSIS — J3081 Allergic rhinitis due to animal (cat) (dog) hair and dander: Secondary | ICD-10-CM | POA: Diagnosis not present

## 2023-09-07 DIAGNOSIS — J301 Allergic rhinitis due to pollen: Secondary | ICD-10-CM | POA: Diagnosis not present

## 2023-09-07 NOTE — Telephone Encounter (Signed)
McPeak, April D  Kalinda Romaniello, RN Yes correct -- CT ABDOMEN PELVIS W CONTRAST at [facility],.       Previous Messages    ----- Message ----- From: Emeline Darling, RN Sent: 09/07/2023   8:51 AM EDT To: April D McPeak Subject: RE: peer to peer                              Good Morning Ms April, This message that you sent prior I want to confirm that this is for the CT scan that Dr. [name] ordered. Is this correct? Viviann Spare RN

## 2023-09-07 NOTE — Telephone Encounter (Addendum)
Sheila Armstrong,  I received this message at this morning from one of our staff.  I do not know for sure, because it is not mentioned in the message, but I suspect this is for a planned CT scan of the abdomen and pelvis.  Please see which she can find out about this because I do not know how to proceed without more information.  This patient has been having generalized abdominal pain, constipation for nearly 2 months and weight loss.  She has not improved with some treatments for constipation   All that is outlined in my most recent office consult note and a subsequent telephone call or portal message.    Ellwood Dense MD  _________________________________      ----- Message from April M sent at 09/07/2023  6:33 AM EDT ----- Regarding: PEER-TO-PEER Good Morning, I received this letter from the patient's insurance company requesting a peer-to-peer:  Thank you.   Status of Recent Request: Medical Director Review [employer] Tracking ID [id number]  Patient Name: Sheila Armstrong  Date(s) of Service: 09/09/2023 - 11/08/2023     Thank you for submitting a prior authorization (PA) to [employer]. We wanted to inform you that a peer to peer consultation is available since your request includes a date of service within the next 2 days.Your prior authorization is currently in Medical Director Review. If you have any additional documentation that could support your request, you may upload it to our portal. We want to ensure you are aware of all available options to support the timely processing of this PA. You can also fax or email your documentation to ensure all pertinent information is considered.  To schedule a peer to peer, please contact us [phone] Additionally, please have the following information ready:  Tracking ID of the case Name of MD, PA, or NP we will speak with Primary phone number and extension Secondary phone number and extension We appreciate the opportunity to partner with  you. You can visit the Portal at any time to submit new requests, view current requests, upload documentation, edit or withdraw submissions.

## 2023-09-08 ENCOUNTER — Encounter: Payer: Self-pay | Admitting: Family Medicine

## 2023-09-08 ENCOUNTER — Ambulatory Visit: Payer: Medicare PPO | Admitting: Family Medicine

## 2023-09-08 VITALS — BP 126/74 | HR 82 | Temp 97.7°F | Resp 16 | Ht 68.0 in | Wt 136.2 lb

## 2023-09-08 DIAGNOSIS — Z23 Encounter for immunization: Secondary | ICD-10-CM | POA: Diagnosis not present

## 2023-09-08 DIAGNOSIS — R109 Unspecified abdominal pain: Secondary | ICD-10-CM | POA: Diagnosis not present

## 2023-09-08 DIAGNOSIS — H524 Presbyopia: Secondary | ICD-10-CM | POA: Diagnosis not present

## 2023-09-08 DIAGNOSIS — M810 Age-related osteoporosis without current pathological fracture: Secondary | ICD-10-CM | POA: Diagnosis not present

## 2023-09-08 DIAGNOSIS — H26492 Other secondary cataract, left eye: Secondary | ICD-10-CM | POA: Diagnosis not present

## 2023-09-08 DIAGNOSIS — D649 Anemia, unspecified: Secondary | ICD-10-CM | POA: Diagnosis not present

## 2023-09-08 MED ORDER — DICYCLOMINE HCL 10 MG PO CAPS: ORAL_CAPSULE | ORAL | 1 refills | Status: AC

## 2023-09-08 MED ORDER — ALENDRONATE SODIUM 70 MG PO TABS: 70.0000 mg | ORAL_TABLET | ORAL | 11 refills | Status: DC

## 2023-09-08 NOTE — Progress Notes (Signed)
Chief Complaint  Patient presents with   Follow-up    Follow up    Subjective: Patient is a 88 y.o. female here for f/u.  Seen by GI and had low CBC. Workup neg. 40 yrs of low Hb, worked up and neg. No Fe, folate or B12 def. No blood in stool/urine, fatigue.  Osteoporosis: hx of osteoporosis.  Not on medication. Walks routinely.  Does not lift weights.  She has never been on medication for this.  Recent on Fosamax in the past but she was too scared to take it.  She is interested in having this done now.  Chronic right abdominal pain.  Sometimes worse after eating.  Described as cramping.  She has never taken a medication for this.  She has a CT scan through the GI team scheduled for tomorrow.  She does not appreciate any masses.  No nausea, vomiting, diarrhea.  Past Medical History:  Diagnosis Date   Abnormal uterine bleeding    ALLERGIC RHINITIS 07/07/2007   COLONIC POLYPS, ADENOMATOUS, HX OF 11/19/2005   CONSTIPATION 12/31/2009   DIVERTICULOSIS, COLON 07/12/2008   Endometrial cancer (HCC)    Family history of colon cancer    Hearing loss    History of radiation therapy 04/07/17-04/30/17   viaginal cuff treated to 30 Gy in 5 fractions   History of radiation therapy 04/07/17,04/14/17,04/21/17,04/30/17   endometrial- Dr. Roselind Messier    SEBORRHEIC KERATOSIS, INFLAMED 03/21/2008   SENILE VAGINITIS 01/17/2008    Objective: BP 126/74 (BP Location: Left Arm, Patient Position: Sitting, Cuff Size: Normal)   Pulse 82   Temp 97.7 F (36.5 C) (Oral)   Resp 16   Ht 5\' 8"  (1.727 m)   Wt 136 lb 3.2 oz (61.8 kg)   SpO2 99%   BMI 20.71 kg/m  General: Awake, appears stated age Heart: RRR Lungs: CTAB, no rales, wheezes or rhonchi. No accessory muscle use Abdomen: Bowel sounds present, soft, mild tenderness over the right abdominal region, nondistended, no masses/organomegaly. Psych: Age appropriate judgment and insight, normal affect and mood  Assessment and Plan: Age-related osteoporosis without  current pathological fracture - Plan: Comprehensive metabolic panel, VITAMIN D 25 Hydroxy (Vit-D Deficiency, Fractures)  Normocytic anemia - Plan: Pathologist smear review, CBC w/Diff  Abdominal cramping - Plan: dicyclomine (BENTYL) 10 MG capsule  Chronic, not controlled.  Check above.  Start Fosamax 70 mg weekly.  Vitamin D and calcium supplementation recommended.  Weightbearing activity recommended.  Follow-up in 6 months. Will check above, as long as nothing sinister, likely does not require a repeat hematology evaluation. Appreciate gastroenterology input.  Bentyl 10 mg 4 times daily as needed for cramping.  Could be stress related. Flu shot today. The patient voiced understanding and agreement to the plan.  Jilda Roche [place], DO 09/08/23  1:28 PM

## 2023-09-08 NOTE — Patient Instructions (Addendum)
Give [place] 4-5 business days to get the results of your labs back.   Take 1200 mg of calcium daily and at least 1000 units of vitamin D3 daily.   Aim to do some physical exertion for 150 minutes per week. This is typically divided into 5 days per week, 30 minutes per day. The activity should be enough to get your heart rate up. Anything is better than nothing if you have time constraints.  Please consider adding some weight resistance exercise to your routine. Consider yoga as well.   Let us know if you need anything.

## 2023-09-08 NOTE — Addendum Note (Signed)
Addended by: Mervin Kung A on: 09/08/2023 01:43 PM   Modules accepted: Orders

## 2023-09-08 NOTE — Addendum Note (Signed)
Addended by: Kathi Ludwig on: 09/08/2023 01:40 PM   Modules accepted: Orders

## 2023-09-09 ENCOUNTER — Ambulatory Visit (HOSPITAL_COMMUNITY): Payer: Medicare PPO

## 2023-09-09 LAB — COMPREHENSIVE METABOLIC PANEL
ALT: 9 U/L (ref 0–35)
AST: 18 U/L (ref 0–37)
Albumin: 4.3 g/dL (ref 3.5–5.2)
Alkaline Phosphatase: 49 U/L (ref 39–117)
BUN: 31 mg/dL — ABNORMAL HIGH (ref 6–23)
CO2: 30 meq/L (ref 19–32)
Calcium: 9.7 mg/dL (ref 8.4–10.5)
Chloride: 99 meq/L (ref 96–112)
Creatinine, Ser: 1.17 mg/dL (ref 0.40–1.20)
GFR: 42.4 mL/min — ABNORMAL LOW (ref >60.00)
Glucose, Bld: 106 mg/dL — ABNORMAL HIGH (ref 70–99)
Potassium: 4.9 meq/L (ref 3.5–5.1)
Sodium: 137 meq/L (ref 135–145)
Total Bilirubin: 0.3 mg/dL (ref 0.2–1.2)
Total Protein: 7.3 g/dL (ref 6.0–8.3)

## 2023-09-09 LAB — VITAMIN D 25 HYDROXY (VIT D DEFICIENCY, FRACTURES): VITD: 49.63 ng/mL (ref 30.00–100.00)

## 2023-09-10 ENCOUNTER — Encounter: Payer: Self-pay | Admitting: Family Medicine

## 2023-09-10 LAB — CBC WITH DIFFERENTIAL/PLATELET
Absolute Lymphocytes: 1210 {cells}/uL (ref 850–3900)
Absolute Monocytes: 503 {cells}/uL (ref 200–950)
Basophils Absolute: 41 {cells}/uL (ref 0–200)
Basophils Relative: 0.6 %
Eosinophils Absolute: 61 {cells}/uL (ref 15–500)
Eosinophils Relative: 0.9 %
HCT: 32.8 % — ABNORMAL LOW (ref 35.0–45.0)
Hemoglobin: 10.7 g/dL — ABNORMAL LOW (ref 11.7–15.5)
MCH: 31.1 pg (ref 27.0–33.0)
MCHC: 32.6 g/dL (ref 32.0–36.0)
MCV: 95.3 fL (ref 80.0–100.0)
MPV: 9.9 fL (ref 7.5–12.5)
Monocytes Relative: 7.4 %
Neutro Abs: 4984 {cells}/uL (ref 1500–7800)
Neutrophils Relative %: 73.3 %
Platelets: 224 10*3/uL (ref 140–400)
RBC: 3.44 10*6/uL — ABNORMAL LOW (ref 3.80–5.10)
RDW: 12.4 % (ref 11.0–15.0)
Total Lymphocyte: 17.8 %
WBC: 6.8 10*3/uL (ref 3.8–10.8)

## 2023-09-10 LAB — PATHOLOGIST SMEAR REVIEW

## 2023-09-14 DIAGNOSIS — J3081 Allergic rhinitis due to animal (cat) (dog) hair and dander: Secondary | ICD-10-CM | POA: Diagnosis not present

## 2023-09-14 DIAGNOSIS — J3089 Other allergic rhinitis: Secondary | ICD-10-CM | POA: Diagnosis not present

## 2023-09-14 DIAGNOSIS — J301 Allergic rhinitis due to pollen: Secondary | ICD-10-CM | POA: Diagnosis not present

## 2023-09-21 DIAGNOSIS — J301 Allergic rhinitis due to pollen: Secondary | ICD-10-CM | POA: Diagnosis not present

## 2023-09-21 DIAGNOSIS — J3089 Other allergic rhinitis: Secondary | ICD-10-CM | POA: Diagnosis not present

## 2023-09-21 DIAGNOSIS — J3081 Allergic rhinitis due to animal (cat) (dog) hair and dander: Secondary | ICD-10-CM | POA: Diagnosis not present

## 2023-10-05 DIAGNOSIS — J301 Allergic rhinitis due to pollen: Secondary | ICD-10-CM | POA: Diagnosis not present

## 2023-10-05 DIAGNOSIS — J3089 Other allergic rhinitis: Secondary | ICD-10-CM | POA: Diagnosis not present

## 2023-10-12 DIAGNOSIS — J301 Allergic rhinitis due to pollen: Secondary | ICD-10-CM | POA: Diagnosis not present

## 2023-10-12 DIAGNOSIS — J3089 Other allergic rhinitis: Secondary | ICD-10-CM | POA: Diagnosis not present

## 2023-10-12 DIAGNOSIS — J3081 Allergic rhinitis due to animal (cat) (dog) hair and dander: Secondary | ICD-10-CM | POA: Diagnosis not present

## 2023-10-19 DIAGNOSIS — J3089 Other allergic rhinitis: Secondary | ICD-10-CM | POA: Diagnosis not present

## 2023-10-19 DIAGNOSIS — J3081 Allergic rhinitis due to animal (cat) (dog) hair and dander: Secondary | ICD-10-CM | POA: Diagnosis not present

## 2023-10-19 DIAGNOSIS — J301 Allergic rhinitis due to pollen: Secondary | ICD-10-CM | POA: Diagnosis not present

## 2023-10-26 DIAGNOSIS — J3081 Allergic rhinitis due to animal (cat) (dog) hair and dander: Secondary | ICD-10-CM | POA: Diagnosis not present

## 2023-10-26 DIAGNOSIS — J301 Allergic rhinitis due to pollen: Secondary | ICD-10-CM | POA: Diagnosis not present

## 2023-10-26 DIAGNOSIS — J3089 Other allergic rhinitis: Secondary | ICD-10-CM | POA: Diagnosis not present

## 2023-11-05 DIAGNOSIS — J3089 Other allergic rhinitis: Secondary | ICD-10-CM | POA: Diagnosis not present

## 2023-11-05 DIAGNOSIS — J301 Allergic rhinitis due to pollen: Secondary | ICD-10-CM | POA: Diagnosis not present

## 2023-11-05 DIAGNOSIS — J3081 Allergic rhinitis due to animal (cat) (dog) hair and dander: Secondary | ICD-10-CM | POA: Diagnosis not present

## 2023-11-16 DIAGNOSIS — J3081 Allergic rhinitis due to animal (cat) (dog) hair and dander: Secondary | ICD-10-CM | POA: Diagnosis not present

## 2023-11-16 DIAGNOSIS — J3089 Other allergic rhinitis: Secondary | ICD-10-CM | POA: Diagnosis not present

## 2023-11-16 DIAGNOSIS — J301 Allergic rhinitis due to pollen: Secondary | ICD-10-CM | POA: Diagnosis not present

## 2023-11-25 ENCOUNTER — Telehealth: Payer: Self-pay | Admitting: Family Medicine

## 2023-11-25 NOTE — Telephone Encounter (Signed)
 Pt called and was advised per Dr.Wendling she may need appointment for insurance purposes. Pt scheduled  to discuss PT referral.

## 2023-11-25 NOTE — Telephone Encounter (Signed)
Copied from CRM [id number]. Topic: Clinical - Medication Question >> Nov 25, 2023  9:03 AM Adaysia C wrote: Reason for CRM: Patient is requesting prescribed medical physical therapy so she can start doing the recommended weight lifting exercises with out affecting her back injuries. Please follow up with patient #[phone]

## 2023-11-30 DIAGNOSIS — J301 Allergic rhinitis due to pollen: Secondary | ICD-10-CM | POA: Diagnosis not present

## 2023-11-30 DIAGNOSIS — J3089 Other allergic rhinitis: Secondary | ICD-10-CM | POA: Diagnosis not present

## 2023-11-30 DIAGNOSIS — J3081 Allergic rhinitis due to animal (cat) (dog) hair and dander: Secondary | ICD-10-CM | POA: Diagnosis not present

## 2023-12-01 ENCOUNTER — Encounter: Payer: Self-pay | Admitting: Family Medicine

## 2023-12-01 ENCOUNTER — Ambulatory Visit: Payer: Medicare PPO | Admitting: Family Medicine

## 2023-12-01 VITALS — BP 128/64 | HR 74 | Temp 98.0°F | Resp 16 | Ht 68.0 in | Wt 138.0 lb

## 2023-12-01 DIAGNOSIS — R29898 Other symptoms and signs involving the musculoskeletal system: Secondary | ICD-10-CM

## 2023-12-01 DIAGNOSIS — M419 Scoliosis, unspecified: Secondary | ICD-10-CM | POA: Diagnosis not present

## 2023-12-01 NOTE — Patient Instructions (Signed)
Stay active.  If you do not hear anything about your referral in the next 1-2 weeks, call our office and ask for an update.  Let us know if you need anything.

## 2023-12-01 NOTE — Progress Notes (Signed)
Chief Complaint  Patient presents with   Referral    Discuss Referral    Subjective: Patient is a 89 y.o. female here for f/u.  Patient is requesting formal physical therapy.  She has a history of scoliosis and slight deconditioning.  She was told to lift weights but does not know how to use any equipment in her gym.  She is hoping she can go to physical therapy and learn how to use them safely.  She had physical therapy with the [facility] team in the past and notes that they have lots of gym equipment there.  She is requesting referral there.  No pain.  Past Medical History:  Diagnosis Date   Abnormal uterine bleeding    ALLERGIC RHINITIS 07/07/2007   COLONIC POLYPS, ADENOMATOUS, HX OF 11/19/2005   CONSTIPATION 12/31/2009   DIVERTICULOSIS, COLON 07/12/2008   Endometrial cancer (HCC)    Family history of colon cancer    Hearing loss    History of radiation therapy 04/07/17-04/30/17   viaginal cuff treated to 30 Gy in 5 fractions   History of radiation therapy 04/07/17,04/14/17,04/21/17,04/30/17   endometrial- Dr. Roselind Messier    SEBORRHEIC KERATOSIS, INFLAMED 03/21/2008   SENILE VAGINITIS 01/17/2008    Objective: BP 128/64   Pulse 74   Temp 98 F (36.7 C) (Oral)   Resp 16   Ht 5\' 8"  (1.727 m)   Wt 138 lb (62.6 kg)   SpO2 95%   BMI 20.98 kg/m  General: Awake, appears stated age MSK: + Levoscoliosis in the thoracolumbar region, no TTP Neuro: 5/5 strength in lower extremities, 4/5 strength with shoulder external rotation, elbow flexion; 5/5 strength in elbow extension and internal rotation of the shoulders.  DTRs equal and symmetric throughout.  No clonus, no cerebellar signs.  Gait is cautious. Lungs: No accessory muscle use Psych: Age appropriate judgment and insight, normal affect and mood  Assessment and Plan: Scoliosis of thoracolumbar spine, unspecified scoliosis type - Plan: Ambulatory referral to Physical Therapy  Muscular deconditioning - Plan: Ambulatory referral to Physical  Therapy  Will refer to physical therapy as above at [facility].  Commended her on pursuing strength training.  No neurologic concerns.  Follow-up as originally scheduled. The patient voiced understanding and agreement to the plan.  Jilda Roche [place], DO 12/01/23  10:43 AM

## 2023-12-07 ENCOUNTER — Encounter: Payer: Self-pay | Admitting: Family Medicine

## 2023-12-07 DIAGNOSIS — M419 Scoliosis, unspecified: Secondary | ICD-10-CM | POA: Diagnosis not present

## 2023-12-07 DIAGNOSIS — J301 Allergic rhinitis due to pollen: Secondary | ICD-10-CM | POA: Diagnosis not present

## 2023-12-07 DIAGNOSIS — J3089 Other allergic rhinitis: Secondary | ICD-10-CM | POA: Diagnosis not present

## 2023-12-07 DIAGNOSIS — J3081 Allergic rhinitis due to animal (cat) (dog) hair and dander: Secondary | ICD-10-CM | POA: Diagnosis not present

## 2023-12-14 DIAGNOSIS — M419 Scoliosis, unspecified: Secondary | ICD-10-CM | POA: Diagnosis not present

## 2023-12-14 DIAGNOSIS — K130 Diseases of lips: Secondary | ICD-10-CM | POA: Diagnosis not present

## 2023-12-14 DIAGNOSIS — J3089 Other allergic rhinitis: Secondary | ICD-10-CM | POA: Diagnosis not present

## 2023-12-14 DIAGNOSIS — L821 Other seborrheic keratosis: Secondary | ICD-10-CM | POA: Diagnosis not present

## 2023-12-14 DIAGNOSIS — D225 Melanocytic nevi of trunk: Secondary | ICD-10-CM | POA: Diagnosis not present

## 2023-12-14 DIAGNOSIS — J3081 Allergic rhinitis due to animal (cat) (dog) hair and dander: Secondary | ICD-10-CM | POA: Diagnosis not present

## 2023-12-14 DIAGNOSIS — J301 Allergic rhinitis due to pollen: Secondary | ICD-10-CM | POA: Diagnosis not present

## 2023-12-15 DIAGNOSIS — M419 Scoliosis, unspecified: Secondary | ICD-10-CM | POA: Diagnosis not present

## 2023-12-21 DIAGNOSIS — J3081 Allergic rhinitis due to animal (cat) (dog) hair and dander: Secondary | ICD-10-CM | POA: Diagnosis not present

## 2023-12-21 DIAGNOSIS — M419 Scoliosis, unspecified: Secondary | ICD-10-CM | POA: Diagnosis not present

## 2023-12-21 DIAGNOSIS — J301 Allergic rhinitis due to pollen: Secondary | ICD-10-CM | POA: Diagnosis not present

## 2023-12-21 DIAGNOSIS — J3089 Other allergic rhinitis: Secondary | ICD-10-CM | POA: Diagnosis not present

## 2023-12-28 DIAGNOSIS — J3081 Allergic rhinitis due to animal (cat) (dog) hair and dander: Secondary | ICD-10-CM | POA: Diagnosis not present

## 2023-12-28 DIAGNOSIS — J301 Allergic rhinitis due to pollen: Secondary | ICD-10-CM | POA: Diagnosis not present

## 2023-12-28 DIAGNOSIS — J3089 Other allergic rhinitis: Secondary | ICD-10-CM | POA: Diagnosis not present

## 2023-12-28 DIAGNOSIS — M419 Scoliosis, unspecified: Secondary | ICD-10-CM | POA: Diagnosis not present

## 2023-12-29 DIAGNOSIS — M419 Scoliosis, unspecified: Secondary | ICD-10-CM | POA: Diagnosis not present

## 2024-01-01 DIAGNOSIS — H26493 Other secondary cataract, bilateral: Secondary | ICD-10-CM | POA: Diagnosis not present

## 2024-01-01 DIAGNOSIS — Z961 Presence of intraocular lens: Secondary | ICD-10-CM | POA: Diagnosis not present

## 2024-01-01 DIAGNOSIS — H532 Diplopia: Secondary | ICD-10-CM | POA: Diagnosis not present

## 2024-01-01 DIAGNOSIS — H527 Unspecified disorder of refraction: Secondary | ICD-10-CM | POA: Diagnosis not present

## 2024-01-04 DIAGNOSIS — M419 Scoliosis, unspecified: Secondary | ICD-10-CM | POA: Diagnosis not present

## 2024-01-04 DIAGNOSIS — J3089 Other allergic rhinitis: Secondary | ICD-10-CM | POA: Diagnosis not present

## 2024-01-04 DIAGNOSIS — J301 Allergic rhinitis due to pollen: Secondary | ICD-10-CM | POA: Diagnosis not present

## 2024-01-04 DIAGNOSIS — J3081 Allergic rhinitis due to animal (cat) (dog) hair and dander: Secondary | ICD-10-CM | POA: Diagnosis not present

## 2024-01-11 DIAGNOSIS — J3081 Allergic rhinitis due to animal (cat) (dog) hair and dander: Secondary | ICD-10-CM | POA: Diagnosis not present

## 2024-01-11 DIAGNOSIS — J301 Allergic rhinitis due to pollen: Secondary | ICD-10-CM | POA: Diagnosis not present

## 2024-01-11 DIAGNOSIS — J3089 Other allergic rhinitis: Secondary | ICD-10-CM | POA: Diagnosis not present

## 2024-01-12 ENCOUNTER — Ambulatory Visit: Payer: Medicare PPO | Admitting: Gastroenterology

## 2024-01-12 DIAGNOSIS — M419 Scoliosis, unspecified: Secondary | ICD-10-CM | POA: Diagnosis not present

## 2024-01-18 DIAGNOSIS — M419 Scoliosis, unspecified: Secondary | ICD-10-CM | POA: Diagnosis not present

## 2024-01-19 DIAGNOSIS — J3089 Other allergic rhinitis: Secondary | ICD-10-CM | POA: Diagnosis not present

## 2024-01-19 DIAGNOSIS — J3081 Allergic rhinitis due to animal (cat) (dog) hair and dander: Secondary | ICD-10-CM | POA: Diagnosis not present

## 2024-01-19 DIAGNOSIS — J301 Allergic rhinitis due to pollen: Secondary | ICD-10-CM | POA: Diagnosis not present

## 2024-01-25 DIAGNOSIS — J301 Allergic rhinitis due to pollen: Secondary | ICD-10-CM | POA: Diagnosis not present

## 2024-01-25 DIAGNOSIS — M419 Scoliosis, unspecified: Secondary | ICD-10-CM | POA: Diagnosis not present

## 2024-01-25 DIAGNOSIS — J3089 Other allergic rhinitis: Secondary | ICD-10-CM | POA: Diagnosis not present

## 2024-01-25 DIAGNOSIS — J3081 Allergic rhinitis due to animal (cat) (dog) hair and dander: Secondary | ICD-10-CM | POA: Diagnosis not present

## 2024-02-01 DIAGNOSIS — J301 Allergic rhinitis due to pollen: Secondary | ICD-10-CM | POA: Diagnosis not present

## 2024-02-01 DIAGNOSIS — J3081 Allergic rhinitis due to animal (cat) (dog) hair and dander: Secondary | ICD-10-CM | POA: Diagnosis not present

## 2024-02-01 DIAGNOSIS — T63421D Toxic effect of venom of ants, accidental (unintentional), subsequent encounter: Secondary | ICD-10-CM | POA: Diagnosis not present

## 2024-02-01 DIAGNOSIS — J3089 Other allergic rhinitis: Secondary | ICD-10-CM | POA: Diagnosis not present

## 2024-02-08 DIAGNOSIS — J3089 Other allergic rhinitis: Secondary | ICD-10-CM | POA: Diagnosis not present

## 2024-02-08 DIAGNOSIS — J301 Allergic rhinitis due to pollen: Secondary | ICD-10-CM | POA: Diagnosis not present

## 2024-02-08 DIAGNOSIS — J3081 Allergic rhinitis due to animal (cat) (dog) hair and dander: Secondary | ICD-10-CM | POA: Diagnosis not present

## 2024-02-10 DIAGNOSIS — J301 Allergic rhinitis due to pollen: Secondary | ICD-10-CM | POA: Diagnosis not present

## 2024-02-10 DIAGNOSIS — J3089 Other allergic rhinitis: Secondary | ICD-10-CM | POA: Diagnosis not present

## 2024-02-15 DIAGNOSIS — M419 Scoliosis, unspecified: Secondary | ICD-10-CM | POA: Diagnosis not present

## 2024-02-16 DIAGNOSIS — J301 Allergic rhinitis due to pollen: Secondary | ICD-10-CM | POA: Diagnosis not present

## 2024-02-16 DIAGNOSIS — J3081 Allergic rhinitis due to animal (cat) (dog) hair and dander: Secondary | ICD-10-CM | POA: Diagnosis not present

## 2024-02-16 DIAGNOSIS — J3089 Other allergic rhinitis: Secondary | ICD-10-CM | POA: Diagnosis not present

## 2024-02-22 DIAGNOSIS — J301 Allergic rhinitis due to pollen: Secondary | ICD-10-CM | POA: Diagnosis not present

## 2024-02-22 DIAGNOSIS — J3081 Allergic rhinitis due to animal (cat) (dog) hair and dander: Secondary | ICD-10-CM | POA: Diagnosis not present

## 2024-02-22 DIAGNOSIS — J3089 Other allergic rhinitis: Secondary | ICD-10-CM | POA: Diagnosis not present

## 2024-02-23 DIAGNOSIS — M419 Scoliosis, unspecified: Secondary | ICD-10-CM | POA: Diagnosis not present

## 2024-02-29 DIAGNOSIS — J3081 Allergic rhinitis due to animal (cat) (dog) hair and dander: Secondary | ICD-10-CM | POA: Diagnosis not present

## 2024-02-29 DIAGNOSIS — M419 Scoliosis, unspecified: Secondary | ICD-10-CM | POA: Diagnosis not present

## 2024-02-29 DIAGNOSIS — J301 Allergic rhinitis due to pollen: Secondary | ICD-10-CM | POA: Diagnosis not present

## 2024-02-29 DIAGNOSIS — J3089 Other allergic rhinitis: Secondary | ICD-10-CM | POA: Diagnosis not present

## 2024-03-07 DIAGNOSIS — J301 Allergic rhinitis due to pollen: Secondary | ICD-10-CM | POA: Diagnosis not present

## 2024-03-07 DIAGNOSIS — M419 Scoliosis, unspecified: Secondary | ICD-10-CM | POA: Diagnosis not present

## 2024-03-07 DIAGNOSIS — J3089 Other allergic rhinitis: Secondary | ICD-10-CM | POA: Diagnosis not present

## 2024-03-07 DIAGNOSIS — J3081 Allergic rhinitis due to animal (cat) (dog) hair and dander: Secondary | ICD-10-CM | POA: Diagnosis not present

## 2024-03-14 DIAGNOSIS — M419 Scoliosis, unspecified: Secondary | ICD-10-CM | POA: Diagnosis not present

## 2024-03-14 DIAGNOSIS — J3089 Other allergic rhinitis: Secondary | ICD-10-CM | POA: Diagnosis not present

## 2024-03-14 DIAGNOSIS — J301 Allergic rhinitis due to pollen: Secondary | ICD-10-CM | POA: Diagnosis not present

## 2024-03-14 DIAGNOSIS — J3081 Allergic rhinitis due to animal (cat) (dog) hair and dander: Secondary | ICD-10-CM | POA: Diagnosis not present

## 2024-03-15 DIAGNOSIS — M419 Scoliosis, unspecified: Secondary | ICD-10-CM | POA: Diagnosis not present

## 2024-03-21 DIAGNOSIS — J301 Allergic rhinitis due to pollen: Secondary | ICD-10-CM | POA: Diagnosis not present

## 2024-03-21 DIAGNOSIS — J3081 Allergic rhinitis due to animal (cat) (dog) hair and dander: Secondary | ICD-10-CM | POA: Diagnosis not present

## 2024-03-21 DIAGNOSIS — J3089 Other allergic rhinitis: Secondary | ICD-10-CM | POA: Diagnosis not present

## 2024-03-28 DIAGNOSIS — J3081 Allergic rhinitis due to animal (cat) (dog) hair and dander: Secondary | ICD-10-CM | POA: Diagnosis not present

## 2024-03-28 DIAGNOSIS — J3089 Other allergic rhinitis: Secondary | ICD-10-CM | POA: Diagnosis not present

## 2024-03-28 DIAGNOSIS — J301 Allergic rhinitis due to pollen: Secondary | ICD-10-CM | POA: Diagnosis not present

## 2024-04-05 DIAGNOSIS — J3081 Allergic rhinitis due to animal (cat) (dog) hair and dander: Secondary | ICD-10-CM | POA: Diagnosis not present

## 2024-04-05 DIAGNOSIS — J301 Allergic rhinitis due to pollen: Secondary | ICD-10-CM | POA: Diagnosis not present

## 2024-04-05 DIAGNOSIS — J3089 Other allergic rhinitis: Secondary | ICD-10-CM | POA: Diagnosis not present

## 2024-04-11 DIAGNOSIS — J3089 Other allergic rhinitis: Secondary | ICD-10-CM | POA: Diagnosis not present

## 2024-04-11 DIAGNOSIS — J301 Allergic rhinitis due to pollen: Secondary | ICD-10-CM | POA: Diagnosis not present

## 2024-04-11 DIAGNOSIS — J3081 Allergic rhinitis due to animal (cat) (dog) hair and dander: Secondary | ICD-10-CM | POA: Diagnosis not present

## 2024-04-18 DIAGNOSIS — J3089 Other allergic rhinitis: Secondary | ICD-10-CM | POA: Diagnosis not present

## 2024-04-18 DIAGNOSIS — J301 Allergic rhinitis due to pollen: Secondary | ICD-10-CM | POA: Diagnosis not present

## 2024-04-18 DIAGNOSIS — J3081 Allergic rhinitis due to animal (cat) (dog) hair and dander: Secondary | ICD-10-CM | POA: Diagnosis not present

## 2024-04-25 DIAGNOSIS — J3089 Other allergic rhinitis: Secondary | ICD-10-CM | POA: Diagnosis not present

## 2024-04-25 DIAGNOSIS — J301 Allergic rhinitis due to pollen: Secondary | ICD-10-CM | POA: Diagnosis not present

## 2024-04-25 DIAGNOSIS — J3081 Allergic rhinitis due to animal (cat) (dog) hair and dander: Secondary | ICD-10-CM | POA: Diagnosis not present

## 2024-05-02 DIAGNOSIS — J3081 Allergic rhinitis due to animal (cat) (dog) hair and dander: Secondary | ICD-10-CM | POA: Diagnosis not present

## 2024-05-02 DIAGNOSIS — J3089 Other allergic rhinitis: Secondary | ICD-10-CM | POA: Diagnosis not present

## 2024-05-02 DIAGNOSIS — J301 Allergic rhinitis due to pollen: Secondary | ICD-10-CM | POA: Diagnosis not present

## 2024-05-05 ENCOUNTER — Encounter: Admitting: Obstetrics and Gynecology

## 2024-05-09 DIAGNOSIS — J301 Allergic rhinitis due to pollen: Secondary | ICD-10-CM | POA: Diagnosis not present

## 2024-05-09 DIAGNOSIS — J3081 Allergic rhinitis due to animal (cat) (dog) hair and dander: Secondary | ICD-10-CM | POA: Diagnosis not present

## 2024-05-09 DIAGNOSIS — J3089 Other allergic rhinitis: Secondary | ICD-10-CM | POA: Diagnosis not present

## 2024-05-16 DIAGNOSIS — J301 Allergic rhinitis due to pollen: Secondary | ICD-10-CM | POA: Diagnosis not present

## 2024-05-16 DIAGNOSIS — J3081 Allergic rhinitis due to animal (cat) (dog) hair and dander: Secondary | ICD-10-CM | POA: Diagnosis not present

## 2024-05-16 DIAGNOSIS — J3089 Other allergic rhinitis: Secondary | ICD-10-CM | POA: Diagnosis not present

## 2024-05-18 ENCOUNTER — Ambulatory Visit: Admitting: Family Medicine

## 2024-05-18 ENCOUNTER — Encounter: Payer: Self-pay | Admitting: Family Medicine

## 2024-05-18 VITALS — BP 120/64 | HR 81 | Temp 97.7°F | Resp 16 | Ht 68.0 in | Wt 134.6 lb

## 2024-05-18 DIAGNOSIS — H903 Sensorineural hearing loss, bilateral: Secondary | ICD-10-CM

## 2024-05-18 NOTE — Progress Notes (Signed)
 Chief Complaint  Patient presents with   Hearing difficulty    Discuss Referral     Subjective: Patient is a 89 y.o. female here for referral.  Patient has a history of being hard of hearing.  She wears quality hearing aids and still has difficulty.  She has an appointment with the audiology team at [facility] to discuss cochlear implants.  She is requiring a referral and is requesting 1 from us  today.  Past Medical History:  Diagnosis Date   Abnormal uterine bleeding    ALLERGIC RHINITIS 07/07/2007   COLONIC POLYPS, ADENOMATOUS, HX OF 11/19/2005   CONSTIPATION 12/31/2009   DIVERTICULOSIS, COLON 07/12/2008   Endometrial cancer (HCC)    Family history of colon cancer    Hearing loss    History of radiation therapy 04/07/17-04/30/17   viaginal cuff treated to 30 Gy in 5 fractions   History of radiation therapy 04/07/17,04/14/17,04/21/17,04/30/17   endometrial- Dr. Shannon    SEBORRHEIC KERATOSIS, INFLAMED 03/21/2008   SENILE VAGINITIS 01/17/2008    Objective: BP 120/64 (BP Location: Left Arm, Patient Position: Sitting)   Pulse 81   Temp 97.7 F (36.5 C) (Oral)   Resp 16   Ht 5' 8 (1.727 m)   Wt 134 lb 9.6 oz (61.1 kg)   SpO2 99%   BMI 20.47 kg/m  General: Awake, appears stated age Ears: HOH, right canal patent, TM negative with exception of minimal sclerosis, left canal 70% obstructed with cerumen, TM negative Lungs: No accessory muscle use Psych: Age appropriate judgment and insight, normal affect and mood  Assessment and Plan: Sensorineural hearing loss (SNHL), bilateral - Plan: Ambulatory referral to Audiology  Referral placed for audiology for ins purposes.  No obvious cerumen involvement.  Follow-up as originally scheduled. The patient voiced understanding and agreement to the plan.  Mabel Mt [place], DO 05/18/24  9:14 AM

## 2024-05-18 NOTE — Patient Instructions (Addendum)
 If you do not hear anything about your referral in the next 1-2 weeks, call our office and ask for an update.  Let us know if you need anything.

## 2024-05-30 DIAGNOSIS — J3081 Allergic rhinitis due to animal (cat) (dog) hair and dander: Secondary | ICD-10-CM | POA: Diagnosis not present

## 2024-05-30 DIAGNOSIS — J3089 Other allergic rhinitis: Secondary | ICD-10-CM | POA: Diagnosis not present

## 2024-05-30 DIAGNOSIS — J301 Allergic rhinitis due to pollen: Secondary | ICD-10-CM | POA: Diagnosis not present

## 2024-06-06 DIAGNOSIS — J3089 Other allergic rhinitis: Secondary | ICD-10-CM | POA: Diagnosis not present

## 2024-06-06 DIAGNOSIS — J301 Allergic rhinitis due to pollen: Secondary | ICD-10-CM | POA: Diagnosis not present

## 2024-06-06 DIAGNOSIS — J3081 Allergic rhinitis due to animal (cat) (dog) hair and dander: Secondary | ICD-10-CM | POA: Diagnosis not present

## 2024-06-13 ENCOUNTER — Encounter: Admitting: Obstetrics and Gynecology

## 2024-06-13 ENCOUNTER — Encounter: Payer: Self-pay | Admitting: Obstetrics and Gynecology

## 2024-06-13 DIAGNOSIS — J3081 Allergic rhinitis due to animal (cat) (dog) hair and dander: Secondary | ICD-10-CM | POA: Diagnosis not present

## 2024-06-13 DIAGNOSIS — J301 Allergic rhinitis due to pollen: Secondary | ICD-10-CM | POA: Diagnosis not present

## 2024-06-13 DIAGNOSIS — J3089 Other allergic rhinitis: Secondary | ICD-10-CM | POA: Diagnosis not present

## 2024-06-13 NOTE — Progress Notes (Deleted)
 89 y.o. G69P2012 Married Caucasian female here for a breast and pelvic exam.    The patient is also followed for ***.  PCP: Frann Mabel Mt, DO   No LMP recorded. Patient has had a hysterectomy.           Sexually active: No.  The current method of family planning is post menopausal status.    Menopausal hormone therapy:  n/a Exercising: {yes no:314532}  {types:19826} Smoker:  no  OB History     Gravida  3   Para  2   Term  2   Preterm      AB  1   Living  2      SAB  1   IAB      Ectopic      Multiple      Live Births  2           HEALTH MAINTENANCE: Last 2 paps: 04/16/18 neg, 01/21/17 neg History of abnormal Pap or positive HPV:  no Mammogram:  06/02/22 Breast Density Cat C, BIRADS Cat 1 neg  Colonoscopy:  06/16/18  Bone Density:  08/29/21  Result  osteoporotic    Immunization History  Administered Date(s) Administered   Fluad Quad(high Dose 65+) 08/09/2019, 08/23/2020, 08/28/2021   Fluad Trivalent(High Dose 65+) 09/08/2023   Influenza Split 08/26/2011, 08/26/2012, 08/05/2013   Influenza Whole 11/10/2005, 09/17/2007, 08/21/2008, 08/13/2009, 08/07/2010   Influenza, High Dose Seasonal PF 09/06/2014, 09/03/2015, 07/22/2016, 07/24/2016, 08/07/2018, 01/07/2022, 08/30/2022   Influenza-Unspecified 08/17/2017, 08/11/2022   PFIZER(Purple Top)SARS-COV-2 Vaccination 11/18/2019, 12/09/2019, 07/03/2020   Pfizer Covid-19 Vaccine Bivalent Booster 43yrs & up 10/08/2021   Pneumococcal Conjugate-13 03/14/2014   Pneumococcal Polysaccharide-23 11/11/2003, 01/25/2009   Td 11/10/2000   Tdap 02/13/2011, 05/07/2021   Zoster Recombinant(Shingrix) 08/07/2018, 08/25/2019   Zoster, Live 05/17/2010      reports that she has never smoked. She has never used smokeless tobacco. She reports that she does not drink alcohol and does not use drugs.  Past Medical History:  Diagnosis Date   Abnormal uterine bleeding    ALLERGIC RHINITIS 07/07/2007   COLONIC POLYPS,  ADENOMATOUS, HX OF 11/19/2005   CONSTIPATION 12/31/2009   DIVERTICULOSIS, COLON 07/12/2008   Endometrial cancer (HCC)    Family history of colon cancer    Hearing loss    History of radiation therapy 04/07/17-04/30/17   viaginal cuff treated to 30 Gy in 5 fractions   History of radiation therapy 04/07/17,04/14/17,04/21/17,04/30/17   endometrial- Dr. Shannon    SEBORRHEIC KERATOSIS, INFLAMED 03/21/2008   SENILE VAGINITIS 01/17/2008    Past Surgical History:  Procedure Laterality Date   BREAST BIOPSY     COSMETIC SURGERY     DILATION AND CURETTAGE OF UTERUS     EYELID IMPLANT     right and left   RHINOPLASTY     ROBOTIC ASSISTED TOTAL HYSTERECTOMY WITH BILATERAL SALPINGO OOPHERECTOMY Bilateral 02/24/2017   Procedure: XI ROBOTIC ASSISTED TOTAL HYSTERECTOMY WITH BILATERAL SALPINGO OOPHORECTOMY;  Surgeon: Elenore Simmonds, MD;  Location: [facility];  Service: Gynecology;  Laterality: Bilateral;   SENTINEL NODE BIOPSY N/A 02/24/2017   Procedure: SENTINEL NODE BIOPSY;  Surgeon: Elenore Simmonds, MD;  Location: [facility];  Service: Gynecology;  Laterality: N/A;   TONSILECTOMY/ADENOIDECTOMY WITH MYRINGOTOMY      Current Outpatient Medications  Medication Sig Dispense Refill   alendronate  (FOSAMAX ) 70 MG tablet Take 1 tablet (70 mg total) by mouth every 7 (seven) days. Take with a full glass of water  on an empty stomach. 4  tablet 11   calcium-vitamin D  (OSCAL WITH D) 500-200 MG-UNIT tablet Take 1 tablet by mouth daily with breakfast.     Cyanocobalamin  (VITAMIN B12 PO)      dicyclomine  (BENTYL ) 10 MG capsule Take 1 tab every 6 hours as needed for abdominal cramping. 30 capsule 1   EPINEPHrine 0.3 mg/0.3 mL IJ SOAJ injection   0   fluticasone (FLONASE) 50 MCG/ACT nasal spray      Multiple Vitamin (MULTIVITAMIN) capsule Take 1 capsule by mouth daily.     NON FORMULARY Allergy Injections- weekly     Probiotic Product (ALIGN) 4 MG CAPS Take 1 tablet by mouth daily.     VITAMIN D  PO      No current  facility-administered medications for this visit.    ALLERGIES: Amoxicillin , Sulfonamide derivatives, Sulfur, Thimerosal (thiomersal), and Sudafed [pseudoephedrine]  Family History  Problem Relation Age of Onset   Colon cancer Mother 32   Heart disease Father    Anuerysm Sister    COPD Sister    Esophageal cancer Neg Hx    Stomach cancer Neg Hx    Breast cancer Neg Hx     Review of Systems  PHYSICAL EXAM:  There were no vitals taken for this visit.    General appearance: alert, cooperative and appears stated age Head: normocephalic, without obvious abnormality, atraumatic Neck: no adenopathy, supple, symmetrical, trachea midline and thyroid  normal to inspection and palpation Lungs: clear to auscultation bilaterally Breasts: normal appearance, no masses or tenderness, No nipple retraction or dimpling, No nipple discharge or bleeding, No axillary adenopathy Heart: regular rate and rhythm Abdomen: soft, non-tender; no masses, no organomegaly Extremities: extremities normal, atraumatic, no cyanosis or edema Skin: skin color, texture, turgor normal. No rashes or lesions Lymph nodes: cervical, supraclavicular, and axillary nodes normal. Neurologic: grossly normal  Pelvic: External genitalia:  no lesions              No abnormal inguinal nodes palpated.              Urethra:  normal appearing urethra with no masses, tenderness or lesions              Bartholins and Skenes: normal                 Vagina: normal appearing vagina with normal color and discharge, no lesions              Cervix: no lesions              Pap taken: {yes no:314532} Bimanual Exam:  Uterus:  normal size, contour, position, consistency, mobility, non-tender              Adnexa: no mass, fullness, tenderness              Rectal exam: {yes no:314532}.  Confirms.              Anus:  normal sphincter tone, no lesions  Chaperone was present for exam:  {BSCHAPERONE:31226::Emily F, CMA}  ASSESSMENT: Encounter  for breast and pelvic exam.   ***  PLAN: Mammogram screening discussed. Self breast awareness reviewed. Pap and HRV collected:  {yes no:314532} Guidelines for Calcium, Vitamin D , regular exercise program including cardiovascular and weight bearing exercise. Medication refills:  *** {LABS (Optional):23779} Follow up:  ***    Additional counseling given.  {yes [id number]. ***  total time was spent for this patient encounter, including preparation, face-to-face counseling with the patient, coordination of care, and documentation  of the encounter in addition to doing the breast and pelvic exam.

## 2024-06-20 DIAGNOSIS — J301 Allergic rhinitis due to pollen: Secondary | ICD-10-CM | POA: Diagnosis not present

## 2024-06-20 DIAGNOSIS — Z974 Presence of external hearing-aid: Secondary | ICD-10-CM | POA: Diagnosis not present

## 2024-06-20 DIAGNOSIS — H905 Unspecified sensorineural hearing loss: Secondary | ICD-10-CM | POA: Diagnosis not present

## 2024-06-20 DIAGNOSIS — J3089 Other allergic rhinitis: Secondary | ICD-10-CM | POA: Diagnosis not present

## 2024-06-20 DIAGNOSIS — J3081 Allergic rhinitis due to animal (cat) (dog) hair and dander: Secondary | ICD-10-CM | POA: Diagnosis not present

## 2024-06-20 DIAGNOSIS — H6123 Impacted cerumen, bilateral: Secondary | ICD-10-CM | POA: Diagnosis not present

## 2024-06-27 DIAGNOSIS — J3089 Other allergic rhinitis: Secondary | ICD-10-CM | POA: Diagnosis not present

## 2024-06-27 DIAGNOSIS — J301 Allergic rhinitis due to pollen: Secondary | ICD-10-CM | POA: Diagnosis not present

## 2024-06-27 DIAGNOSIS — J3081 Allergic rhinitis due to animal (cat) (dog) hair and dander: Secondary | ICD-10-CM | POA: Diagnosis not present

## 2024-06-28 ENCOUNTER — Encounter

## 2024-06-30 DIAGNOSIS — Z461 Encounter for fitting and adjustment of hearing aid: Secondary | ICD-10-CM | POA: Diagnosis not present

## 2024-07-01 ENCOUNTER — Ambulatory Visit (INDEPENDENT_AMBULATORY_CARE_PROVIDER_SITE_OTHER): Admitting: *Deleted

## 2024-07-01 VITALS — Ht 68.0 in | Wt 134.0 lb

## 2024-07-01 DIAGNOSIS — Z0000 Encounter for general adult medical examination without abnormal findings: Secondary | ICD-10-CM

## 2024-07-01 DIAGNOSIS — M810 Age-related osteoporosis without current pathological fracture: Secondary | ICD-10-CM | POA: Diagnosis not present

## 2024-07-01 NOTE — Progress Notes (Signed)
 Subjective:   Sheila Armstrong is a 89 y.o. who presents for a Medicare Wellness preventive visit.  As a reminder, Annual Wellness Visits don't include a physical exam, and some assessments may be limited, especially if this visit is performed virtually. We may recommend an in-person follow-up visit with your provider if needed.  Visit Complete: In person  VideoDeclined- This patient declined Interactive audio and Acupuncturist. Therefore the visit was completed with audio only.  Persons Participating in Visit: Patient.  AWV Questionnaire: Yes: Patient Medicare AWV questionnaire was completed by the patient on 06/21/24; I have confirmed that all information answered by patient is correct and no changes since this date.  Cardiac Risk Factors include: advanced age (>3men, >72 women);Other (see comment), Risk factor comments: history of endometrial cancer     Objective:    Today's Vitals   07/01/24 1024  Weight: 134 lb (60.8 kg)  Height: 5' 8 (1.727 m)   Body mass index is 20.37 kg/m.     07/01/2024   10:33 AM 05/20/2023    4:05 PM 03/14/2022    3:52 PM 02/20/2022   11:09 AM 02/14/2021   11:16 AM 08/01/2020    9:19 AM 01/26/2020   11:48 AM  Advanced Directives  Does Patient Have a Medical Advance Directive? Yes Yes Yes Yes Yes Yes Yes  Type of Estate agent of [place];Living will Healthcare Power of [place];Living will Healthcare Power of [place];Living will Healthcare Power of [place];Living will Healthcare Power of [place];Living will Healthcare Power of [place];Living will Healthcare Power of [place];Living will  Does patient want to make changes to medical advance directive? No - Patient declined No - Patient declined  No - Patient declined No - Patient declined    Copy of Healthcare Power of Attorney in Chart? No - copy requested No - copy requested No - copy requested No - copy requested No - copy requested No - copy requested No - copy  requested  Would patient like information on creating a medical advance directive?   No - Patient declined        Current Medications (verified) Outpatient Encounter Medications as of 07/01/2024  Medication Sig   alendronate  (FOSAMAX ) 70 MG tablet Take 1 tablet (70 mg total) by mouth every 7 (seven) days. Take with a full glass of water  on an empty stomach.   calcium-vitamin D  (OSCAL WITH D) 500-200 MG-UNIT tablet Take 1 tablet by mouth daily with breakfast.   Cyanocobalamin  (VITAMIN B12 PO)    dicyclomine  (BENTYL ) 10 MG capsule Take 1 tab every 6 hours as needed for abdominal cramping.   EPINEPHrine 0.3 mg/0.3 mL IJ SOAJ injection    fluticasone (FLONASE) 50 MCG/ACT nasal spray    Multiple Vitamin (MULTIVITAMIN) capsule Take 1 capsule by mouth daily.   NON FORMULARY Allergy Injections- weekly   Probiotic Product (ALIGN) 4 MG CAPS Take 1 tablet by mouth daily.   VITAMIN D  PO    No facility-administered encounter medications on file as of 07/01/2024.    Allergies (verified) Amoxicillin , Sulfonamide derivatives, Sulfur, Thimerosal (thiomersal), and Sudafed [pseudoephedrine]   History: Past Medical History:  Diagnosis Date   Abnormal uterine bleeding    ALLERGIC RHINITIS 07/07/2007   COLONIC POLYPS, ADENOMATOUS, HX OF 11/19/2005   CONSTIPATION 12/31/2009   DIVERTICULOSIS, COLON 07/12/2008   Endometrial cancer (HCC)    Family history of colon cancer    Hearing loss    History of radiation therapy 04/07/17-04/30/17   viaginal cuff treated to 30  Gy in 5 fractions   History of radiation therapy 04/07/17,04/14/17,04/21/17,04/30/17   endometrial- Dr. Shannon    SEBORRHEIC KERATOSIS, INFLAMED 03/21/2008   SENILE VAGINITIS 01/17/2008   Past Surgical History:  Procedure Laterality Date   BREAST BIOPSY     COSMETIC SURGERY     DILATION AND CURETTAGE OF UTERUS     EYELID IMPLANT     right and left   RHINOPLASTY     ROBOTIC ASSISTED TOTAL HYSTERECTOMY WITH BILATERAL SALPINGO OOPHERECTOMY  Bilateral 02/24/2017   Procedure: XI ROBOTIC ASSISTED TOTAL HYSTERECTOMY WITH BILATERAL SALPINGO OOPHORECTOMY;  Surgeon: Elenore Simmonds, MD;  Location: [facility];  Service: Gynecology;  Laterality: Bilateral;   SENTINEL NODE BIOPSY N/A 02/24/2017   Procedure: SENTINEL NODE BIOPSY;  Surgeon: Elenore Simmonds, MD;  Location: [facility];  Service: Gynecology;  Laterality: N/A;   TONSILECTOMY/ADENOIDECTOMY WITH MYRINGOTOMY     Family History  Problem Relation Age of Onset   Colon cancer Mother 77   Heart disease Father    Anuerysm Sister    COPD Sister    Esophageal cancer Neg Hx    Stomach cancer Neg Hx    Breast cancer Neg Hx    Social History   Socioeconomic History   Marital status: Married    Spouse name: Ozell   Number of children: 2   Years of education: Not on file   Highest education level: 12th grade  Occupational History   Occupation: Research officer, 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  Other Topics Concern   Not on file  Social History Narrative   Married for 15 years   Two children ( both live locally)    Retired - Arts administrator - Advertising account executive       She likes to travel    Social Drivers of Corporate investment banker Strain: Low Risk  (06/21/2024)   Overall Financial Resource Strain (CARDIA)    Difficulty of Paying Living Expenses: Not hard at all  Food Insecurity: No Food Insecurity (06/21/2024)   Hunger Vital Sign    Worried About Running Out of Food in the Last Year: Never true    Ran Out of Food in the Last Year: Never true  Transportation Needs: No Transportation Needs (07/01/2024)   PRAPARE -  Administrator, Civil Service (Medical): No    Lack of Transportation (Non-Medical): No  Physical Activity: Insufficiently Active (06/21/2024)   Exercise Vital Sign    Days of Exercise per Week: 1 day    Minutes of Exercise per Session: 30 min  Stress: No Stress Concern Present (07/01/2024)   [employer] of Occupational Health - Occupational Stress Questionnaire    Feeling of Stress: Only a little  Social Connections: Socially Integrated (06/21/2024)   Social Connection and Isolation Panel    Frequency of Communication with Friends and Family: More than three times a week    Frequency of Social Gatherings with Friends and Family: More than three times a week    Attends Religious Services: More than 4 times per year  Active Member of Clubs or Organizations: Yes    Attends Banker Meetings: More than 4 times per year    Marital Status: Married    Tobacco Counseling Counseling given: Not Answered    Clinical Intake:  Pre-visit preparation completed: Yes  Pain : No/denies pain     BMI - recorded: 20.37 Nutritional Status: BMI of 19-24  Normal Nutritional Risks: None Diabetes: No  Lab Results  Component Value Date   HGBA1C 5.4 08/28/2021   HGBA1C 5.6 08/23/2020   HGBA1C 5.5 06/28/2012     How often do you need to have someone help you when you read instructions, pamphlets, or other written materials from your doctor or pharmacy?: 1 - Never What is the last grade level you completed in school?: 12th  Interpreter Needed?: No  Information entered by :: Lolita Libra, CMA   Activities of Daily Living     06/21/2024   11:11 AM  In your present state of health, do you have any difficulty performing the following activities:  Hearing? 0   1  Comment getting cochlear implants  Vision? 0  Difficulty concentrating or making decisions? 0  Walking or climbing stairs? 0  Dressing or bathing? 0  Doing errands, shopping? 0  Preparing Food  and eating ? N  Using the Toilet? N  In the past six months, have you accidently leaked urine? N  Do you have problems with loss of bowel control? N  Managing your Medications? N  Managing your Finances? N  Housekeeping or managing your Housekeeping? N    Patient Care Team: Frann Mabel Mt, DO as PCP - General (Family Medicine) Frutoso Luz, MD as Referring Physician (Allergy) Waylan Cain, MD as Consulting Physician (Ophthalmology) Legrand Victory LITTIE MOULD, MD as Consulting Physician (Gastroenterology) Waylan Cain, MD as Consulting Physician (Ophthalmology)  I have updated your Care Teams any recent Medical Services you may have received from other providers in the past year.     Assessment:   This is a routine wellness examination for Elenore.  Hearing/Vision screen Hearing Screening - Comments:: In process of getting cochlear implants.  Vision Screening - Comments:: Wears RX glasses -- up to date with routine eye exams with Dr Waylan in [place]. Has prism  lense for double vision via [facility].    Goals Addressed   None    Depression Screen     07/01/2024   10:32 AM 09/08/2023    1:05 PM 05/20/2023    4:01 PM 09/02/2022    8:46 AM 03/14/2022    3:50 PM 03/04/2022    9:20 AM 08/28/2021    8:20 AM  PHQ 2/9 Scores  PHQ - 2 Score 0 0 0 0 0 0 0  PHQ- 9 Score   0 0       Fall Risk     06/21/2024   11:11 AM 09/08/2023    1:05 PM 05/20/2023    4:06 PM 09/02/2022    8:46 AM 03/14/2022    3:52 PM  Fall Risk   Falls in the past year? 0 0 0 0 0  Number falls in past yr: 0 0 0 0 0  Injury with Fall? 0 0 0 0 0  Risk for fall due to : Impaired vision  No Fall Risks No Fall Risks No Fall Risks  Follow up Education provided Falls evaluation completed Falls prevention discussed Falls evaluation completed  Falls prevention discussed      Data saved with a previous flowsheet  row definition    MEDICARE RISK AT HOME:  Medicare Risk at Home Any stairs in or around the  home?: (Patient-Rptd) No Home free of loose throw rugs in walkways, pet beds, electrical cords, etc?: (Patient-Rptd) Yes Adequate lighting in your home to reduce risk of falls?: (Patient-Rptd) Yes Life alert?: (Patient-Rptd) No Use of a cane, walker or w/c?: (Patient-Rptd) No Grab bars in the bathroom?: (Patient-Rptd) Yes Shower chair or bench in shower?: (Patient-Rptd) No Elevated toilet seat or a handicapped toilet?: (Patient-Rptd) No  TIMED UP AND GO:  Was the test performed?  No,audio  Cognitive Function: 6CIT completed        07/01/2024   10:34 AM 05/20/2023    4:08 PM 03/14/2022    3:54 PM  6CIT Screen  What Year? 0 points 0 points 0 points  What month? 0 points 0 points 0 points  What time? 0 points 0 points 0 points  Count back from 20 0 points 0 points 0 points  Months in reverse 0 points 0 points 0 points  Repeat phrase 0 points 0 points 0 points  Total Score 0 points 0 points 0 points    Immunizations Immunization History  Administered Date(s) Administered   Fluad Quad(high Dose 65+) 08/09/2019, 08/23/2020, 08/28/2021   Fluad Trivalent(High Dose 65+) 09/08/2023   Influenza Split 08/26/2011, 08/26/2012, 08/05/2013   Influenza Whole 11/10/2005, 09/17/2007, 08/21/2008, 08/13/2009, 08/07/2010   Influenza, High Dose Seasonal PF 09/06/2014, 09/03/2015, 07/22/2016, 07/24/2016, 08/07/2018, 01/07/2022, 08/30/2022   Influenza-Unspecified 08/17/2017, 08/11/2022   PFIZER(Purple Top)SARS-COV-2 Vaccination 11/18/2019, 12/09/2019, 07/03/2020   Pfizer Covid-19 Vaccine Bivalent Booster 1yrs & up 10/08/2021   Pneumococcal Conjugate-13 03/14/2014   Pneumococcal Polysaccharide-23 11/11/2003, 01/25/2009   Td 11/10/2000   Tdap 02/13/2011, 05/07/2021   Zoster Recombinant(Shingrix) 08/07/2018, 08/25/2019   Zoster, Live 05/17/2010    Screening Tests Health Maintenance  Topic Date Due   COVID-19 Vaccine (5 - 2024-25 season) 07/12/2023   DEXA SCAN  08/30/2023   Medicare Annual  Wellness (AWV)  05/19/2024   INFLUENZA VACCINE  06/10/2024   MAMMOGRAM  02/08/2025 (Originally 06/03/2023)   DTaP/Tdap/Td (4 - Td or Tdap) 05/08/2031   Pneumococcal Vaccine: 50+ Years  Completed   Zoster Vaccines- Shingrix  Completed   HPV VACCINES  Aged Out   Meningococcal B Vaccine  Aged Out    Health Maintenance  Health Maintenance Due  Topic Date Due   COVID-19 Vaccine (5 - 2024-25 season) 07/12/2023   DEXA SCAN  08/30/2023   Medicare Annual Wellness (AWV)  05/19/2024   INFLUENZA VACCINE  06/10/2024   Health Maintenance Items Addressed: Will get flu vaccine at pharmacy, declines COVID vaccine. Waiting to get mammogram in April next year. DEXA ordered.  Additional Screening:  Vision Screening: Recommended annual ophthalmology exams for early detection of glaucoma and other disorders of the eye. Would you like a referral to an eye doctor? No    Dental Screening: Recommended annual dental exams for proper oral hygiene  Community Resource Referral / Chronic Care Management: CRR required this visit?  No   CCM required this visit?  No   Plan:    I have personally reviewed and noted the following in the patient's chart:   Medical and social history Use of alcohol, tobacco or illicit drugs  Current medications and supplements including opioid prescriptions. Patient is not currently taking opioid prescriptions. Functional ability and status Nutritional status Physical activity Advanced directives List of other physicians Hospitalizations, surgeries, and ER visits in previous 12 months Vitals Screenings  to include cognitive, depression, and falls Referrals and appointments  In addition, I have reviewed and discussed with patient certain preventive protocols, quality metrics, and best practice recommendations. A written personalized care plan for preventive services as well as general preventive health recommendations were provided to patient.   Lolita Libra,  CMA   07/01/2024   After Visit Summary: (MyChart) Due to this being a telephonic visit, the after visit summary with patients personalized plan was offered to patient via MyChart   Notes: Nothing significant to report at this time.

## 2024-07-01 NOTE — Patient Instructions (Addendum)
 Ms. Sinor , Thank you for taking time out of your busy schedule to complete your Annual Wellness Visit with me. I enjoyed our conversation and look forward to speaking with you again next year. I, as well as your care team,  appreciate your ongoing commitment to your health goals. Please review the following plan we discussed and let me know if I can assist you in the future. Your Game plan/ To Do List   Referrals: If you haven't heard from the office you've been referred to, please reach out to them at the phone provided.   [facility]:  [phone]  Follow up Visits:  Next Medicare AWV with our clinical staff: 07/04/25 10:20am, telephone    Next Office Visit with your provider: 05/22/25 10am, Dr Frann  Clinician Recommendations:  Aim for 30 minutes of exercise or brisk walking, 6-8 glasses of water , and 5 servings of fruits and vegetables each day.   You will need to get the following vaccines at your local pharmacy: Flu      This is a list of the screening recommended for you and due dates:  Health Maintenance  Topic Date Due   COVID-19 Vaccine (5 - 2024-25 season) 07/12/2023   DEXA scan (bone density measurement)  08/30/2023   Medicare Annual Wellness Visit  05/19/2024   Flu Shot  06/10/2024   Mammogram  02/08/2025*   DTaP/Tdap/Td vaccine (4 - Td or Tdap) 05/08/2031   Pneumococcal Vaccine for age over 58  Completed   Zoster (Shingles) Vaccine  Completed   HPV Vaccine  Aged Out   Meningitis B Vaccine  Aged Out  *Topic was postponed. The date shown is not the original due date.    Advanced directives: (Copy Requested) Please bring a copy of your health care power of attorney and living will to the office to be added to your chart at your convenience. You can mail to [facility] [address] [place], [place] [address] or email to [email or web address] Advance Care Planning is important because it:  [x]  Makes sure you receive  the medical care that is consistent with your values, goals, and preferences  [x]  It provides guidance to your family and loved ones and reduces their decisional burden about whether or not they are making the right decisions based on your wishes.  Follow the link provided in your after visit summary or read over the paperwork we have mailed to you to help you started getting your Advance Directives in place. If you need assistance in completing these, please reach out to us  so that we can help you!  See attachments for Preventive Care and Fall Prevention Tips.

## 2024-07-04 DIAGNOSIS — J3089 Other allergic rhinitis: Secondary | ICD-10-CM | POA: Diagnosis not present

## 2024-07-04 DIAGNOSIS — J301 Allergic rhinitis due to pollen: Secondary | ICD-10-CM | POA: Diagnosis not present

## 2024-07-04 DIAGNOSIS — J3081 Allergic rhinitis due to animal (cat) (dog) hair and dander: Secondary | ICD-10-CM | POA: Diagnosis not present

## 2024-07-12 DIAGNOSIS — J301 Allergic rhinitis due to pollen: Secondary | ICD-10-CM | POA: Diagnosis not present

## 2024-07-12 DIAGNOSIS — J3089 Other allergic rhinitis: Secondary | ICD-10-CM | POA: Diagnosis not present

## 2024-07-12 DIAGNOSIS — J3081 Allergic rhinitis due to animal (cat) (dog) hair and dander: Secondary | ICD-10-CM | POA: Diagnosis not present

## 2024-07-18 DIAGNOSIS — J3089 Other allergic rhinitis: Secondary | ICD-10-CM | POA: Diagnosis not present

## 2024-07-18 DIAGNOSIS — J301 Allergic rhinitis due to pollen: Secondary | ICD-10-CM | POA: Diagnosis not present

## 2024-07-18 DIAGNOSIS — J3081 Allergic rhinitis due to animal (cat) (dog) hair and dander: Secondary | ICD-10-CM | POA: Diagnosis not present

## 2024-07-25 DIAGNOSIS — J301 Allergic rhinitis due to pollen: Secondary | ICD-10-CM | POA: Diagnosis not present

## 2024-07-25 DIAGNOSIS — J3081 Allergic rhinitis due to animal (cat) (dog) hair and dander: Secondary | ICD-10-CM | POA: Diagnosis not present

## 2024-07-25 DIAGNOSIS — J3089 Other allergic rhinitis: Secondary | ICD-10-CM | POA: Diagnosis not present

## 2024-08-01 DIAGNOSIS — J3081 Allergic rhinitis due to animal (cat) (dog) hair and dander: Secondary | ICD-10-CM | POA: Diagnosis not present

## 2024-08-01 DIAGNOSIS — J301 Allergic rhinitis due to pollen: Secondary | ICD-10-CM | POA: Diagnosis not present

## 2024-08-01 DIAGNOSIS — J3089 Other allergic rhinitis: Secondary | ICD-10-CM | POA: Diagnosis not present

## 2024-08-09 DIAGNOSIS — J3089 Other allergic rhinitis: Secondary | ICD-10-CM | POA: Diagnosis not present

## 2024-08-09 DIAGNOSIS — J301 Allergic rhinitis due to pollen: Secondary | ICD-10-CM | POA: Diagnosis not present

## 2024-08-14 ENCOUNTER — Emergency Department (HOSPITAL_COMMUNITY)

## 2024-08-14 ENCOUNTER — Encounter (HOSPITAL_COMMUNITY): Payer: Self-pay | Admitting: Emergency Medicine

## 2024-08-14 ENCOUNTER — Emergency Department (HOSPITAL_COMMUNITY)
Admission: EM | Admit: 2024-08-14 | Discharge: 2024-08-14 | Disposition: A | Discharge status: Home / Self Care | Attending: Emergency Medicine | Admitting: Emergency Medicine

## 2024-08-14 DIAGNOSIS — Z8542 Personal history of malignant neoplasm of other parts of uterus: Secondary | ICD-10-CM | POA: Insufficient documentation

## 2024-08-14 DIAGNOSIS — M79605 Pain in left leg: Secondary | ICD-10-CM | POA: Diagnosis not present

## 2024-08-14 DIAGNOSIS — M79662 Pain in left lower leg: Secondary | ICD-10-CM | POA: Diagnosis not present

## 2024-08-14 LAB — CBC WITH DIFFERENTIAL/PLATELET
Abs Immature Granulocytes: 0.02 K/uL (ref 0.00–0.07)
Basophils Absolute: 0 K/uL (ref 0.0–0.1)
Basophils Relative: 1 %
Eosinophils Absolute: 0.1 K/uL (ref 0.0–0.5)
Eosinophils Relative: 2 %
HCT: 33.7 % — ABNORMAL LOW (ref 36.0–46.0)
Hemoglobin: 10.7 g/dL — ABNORMAL LOW (ref 12.0–15.0)
Immature Granulocytes: 0 %
Lymphocytes Relative: 14 %
Lymphs Abs: 0.7 K/uL (ref 0.7–4.0)
MCH: 30.8 pg (ref 26.0–34.0)
MCHC: 31.8 g/dL (ref 30.0–36.0)
MCV: 97.1 fL (ref 80.0–100.0)
Monocytes Absolute: 0.4 K/uL (ref 0.1–1.0)
Monocytes Relative: 7 %
Neutro Abs: 3.9 K/uL (ref 1.7–7.7)
Neutrophils Relative %: 76 %
Platelets: 211 K/uL (ref 150–400)
RBC: 3.47 MIL/uL — ABNORMAL LOW (ref 3.87–5.11)
RDW: 12.8 % (ref 11.5–15.5)
WBC: 5.1 K/uL (ref 4.0–10.5)
nRBC: 0 % (ref 0.0–0.2)

## 2024-08-14 LAB — BASIC METABOLIC PANEL WITH GFR
Anion gap: 10 (ref 5–15)
BUN: 27 mg/dL — ABNORMAL HIGH (ref 8–23)
CO2: 25 mmol/L (ref 22–32)
Calcium: 9.6 mg/dL (ref 8.9–10.3)
Chloride: 102 mmol/L (ref 98–111)
Creatinine, Ser: 1.32 mg/dL — ABNORMAL HIGH (ref 0.44–1.00)
GFR, Estimated: 39 mL/min — ABNORMAL LOW (ref >60)
Glucose, Bld: 88 mg/dL (ref 70–99)
Potassium: 4.6 mmol/L (ref 3.5–5.1)
Sodium: 138 mmol/L (ref 135–145)

## 2024-08-14 MED ORDER — SODIUM CHLORIDE 0.9 % IV BOLUS
1000.0000 mL | Freq: Once | INTRAVENOUS | Status: AC
  Administered 2024-08-14: 1000 mL via INTRAVENOUS

## 2024-08-14 NOTE — ED Provider Notes (Signed)
 [place] EMERGENCY DEPARTMENT AT [facility] Provider Note   CSN: [id number] Arrival date & time: 08/14/24  9093     Patient presents with: Leg Pain (Left lower)   Sheila Armstrong is a 89 y.o. female with a past medical history significant for history of endometrial cancer who presents to the ED due to left lower extremity pain.  Patient has a cramping sensation in her left calf that started 2 days ago.  Denies any history of blood clots, recent surgeries, recent long immobilizations. Does have a history of endometrial cancer.  Denies chest pain and shortness of breath.  Denies any lower extremity edema.  No injury.  Still able to ambulate.  History obtained from patient and past medical records. No interpreter used during encounter.       Prior to Admission medications   Medication Sig Start Date End Date Taking? Authorizing Provider  alendronate  (FOSAMAX ) 70 MG tablet Take 1 tablet (70 mg total) by mouth every 7 (seven) days. Take with a full glass of water  on an empty stomach. 09/08/23   Frann Mabel Mt, DO  calcium-vitamin D  (OSCAL WITH D) 500-200 MG-UNIT tablet Take 1 tablet by mouth daily with breakfast.    [provider]  Cyanocobalamin  (VITAMIN B12 PO)     [provider]  dicyclomine  (BENTYL ) 10 MG capsule Take 1 tab every 6 hours as needed for abdominal cramping. 09/08/23   Frann Mabel Mt, DO  EPINEPHrine 0.3 mg/0.3 mL IJ SOAJ injection  11/13/16   [provider]  fluticasone (FLONASE) 50 MCG/ACT nasal spray     [provider]  Multiple Vitamin (MULTIVITAMIN) capsule Take 1 capsule by mouth daily.    [provider]  NON FORMULARY Allergy Injections- weekly    [provider]  Probiotic Product (ALIGN) 4 MG CAPS Take 1 tablet by mouth daily. 05/04/12   Esterwood, Amy S, PA-C  VITAMIN D  PO     [provider]    Allergies: Amoxicillin , Sulfonamide derivatives, Sulfur, Thimerosal  (thiomersal), and Sudafed [pseudoephedrine]    Review of Systems  Respiratory:  Negative for shortness of breath.   Cardiovascular:  Negative for chest pain.  Musculoskeletal:  Positive for arthralgias. Negative for back pain.    Updated Vital Signs BP 117/73   Pulse 74   Temp 98.2 F (36.8 C)   Resp 16   SpO2 100%   Physical Exam Vitals and nursing note reviewed.  Constitutional:      General: She is not in acute distress.    Appearance: She is not ill-appearing.  HENT:     Head: Normocephalic.  Eyes:     Pupils: Pupils are equal, round, and reactive to light.  Cardiovascular:     Rate and Rhythm: Normal rate and regular rhythm.     Pulses: Normal pulses.     Heart sounds: Normal heart sounds. No murmur heard.    No friction rub. No gallop.  Pulmonary:     Effort: Pulmonary effort is normal.     Breath sounds: Normal breath sounds.  Abdominal:     General: Abdomen is flat. There is no distension.     Palpations: Abdomen is soft.     Tenderness: There is no abdominal tenderness. There is no guarding or rebound.  Musculoskeletal:        General: Normal range of motion.     Cervical back: Neck supple.     Comments: No lower extremity edema. No calf tenderness. Pedal  pulses palpable.   Skin:    General: Skin is warm and dry.  Neurological:     General: No focal deficit present.     Mental Status: She is alert.  Psychiatric:        Mood and Affect: Mood normal.        Behavior: Behavior normal.     (all labs ordered are listed, but only abnormal results are displayed) Labs Reviewed  CBC WITH DIFFERENTIAL/PLATELET - Abnormal; Notable for the following components:      Result Value   RBC 3.47 (*)    Hemoglobin 10.7 (*)    HCT 33.7 (*)    All other components within normal limits  BASIC METABOLIC PANEL WITH GFR - Abnormal; Notable for the following components:   BUN 27 (*)    Creatinine, Ser 1.32 (*)    GFR, Estimated 39 (*)    All other components within  normal limits    EKG: None  Radiology: VAS US  LOWER EXTREMITY VENOUS (DVT) (7a-7p) Result Date: 08/14/2024  Lower Venous DVT Study Patient Name:  Sheila Armstrong  Date of Exam:   08/14/2024 Medical Rec #: [id number]          Accession #:    [id number] Date of Birth: 1935-07-10          Patient Gender: F Patient Age:   17 years Exam Location:  [facility] Procedure:      VAS US  LOWER EXTREMITY VENOUS (DVT) Referring Phys: Aodhan Scheidt --------------------------------------------------------------------------------  Indications: Left leg cramping.  Comparison Study: Previous exam on 02/27/2017 was negative for DVT Performing Technologist: Ezzie Potters RVT, RDMS  Examination Guidelines: A complete evaluation includes B-mode imaging, spectral Doppler, color Doppler, and power Doppler as needed of all accessible portions of each vessel. Bilateral testing is considered an integral part of a complete examination. Limited examinations for reoccurring indications may be performed as noted. The reflux portion of the exam is performed with the patient in reverse Trendelenburg.  +-----+---------------+---------+-----------+----------+--------------+ RIGHTCompressibilityPhasicitySpontaneityPropertiesThrombus Aging +-----+---------------+---------+-----------+----------+--------------+ CFV  Full           Yes      Yes                                 +-----+---------------+---------+-----------+----------+--------------+   +---------+---------------+---------+-----------+----------+--------------+ LEFT     CompressibilityPhasicitySpontaneityPropertiesThrombus Aging +---------+---------------+---------+-----------+----------+--------------+ CFV      Full           Yes      Yes                                 +---------+---------------+---------+-----------+----------+--------------+ SFJ      Full                                                         +---------+---------------+---------+-----------+----------+--------------+ FV Prox  Full           Yes      Yes                                 +---------+---------------+---------+-----------+----------+--------------+ FV Mid   Full  Yes      Yes                                 +---------+---------------+---------+-----------+----------+--------------+ FV DistalFull           Yes      Yes                                 +---------+---------------+---------+-----------+----------+--------------+ PFV      Full                                                        +---------+---------------+---------+-----------+----------+--------------+ POP      Full           Yes      Yes                                 +---------+---------------+---------+-----------+----------+--------------+ PTV      Full                                                        +---------+---------------+---------+-----------+----------+--------------+ PERO     Full                                                        +---------+---------------+---------+-----------+----------+--------------+     Summary: RIGHT: - No evidence of common femoral vein obstruction.   LEFT: - There is no evidence of deep vein thrombosis in the lower extremity.  - No cystic structure found in the popliteal fossa.  *See table(s) above for measurements and observations.    Preliminary      Procedures   Medications Ordered in the ED  sodium chloride  0.9 % bolus 1,000 mL (0 mLs Intravenous Stopped 08/14/24 1345)                                    Medical Decision Making Amount and/or Complexity of Data Reviewed Labs: ordered. Decision-making details documented in ED Course.   This patient presents to the ED for concern of calf pain, this involves an extensive number of treatment options, and is a complaint that carries with it a high risk of complications and morbidity.  The differential  diagnosis includes DVT, bony fracture, electrolyte abnormality, infection, etc  89 year old female presents to the ED due to cramping sensation in left calf x 2 days.  No history of blood clots.  Has a history of endometrial cancer.  No chest pain or shortness of breath.  Upon arrival patient afebrile, not tachycardic or hypoxic.  Patient well-appearing on exam.  Has no lower extremity edema.  No calf tenderness on exam.  Left lower extremity neurovascularly intact with soft compartments.  Routine labs ordered to rule out electrolyte abnormalities.  Ultrasound to rule out DVT.  No injury to suggest bony fracture so will hold off on x-ray at this time.  Patient declined any pain medication. No evidence of cellulitis on exam. Pulses palpable, low suspicion for arterial occlusion.   CBC with no leukocytosis.  Anemia with hemoglobin at 10.7 appears to be around patient's baseline.  BMP with elevated creatinine at 1.32 and BUN at 27. IVFs started. US  negative for DVT. Unclear what is causing patient's cramp in left leg; however no evidence of infection on exam. No major electrolyte abnormalities. No DVT. Advised patient to follow-up with PCP for recheck early this week. Patient stable for discharge. Strict ED precautions discussed with patient. Patient states understanding and agrees to plan. Patient discharged home in no acute distress and stable vitals  Co morbidities that complicate the patient evaluation  Hx endometrial cancer  Social Determinants of Health:  Elderly >65      Final diagnoses:  Left leg pain    ED Discharge Orders     None          Lorelle Aleck JAYSON DEVONNA 08/14/24 1348    Freddi Hamilton, MD 08/15/24 [id number]

## 2024-08-14 NOTE — ED Triage Notes (Signed)
 Pt arriving POV with concern for left lower leg pain. Pt reports she had a cramp in her left leg Friday evening and can still feel residual discomfort from it. Pt able to walk without issue. Area is not red or swollen but pt wants to make sure there isn't a blood clot.

## 2024-08-14 NOTE — Discharge Instructions (Addendum)
 It was a pleasure taking care of you today.  As discussed, your ultrasound did not show evidence of a blood clot.  Your labs are reassuring.  You may take over-the-counter ibuprofen  or Tylenol  as needed for pain.  Please follow-up with PCP if symptoms do not improve. Return to the ER for new or worsening symptoms.

## 2024-08-14 NOTE — Progress Notes (Signed)
 LLE venous duplex has been completed.  Preliminary results given to Caroline Aberman, PA-C.   Results can be found under chart review under CV PROC. 08/14/2024 11:54 AM Strummer Canipe RVT, RDMS

## 2024-08-15 ENCOUNTER — Ambulatory Visit (HOSPITAL_BASED_OUTPATIENT_CLINIC_OR_DEPARTMENT_OTHER)
Admission: RE | Admit: 2024-08-15 | Discharge: 2024-08-15 | Disposition: A | Discharge status: Home / Self Care | Source: Ambulatory Visit | Attending: Family Medicine | Admitting: Family Medicine

## 2024-08-15 ENCOUNTER — Other Ambulatory Visit (HOSPITAL_BASED_OUTPATIENT_CLINIC_OR_DEPARTMENT_OTHER)

## 2024-08-15 ENCOUNTER — Ambulatory Visit: Payer: Self-pay | Admitting: Family Medicine

## 2024-08-15 DIAGNOSIS — J3081 Allergic rhinitis due to animal (cat) (dog) hair and dander: Secondary | ICD-10-CM | POA: Diagnosis not present

## 2024-08-15 DIAGNOSIS — J301 Allergic rhinitis due to pollen: Secondary | ICD-10-CM | POA: Diagnosis not present

## 2024-08-15 DIAGNOSIS — J3089 Other allergic rhinitis: Secondary | ICD-10-CM | POA: Diagnosis not present

## 2024-08-15 DIAGNOSIS — M810 Age-related osteoporosis without current pathological fracture: Secondary | ICD-10-CM | POA: Insufficient documentation

## 2024-08-15 DIAGNOSIS — Z780 Asymptomatic menopausal state: Secondary | ICD-10-CM | POA: Diagnosis not present

## 2024-08-16 ENCOUNTER — Encounter: Payer: Self-pay | Admitting: Family Medicine

## 2024-08-17 ENCOUNTER — Other Ambulatory Visit: Payer: Self-pay | Admitting: Family Medicine

## 2024-08-17 DIAGNOSIS — J3089 Other allergic rhinitis: Secondary | ICD-10-CM | POA: Diagnosis not present

## 2024-08-17 DIAGNOSIS — J301 Allergic rhinitis due to pollen: Secondary | ICD-10-CM | POA: Diagnosis not present

## 2024-08-22 ENCOUNTER — Inpatient Hospital Stay: Admitting: Family Medicine

## 2024-08-22 DIAGNOSIS — J301 Allergic rhinitis due to pollen: Secondary | ICD-10-CM | POA: Diagnosis not present

## 2024-08-22 DIAGNOSIS — J3081 Allergic rhinitis due to animal (cat) (dog) hair and dander: Secondary | ICD-10-CM | POA: Diagnosis not present

## 2024-08-22 DIAGNOSIS — J3089 Other allergic rhinitis: Secondary | ICD-10-CM | POA: Diagnosis not present

## 2024-08-23 DIAGNOSIS — L309 Dermatitis, unspecified: Secondary | ICD-10-CM | POA: Diagnosis not present

## 2024-08-30 ENCOUNTER — Ambulatory Visit (INDEPENDENT_AMBULATORY_CARE_PROVIDER_SITE_OTHER)

## 2024-08-30 DIAGNOSIS — Z23 Encounter for immunization: Secondary | ICD-10-CM | POA: Diagnosis not present

## 2024-09-05 DIAGNOSIS — J3081 Allergic rhinitis due to animal (cat) (dog) hair and dander: Secondary | ICD-10-CM | POA: Diagnosis not present

## 2024-09-05 DIAGNOSIS — J301 Allergic rhinitis due to pollen: Secondary | ICD-10-CM | POA: Diagnosis not present

## 2024-09-05 DIAGNOSIS — J3089 Other allergic rhinitis: Secondary | ICD-10-CM | POA: Diagnosis not present

## 2024-09-07 DIAGNOSIS — H524 Presbyopia: Secondary | ICD-10-CM | POA: Diagnosis not present

## 2024-09-07 DIAGNOSIS — H26493 Other secondary cataract, bilateral: Secondary | ICD-10-CM | POA: Diagnosis not present

## 2024-09-07 DIAGNOSIS — H532 Diplopia: Secondary | ICD-10-CM | POA: Diagnosis not present

## 2024-09-12 DIAGNOSIS — J301 Allergic rhinitis due to pollen: Secondary | ICD-10-CM | POA: Diagnosis not present

## 2024-09-12 DIAGNOSIS — J3081 Allergic rhinitis due to animal (cat) (dog) hair and dander: Secondary | ICD-10-CM | POA: Diagnosis not present

## 2024-09-12 DIAGNOSIS — J3089 Other allergic rhinitis: Secondary | ICD-10-CM | POA: Diagnosis not present

## 2024-09-19 DIAGNOSIS — J3081 Allergic rhinitis due to animal (cat) (dog) hair and dander: Secondary | ICD-10-CM | POA: Diagnosis not present

## 2024-09-19 DIAGNOSIS — J301 Allergic rhinitis due to pollen: Secondary | ICD-10-CM | POA: Diagnosis not present

## 2024-09-19 DIAGNOSIS — J3089 Other allergic rhinitis: Secondary | ICD-10-CM | POA: Diagnosis not present

## 2024-09-20 DIAGNOSIS — H903 Sensorineural hearing loss, bilateral: Secondary | ICD-10-CM | POA: Diagnosis not present

## 2024-09-20 DIAGNOSIS — Z974 Presence of external hearing-aid: Secondary | ICD-10-CM | POA: Diagnosis not present

## 2024-09-20 DIAGNOSIS — H73892 Other specified disorders of tympanic membrane, left ear: Secondary | ICD-10-CM | POA: Diagnosis not present

## 2024-09-26 DIAGNOSIS — J3081 Allergic rhinitis due to animal (cat) (dog) hair and dander: Secondary | ICD-10-CM | POA: Diagnosis not present

## 2024-09-26 DIAGNOSIS — L820 Inflamed seborrheic keratosis: Secondary | ICD-10-CM | POA: Diagnosis not present

## 2024-09-26 DIAGNOSIS — L821 Other seborrheic keratosis: Secondary | ICD-10-CM | POA: Diagnosis not present

## 2024-09-26 DIAGNOSIS — J3089 Other allergic rhinitis: Secondary | ICD-10-CM | POA: Diagnosis not present

## 2024-09-26 DIAGNOSIS — J301 Allergic rhinitis due to pollen: Secondary | ICD-10-CM | POA: Diagnosis not present

## 2024-10-03 DIAGNOSIS — J301 Allergic rhinitis due to pollen: Secondary | ICD-10-CM | POA: Diagnosis not present

## 2024-10-03 DIAGNOSIS — J3089 Other allergic rhinitis: Secondary | ICD-10-CM | POA: Diagnosis not present

## 2024-10-03 DIAGNOSIS — J3081 Allergic rhinitis due to animal (cat) (dog) hair and dander: Secondary | ICD-10-CM | POA: Diagnosis not present

## 2024-10-10 DIAGNOSIS — J3081 Allergic rhinitis due to animal (cat) (dog) hair and dander: Secondary | ICD-10-CM | POA: Diagnosis not present

## 2024-10-10 DIAGNOSIS — J3089 Other allergic rhinitis: Secondary | ICD-10-CM | POA: Diagnosis not present

## 2024-10-10 DIAGNOSIS — J301 Allergic rhinitis due to pollen: Secondary | ICD-10-CM | POA: Diagnosis not present

## 2024-10-18 DIAGNOSIS — J3089 Other allergic rhinitis: Secondary | ICD-10-CM | POA: Diagnosis not present

## 2024-10-18 DIAGNOSIS — J301 Allergic rhinitis due to pollen: Secondary | ICD-10-CM | POA: Diagnosis not present

## 2024-10-18 DIAGNOSIS — J3081 Allergic rhinitis due to animal (cat) (dog) hair and dander: Secondary | ICD-10-CM | POA: Diagnosis not present

## 2025-03-07 ENCOUNTER — Encounter: Admitting: Obstetrics and Gynecology

## 2025-05-22 ENCOUNTER — Encounter: Admitting: Family Medicine

## 2025-07-04 ENCOUNTER — Ambulatory Visit
